# Patient Record
Sex: Female | Born: 1935
Health system: Southern US, Community
[De-identification: ages and names within clinical notes are randomized; demographics above are authoritative.]

## PROBLEM LIST (undated history)

## (undated) DIAGNOSIS — F329 Major depressive disorder, single episode, unspecified: Secondary | ICD-10-CM

## (undated) DIAGNOSIS — Z860100 Personal history of colon polyps, unspecified: Secondary | ICD-10-CM

## (undated) DIAGNOSIS — C439 Malignant melanoma of skin, unspecified: Secondary | ICD-10-CM

## (undated) DIAGNOSIS — M4800 Spinal stenosis, site unspecified: Secondary | ICD-10-CM

## (undated) DIAGNOSIS — Z923 Personal history of irradiation: Secondary | ICD-10-CM

## (undated) DIAGNOSIS — E7800 Pure hypercholesterolemia, unspecified: Secondary | ICD-10-CM

## (undated) DIAGNOSIS — N9489 Other specified conditions associated with female genital organs and menstrual cycle: Secondary | ICD-10-CM

## (undated) DIAGNOSIS — M1990 Unspecified osteoarthritis, unspecified site: Secondary | ICD-10-CM

## (undated) DIAGNOSIS — F32A Depression, unspecified: Secondary | ICD-10-CM

## (undated) DIAGNOSIS — N949 Unspecified condition associated with female genital organs and menstrual cycle: Secondary | ICD-10-CM

## (undated) DIAGNOSIS — I10 Essential (primary) hypertension: Secondary | ICD-10-CM

## (undated) DIAGNOSIS — C50919 Malignant neoplasm of unspecified site of unspecified female breast: Secondary | ICD-10-CM

## (undated) HISTORY — DX: Unspecified condition associated with female genital organs and menstrual cycle: N94.9

## (undated) HISTORY — DX: Essential (primary) hypertension: I10

## (undated) HISTORY — DX: Spinal stenosis, site unspecified: M48.00

## (undated) HISTORY — DX: Other specified conditions associated with female genital organs and menstrual cycle: N94.89

## (undated) HISTORY — DX: Pure hypercholesterolemia, unspecified: E78.00

## (undated) HISTORY — DX: Major depressive disorder, single episode, unspecified: F32.9

## (undated) HISTORY — DX: Unspecified osteoarthritis, unspecified site: M19.90

## (undated) HISTORY — DX: Depression, unspecified: F32.A

## (undated) HISTORY — DX: Personal history of colon polyps, unspecified: Z86.0100

## (undated) HISTORY — DX: Personal history of colonic polyps: Z86.010

## (undated) HISTORY — PX: EYE SURGERY: SHX253

## (undated) HISTORY — DX: Malignant neoplasm of unspecified site of unspecified female breast: C50.919

---

## 1993-03-08 DIAGNOSIS — C50919 Malignant neoplasm of unspecified site of unspecified female breast: Secondary | ICD-10-CM

## 1993-03-08 HISTORY — DX: Malignant neoplasm of unspecified site of unspecified female breast: C50.919

## 1993-03-08 HISTORY — PX: BREAST LUMPECTOMY: SHX2

## 1993-03-08 HISTORY — PX: BREAST BIOPSY: SHX20

## 2003-12-09 ENCOUNTER — Ambulatory Visit: Payer: Self-pay | Admitting: Internal Medicine

## 2004-03-08 HISTORY — PX: BREAST BIOPSY: SHX20

## 2004-06-03 ENCOUNTER — Ambulatory Visit: Payer: Self-pay | Admitting: Internal Medicine

## 2004-06-09 ENCOUNTER — Ambulatory Visit: Payer: Self-pay | Admitting: Surgery

## 2004-06-15 ENCOUNTER — Ambulatory Visit: Payer: Self-pay | Admitting: Surgery

## 2004-11-17 ENCOUNTER — Ambulatory Visit: Payer: Self-pay | Admitting: Surgery

## 2005-06-09 ENCOUNTER — Ambulatory Visit: Payer: Self-pay | Admitting: Surgery

## 2005-06-25 ENCOUNTER — Ambulatory Visit: Payer: Self-pay | Admitting: Unknown Physician Specialty

## 2006-01-05 ENCOUNTER — Ambulatory Visit: Payer: Self-pay | Admitting: Internal Medicine

## 2006-08-01 ENCOUNTER — Ambulatory Visit: Payer: Self-pay | Admitting: Surgery

## 2006-09-12 ENCOUNTER — Ambulatory Visit: Payer: Self-pay | Admitting: Unknown Physician Specialty

## 2007-08-04 ENCOUNTER — Ambulatory Visit: Payer: Self-pay | Admitting: Surgery

## 2007-09-13 ENCOUNTER — Ambulatory Visit: Payer: Self-pay | Admitting: Family Medicine

## 2008-04-05 ENCOUNTER — Inpatient Hospital Stay: Payer: Self-pay | Admitting: Psychiatry

## 2008-08-07 ENCOUNTER — Ambulatory Visit: Payer: Self-pay | Admitting: Surgery

## 2008-08-20 ENCOUNTER — Ambulatory Visit: Payer: Self-pay | Admitting: Surgery

## 2009-02-25 ENCOUNTER — Ambulatory Visit: Payer: Self-pay | Admitting: Surgery

## 2009-08-26 ENCOUNTER — Ambulatory Visit: Payer: Self-pay | Admitting: Surgery

## 2010-09-10 ENCOUNTER — Ambulatory Visit: Payer: Self-pay | Admitting: Surgery

## 2010-09-11 ENCOUNTER — Ambulatory Visit: Payer: Self-pay | Admitting: Surgery

## 2010-12-28 ENCOUNTER — Ambulatory Visit: Payer: Self-pay | Admitting: Surgery

## 2011-10-04 ENCOUNTER — Ambulatory Visit: Payer: Self-pay | Admitting: Family Medicine

## 2011-12-24 ENCOUNTER — Ambulatory Visit: Payer: Self-pay | Admitting: Unknown Physician Specialty

## 2012-10-04 ENCOUNTER — Ambulatory Visit: Payer: Self-pay | Admitting: Family Medicine

## 2012-12-06 ENCOUNTER — Ambulatory Visit (INDEPENDENT_AMBULATORY_CARE_PROVIDER_SITE_OTHER): Payer: Medicare Other | Admitting: Internal Medicine

## 2012-12-06 ENCOUNTER — Encounter: Payer: Self-pay | Admitting: Internal Medicine

## 2012-12-06 VITALS — BP 120/70 | HR 73 | Temp 98.3°F | Ht 62.25 in | Wt 125.5 lb

## 2012-12-06 DIAGNOSIS — Z86010 Personal history of colonic polyps: Secondary | ICD-10-CM

## 2012-12-06 DIAGNOSIS — E7800 Pure hypercholesterolemia, unspecified: Secondary | ICD-10-CM

## 2012-12-06 DIAGNOSIS — I10 Essential (primary) hypertension: Secondary | ICD-10-CM

## 2012-12-06 DIAGNOSIS — F329 Major depressive disorder, single episode, unspecified: Secondary | ICD-10-CM

## 2012-12-06 DIAGNOSIS — C50919 Malignant neoplasm of unspecified site of unspecified female breast: Secondary | ICD-10-CM

## 2012-12-06 DIAGNOSIS — IMO0002 Reserved for concepts with insufficient information to code with codable children: Secondary | ICD-10-CM

## 2012-12-06 DIAGNOSIS — M48061 Spinal stenosis, lumbar region without neurogenic claudication: Secondary | ICD-10-CM

## 2012-12-06 MED ORDER — LOVASTATIN 40 MG PO TABS: 40.0000 mg | ORAL_TABLET | Freq: Every day | ORAL | Status: DC

## 2012-12-09 ENCOUNTER — Encounter: Payer: Self-pay | Admitting: Internal Medicine

## 2012-12-09 DIAGNOSIS — I10 Essential (primary) hypertension: Secondary | ICD-10-CM | POA: Insufficient documentation

## 2012-12-09 DIAGNOSIS — IMO0002 Reserved for concepts with insufficient information to code with codable children: Secondary | ICD-10-CM | POA: Insufficient documentation

## 2012-12-09 DIAGNOSIS — E785 Hyperlipidemia, unspecified: Secondary | ICD-10-CM | POA: Insufficient documentation

## 2012-12-09 DIAGNOSIS — Z853 Personal history of malignant neoplasm of breast: Secondary | ICD-10-CM | POA: Insufficient documentation

## 2012-12-09 DIAGNOSIS — Z86010 Personal history of colonic polyps: Secondary | ICD-10-CM | POA: Insufficient documentation

## 2012-12-09 DIAGNOSIS — F320 Major depressive disorder, single episode, mild: Secondary | ICD-10-CM | POA: Insufficient documentation

## 2012-12-09 DIAGNOSIS — M48061 Spinal stenosis, lumbar region without neurogenic claudication: Secondary | ICD-10-CM | POA: Insufficient documentation

## 2012-12-09 NOTE — Assessment & Plan Note (Signed)
Colonoscopy 2013 - one polyp.  Followed by Dr [name].

## 2012-12-09 NOTE — Assessment & Plan Note (Signed)
S/p lumpectomy and XRT.  Has been released.  Last mammogram 10/04/12 - ok.

## 2012-12-09 NOTE — Assessment & Plan Note (Signed)
Some back pain and stiffness.  Sees Dr Sharlet Salina as needed.  On lyrica and has tramadol if needed.  Follow.

## 2012-12-09 NOTE — Assessment & Plan Note (Signed)
Controlled now on clonazepam and remeron.  Follow.

## 2012-12-09 NOTE — Assessment & Plan Note (Signed)
On lovastatin.  Low cholesterol diet.  Follow lipid panel and liver function.

## 2012-12-09 NOTE — Progress Notes (Signed)
Subjective:    [name], female    DOB: [birth year], 79 y.o.   MRN: [id number]  HPI 79 year old female with past history of hypertension, hypercholesterolemia, breast cancer, depression and lumbar spinal stenosis.  She comes in today to follow up on these issues as well as to establish care.  She was diagnosed with breast cancer 06/18/93.  S/p lumpectomy and XRT.  She also suffered from depression after her husband passed away in 06/18/05.  Previously saw Dr Marjory Lies and Dr Bridgett Larsson.  Was admitted to [facility] for four days.  Takes clonazepam and remeron and this controls things well.  She also has a pinched nerve and has been diagnosed with lumbar spinal stenosis and a bulging disc.  Has had 6 epidurals with no significant relief.  Sees Dr Sharlet Salina as needed.  Takes Lyrica and has tramadol if needed.  She has some back stiffness and this can affect her balance.  She is able to do the things she needs to do at home.  Overall she feels she is doing relatively well.     Past Medical History  Diagnosis Date  . Arthritis   . Breast cancer     1995, s/p XRT  . Depression   . Hypercholesterolemia   . Hypertension   . Hx of colonic polyp   . Spinal stenosis     lumbar    Outpatient Encounter Prescriptions as of 12/06/2012  Medication Sig Dispense Refill  . aspirin 81 MG tablet Take 81 mg by mouth daily.      . calcium-vitamin D (OSCAL WITH D) 500-200 MG-UNIT per tablet Take 2 tablets by mouth daily.      . clonazePAM (KLONOPIN) 0.5 MG tablet Take 0.5 mg by mouth daily as needed for anxiety.      Marland Kitchen lisinopril-hydrochlorothiazide (PRINZIDE,ZESTORETIC) 20-25 MG per tablet Take 1 tablet by mouth daily.      Marland Kitchen lovastatin (MEVACOR) 40 MG tablet Take 1-2 tablets (40-80 mg total) by mouth daily.  180 tablet  3  . mirtazapine (REMERON) 15 MG tablet Take 15 mg by mouth at bedtime.      . Multiple Vitamin (MULTIVITAMIN) tablet Take 1 tablet by mouth daily.      . Omega-3 Fatty Acids (FISH OIL)  1000 MG CAPS Take by mouth daily.      . pregabalin (LYRICA) 50 MG capsule Take 50 mg by mouth daily.      . traMADol (ULTRAM) 50 MG tablet Take 25-50 mg by mouth every 8 (eight) hours as needed for pain.      . [DISCONTINUED] lovastatin (MEVACOR) 40 MG tablet Take 40-80 mg by mouth daily.       No facility-administered encounter medications on file as of 12/06/2012.    Review of Systems Patient denies any headache, lightheadedness or dizziness.  Some minimal nasal stuffiness.  No significant sinus pressure.  No sore throat.  No chest pain, tightness or palpitations.  No increased shortness of breath, cough or congestion.  No nausea or vomiting.  No abdominal pain or cramping.  No bowel change, such as diarrhea, constipation, BRBPR or melana.  No urine change.   Back pain and stiffness as outlined.  Left hip is better.  Takes clonazepam and remeron.   Feels she is doing well on these medications.       Objective:   Physical Exam Filed Vitals:   12/06/12 0857  BP: 120/70  Pulse: 73  Temp: 98.3 F (36.8 C)  Pulse 44  79 year old female in no acute distress.   HEENT:  Nares- clear.  Oropharynx - without lesions. NECK:  Supple.  Nontender.  No audible bruit.  HEART:  Appears to be regular. LUNGS:  No crackles or wheezing audible.  Respirations even and unlabored.  RADIAL PULSE:  Equal bilaterally.  ABDOMEN:  Soft, nontender.  Bowel sounds present and normal.  No audible abdominal bruit.    EXTREMITIES:  No increased edema present.  DP pulses palpable and equal bilaterally.          Assessment & Plan:  HEALTH MAINTENANCE.  Obtain records for review.  Schedule a physical when due.  Has had Tdap and shingles vaccine.  Colonoscopy 2013 [name]).  Mammogram 10/04/12 - ok.    I spent 45 minutes with the patient and more than 50% of the time was spent in consultation regarding the above.

## 2012-12-09 NOTE — Assessment & Plan Note (Signed)
Persistent back pain and stiffness.  Has seen Dr Sharlet Salina.  S/p epidural injections.  No relief.  Sees Dr Sharlet Salina prn.  Has tramadol if needed.  On lyrica.

## 2012-12-09 NOTE — Assessment & Plan Note (Signed)
Blood pressure as outlined.  On lisinopril/hctz.  Follow metabolic panel.

## 2012-12-20 ENCOUNTER — Other Ambulatory Visit (INDEPENDENT_AMBULATORY_CARE_PROVIDER_SITE_OTHER): Payer: Medicare Other

## 2012-12-20 DIAGNOSIS — I10 Essential (primary) hypertension: Secondary | ICD-10-CM

## 2012-12-20 DIAGNOSIS — E7800 Pure hypercholesterolemia, unspecified: Secondary | ICD-10-CM

## 2012-12-20 DIAGNOSIS — C50919 Malignant neoplasm of unspecified site of unspecified female breast: Secondary | ICD-10-CM

## 2012-12-20 DIAGNOSIS — F329 Major depressive disorder, single episode, unspecified: Secondary | ICD-10-CM

## 2012-12-20 LAB — CBC WITH DIFFERENTIAL/PLATELET
Eosinophils Relative: 4.2 % (ref 0.0–5.0)
HCT: 41.5 % (ref 36.0–46.0)
Lymphocytes Relative: 28.4 % (ref 12.0–46.0)
Lymphs Abs: 1.7 10*3/uL (ref 0.7–4.0)
MCHC: 33.6 g/dL (ref 30.0–36.0)
Monocytes Relative: 8.2 % (ref 3.0–12.0)
Neutrophils Relative %: 58.8 % (ref 43.0–77.0)
Platelets: 227 10*3/uL (ref 150.0–400.0)
RDW: 13.1 % (ref 11.5–14.6)
WBC: 5.9 10*3/uL (ref 4.5–10.5)

## 2012-12-20 LAB — BASIC METABOLIC PANEL
BUN: 27 mg/dL — ABNORMAL HIGH (ref 6–23)
GFR: 34.46 mL/min — ABNORMAL LOW (ref >60.00)
Potassium: 4.4 mEq/L (ref 3.5–5.1)

## 2012-12-20 LAB — HEPATIC FUNCTION PANEL
ALT: 17 U/L (ref 0–35)
Alkaline Phosphatase: 55 U/L (ref 39–117)
Bilirubin, Direct: 0.1 mg/dL (ref 0.0–0.3)
Total Protein: 6 g/dL (ref 6.0–8.3)

## 2012-12-20 LAB — LIPID PANEL
Cholesterol: 188 mg/dL (ref 0–200)
HDL: 61 mg/dL (ref >39.00)
Triglycerides: 100 mg/dL (ref 0.0–149.0)
VLDL: 20 mg/dL (ref 0.0–40.0)

## 2012-12-20 LAB — TSH: TSH: 4.4 u[IU]/mL (ref 0.35–5.50)

## 2012-12-21 ENCOUNTER — Other Ambulatory Visit: Payer: Self-pay | Admitting: Internal Medicine

## 2012-12-21 DIAGNOSIS — N289 Disorder of kidney and ureter, unspecified: Secondary | ICD-10-CM

## 2012-12-21 NOTE — Progress Notes (Signed)
Order placed for f/u lab.   

## 2012-12-25 ENCOUNTER — Other Ambulatory Visit (INDEPENDENT_AMBULATORY_CARE_PROVIDER_SITE_OTHER): Payer: Medicare Other

## 2012-12-25 DIAGNOSIS — N289 Disorder of kidney and ureter, unspecified: Secondary | ICD-10-CM

## 2012-12-25 LAB — URINALYSIS, ROUTINE W REFLEX MICROSCOPIC
Hgb urine dipstick: NEGATIVE
Ketones, ur: NEGATIVE
Urine Glucose: NEGATIVE
Urobilinogen, UA: 0.2 (ref 0.0–1.0)

## 2012-12-25 LAB — BASIC METABOLIC PANEL
BUN: 18 mg/dL (ref 6–23)
Chloride: 101 mEq/L (ref 96–112)
GFR: 43.04 mL/min — ABNORMAL LOW (ref >60.00)
Glucose, Bld: 106 mg/dL — ABNORMAL HIGH (ref 70–99)
Potassium: 4.1 mEq/L (ref 3.5–5.1)

## 2012-12-26 ENCOUNTER — Other Ambulatory Visit: Payer: Self-pay | Admitting: Internal Medicine

## 2012-12-26 DIAGNOSIS — N289 Disorder of kidney and ureter, unspecified: Secondary | ICD-10-CM

## 2012-12-26 NOTE — Progress Notes (Signed)
Order placed for f/u met b.  

## 2012-12-31 ENCOUNTER — Encounter: Payer: Self-pay | Admitting: Internal Medicine

## 2012-12-31 DIAGNOSIS — N281 Cyst of kidney, acquired: Secondary | ICD-10-CM

## 2013-01-17 ENCOUNTER — Other Ambulatory Visit (INDEPENDENT_AMBULATORY_CARE_PROVIDER_SITE_OTHER): Payer: Medicare Other

## 2013-01-17 DIAGNOSIS — N289 Disorder of kidney and ureter, unspecified: Secondary | ICD-10-CM

## 2013-01-17 LAB — BASIC METABOLIC PANEL
BUN: 22 mg/dL (ref 6–23)
CO2: 31 mEq/L (ref 19–32)
Calcium: 9.6 mg/dL (ref 8.4–10.5)
Chloride: 98 mEq/L (ref 96–112)
Creatinine, Ser: 1.3 mg/dL — ABNORMAL HIGH (ref 0.4–1.2)

## 2013-01-18 ENCOUNTER — Encounter: Payer: Self-pay | Admitting: *Deleted

## 2013-03-12 ENCOUNTER — Ambulatory Visit (INDEPENDENT_AMBULATORY_CARE_PROVIDER_SITE_OTHER): Payer: Medicare Other | Admitting: Internal Medicine

## 2013-03-12 ENCOUNTER — Encounter: Payer: Self-pay | Admitting: Internal Medicine

## 2013-03-12 VITALS — BP 130/80 | HR 85 | Temp 98.5°F | Ht 62.25 in | Wt 128.8 lb

## 2013-03-12 DIAGNOSIS — R0989 Other specified symptoms and signs involving the circulatory and respiratory systems: Secondary | ICD-10-CM

## 2013-03-12 DIAGNOSIS — F329 Major depressive disorder, single episode, unspecified: Secondary | ICD-10-CM

## 2013-03-12 DIAGNOSIS — F32A Depression, unspecified: Secondary | ICD-10-CM

## 2013-03-12 DIAGNOSIS — Z860100 Personal history of colon polyps, unspecified: Secondary | ICD-10-CM

## 2013-03-12 DIAGNOSIS — E7800 Pure hypercholesterolemia, unspecified: Secondary | ICD-10-CM

## 2013-03-12 DIAGNOSIS — F3289 Other specified depressive episodes: Secondary | ICD-10-CM

## 2013-03-12 DIAGNOSIS — M48061 Spinal stenosis, lumbar region without neurogenic claudication: Secondary | ICD-10-CM

## 2013-03-12 DIAGNOSIS — I10 Essential (primary) hypertension: Secondary | ICD-10-CM

## 2013-03-12 DIAGNOSIS — N289 Disorder of kidney and ureter, unspecified: Secondary | ICD-10-CM

## 2013-03-12 DIAGNOSIS — C50919 Malignant neoplasm of unspecified site of unspecified female breast: Secondary | ICD-10-CM

## 2013-03-12 MED ORDER — MIRTAZAPINE 15 MG PO TABS: 15.0000 mg | ORAL_TABLET | Freq: Every day | ORAL | Status: DC

## 2013-03-12 MED ORDER — CLONAZEPAM 0.5 MG PO TABS: ORAL_TABLET | ORAL | Status: DC

## 2013-03-12 MED ORDER — LISINOPRIL-HYDROCHLOROTHIAZIDE 20-25 MG PO TABS: 1.0000 | ORAL_TABLET | Freq: Every day | ORAL | Status: DC

## 2013-03-12 NOTE — Progress Notes (Signed)
Pre-visit discussion using our clinic review tool. No additional management support is needed unless otherwise documented below in the visit note.  

## 2013-03-12 NOTE — Progress Notes (Signed)
Subjective:    [name], female    DOB: March 11, 1930, 80 y.o.   MRN: [id number]  HPI 81 year old female with past history of hypertension, hypercholesterolemia, breast cancer, depression and lumbar spinal stenosis.  She comes in today to follow up on these issues as well as for a complete physical exam.   She was diagnosed with breast cancer 1995.  S/p lumpectomy and XRT.  She also suffered from depression after her husband passed away in 04-Jun-2005.  Previously saw Dr Marjory Lies and Dr Bridgett Larsson.  Was admitted to [facility] for four days.  Takes clonazepam and remeron and this controls things well.  Has been on this regimen for years with no change.  She also has a pinched nerve and has been diagnosed with lumbar spinal stenosis and a bulging disc.  Has had 6 epidurals with no significant relief.  Sees Dr Sharlet Salina as needed.  Takes Lyrica and has tramadol if needed.  Does not take the tramadol often. She has some back stiffness.   She is able to do the things she needs to do at home.  Overall she feels she is doing relatively well.     Past Medical History  Diagnosis Date  . Arthritis   . Breast cancer     1995, s/p XRT  . Depression   . Hypercholesterolemia   . Hypertension   . Hx of colonic polyp   . Spinal stenosis     lumbar    Outpatient Encounter Prescriptions as of 03/12/2013  Medication Sig  . aspirin 81 MG tablet Take 81 mg by mouth daily.  . calcium-vitamin D (OSCAL WITH D) 500-200 MG-UNIT per tablet Take 2 tablets by mouth daily.  . clonazePAM (KLONOPIN) 0.5 MG tablet Take 0.5 mg by mouth daily as needed for anxiety.  Marland Kitchen lisinopril-hydrochlorothiazide (PRINZIDE,ZESTORETIC) 20-25 MG per tablet Take 1 tablet by mouth daily.  Marland Kitchen lovastatin (MEVACOR) 40 MG tablet Take 1-2 tablets (40-80 mg total) by mouth daily.  . mirtazapine (REMERON) 15 MG tablet Take 15 mg by mouth at bedtime.  . Multiple Vitamin (MULTIVITAMIN) tablet Take 1 tablet by mouth daily.  . Omega-3 Fatty Acids  (FISH OIL) 1000 MG CAPS Take by mouth daily.  . pregabalin (LYRICA) 50 MG capsule Take 50 mg by mouth daily.  . traMADol (ULTRAM) 50 MG tablet Take 25-50 mg by mouth every 8 (eight) hours as needed for pain.  . [DISCONTINUED] lisinopril-hydrochlorothiazide (PRINZIDE,ZESTORETIC) 20-25 MG per tablet Take 1 tablet by mouth daily.    Review of Systems Patient denies any headache, lightheadedness or dizziness.  No significant sinus or allergy symptoms now.   No sore throat.  No chest pain, tightness or palpitations.  No increased shortness of breath, cough or congestion.  No nausea or vomiting.  No acid reflux.  No abdominal pain or cramping.  No bowel change, such as diarrhea, constipation, BRBPR or melana.  No urine change.   Back pain and stiffness as outlined.  Left hip is better.  Takes clonazepam and remeron.   Feels she is doing well on these medications.       Objective:   Physical Exam  Filed Vitals:   03/12/13 1530  BP: 130/80  Pulse: 85  Temp: 98.5 F (6.63 C)   80 year old female in no acute distress.   HEENT:  Nares- clear.  Oropharynx - without lesions. NECK:  Supple.  Nontender.  No audible bruit.  HEART:  Appears to be regular.  Question  of I/VI systolic murmur.  LUNGS:  No crackles or wheezing audible.  Respirations even and unlabored.  RADIAL PULSE:  Equal bilaterally.    BREASTS:  No nipple discharge or nipple retraction present.  Could not appreciate any distinct nodules or axillary adenopathy.  ABDOMEN:  Soft, nontender.  Bowel sounds present and normal.  Audible abdominal bruit and question of femoral bruit. GU:  Not performed.    EXTREMITIES:  No increased edema present.  DP pulses palpable and equal bilaterally.      Assessment & Plan:  HEALTH MAINTENANCE.  Physical today.  Has had Tdap and shingles vaccine.  Colonoscopy 2013 [name]).  Mammogram 10/04/12 - ok.

## 2013-03-14 ENCOUNTER — Encounter: Payer: Self-pay | Admitting: Internal Medicine

## 2013-03-14 DIAGNOSIS — R0989 Other specified symptoms and signs involving the circulatory and respiratory systems: Secondary | ICD-10-CM | POA: Insufficient documentation

## 2013-03-14 DIAGNOSIS — N183 Chronic kidney disease, stage 3 unspecified: Secondary | ICD-10-CM | POA: Insufficient documentation

## 2013-03-14 NOTE — Assessment & Plan Note (Signed)
On lovastatin.  Low cholesterol diet.  Follow lipid panel and liver function.   Last FLP revealed total cholesterol 188, triglycerides 100, HDL 61 and LDl 107.

## 2013-03-14 NOTE — Assessment & Plan Note (Signed)
S/p lumpectomy and XRT.  Has been released.  Last mammogram 10/04/12 - ok.

## 2013-03-14 NOTE — Assessment & Plan Note (Signed)
Cr checked 01/17/13 - 1.3.  In reviewing records - stable.  Urinalysis negative for protein.  Given the renal insufficiency and the bruit, will check renal ultrasound to evaluated kidney size ,etc.

## 2013-03-14 NOTE — Assessment & Plan Note (Signed)
Controlled now on clonazepam and remeron.  Follow.

## 2013-03-14 NOTE — Assessment & Plan Note (Signed)
Persistent back pain and stiffness.  Has seen Dr Sharlet Salina.  S/p epidural injections.  No relief.  Sees Dr Sharlet Salina prn.  Has tramadol if needed.  On lyrica.

## 2013-03-14 NOTE — Assessment & Plan Note (Signed)
Colonoscopy 2013 - one polyp.  Followed by Dr [name].

## 2013-03-14 NOTE — Assessment & Plan Note (Signed)
Need to check aortic ultrasound and renal ultrasound.  See above.

## 2013-03-14 NOTE — Assessment & Plan Note (Signed)
Blood pressure as outlined.  On lisinopril/hctz.  Follow metabolic panel.

## 2013-03-16 ENCOUNTER — Ambulatory Visit: Payer: Self-pay | Admitting: Internal Medicine

## 2013-03-21 ENCOUNTER — Telehealth: Payer: Self-pay | Admitting: Internal Medicine

## 2013-03-21 DIAGNOSIS — N281 Cyst of kidney, acquired: Secondary | ICD-10-CM

## 2013-03-21 NOTE — Telephone Encounter (Signed)
Calling for Ultra sound results.

## 2013-03-21 NOTE — Telephone Encounter (Signed)
Notify pt that there is no aneurysm.  No acute abnormality.  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  I do recommend a f/u ultrasound in 6 months - to confirm stable.

## 2013-03-22 ENCOUNTER — Encounter: Payer: Self-pay | Admitting: Emergency Medicine

## 2013-03-22 DIAGNOSIS — N281 Cyst of kidney, acquired: Secondary | ICD-10-CM | POA: Insufficient documentation

## 2013-03-22 NOTE — Telephone Encounter (Signed)
Pt notified & okay to proceed with repeat U/S in 6 months. Pt also requested that a copy of her reports be mailed to her for her records.

## 2013-03-22 NOTE — Telephone Encounter (Signed)
Ultrasound ordered.  Dr cope reviewed.  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  Recommend 6 month f/u.

## 2013-04-16 ENCOUNTER — Telehealth: Payer: Self-pay | Admitting: *Deleted

## 2013-04-16 DIAGNOSIS — I10 Essential (primary) hypertension: Secondary | ICD-10-CM

## 2013-04-16 DIAGNOSIS — E7800 Pure hypercholesterolemia, unspecified: Secondary | ICD-10-CM

## 2013-04-16 DIAGNOSIS — N289 Disorder of kidney and ureter, unspecified: Secondary | ICD-10-CM

## 2013-04-16 NOTE — Telephone Encounter (Signed)
Pt is coming in for labs tomorrow what labs and dx?  

## 2013-04-17 ENCOUNTER — Other Ambulatory Visit (INDEPENDENT_AMBULATORY_CARE_PROVIDER_SITE_OTHER): Payer: Medicare Other

## 2013-04-17 ENCOUNTER — Encounter (INDEPENDENT_AMBULATORY_CARE_PROVIDER_SITE_OTHER): Payer: Self-pay

## 2013-04-17 DIAGNOSIS — E7800 Pure hypercholesterolemia, unspecified: Secondary | ICD-10-CM

## 2013-04-17 DIAGNOSIS — N289 Disorder of kidney and ureter, unspecified: Secondary | ICD-10-CM

## 2013-04-17 LAB — LIPID PANEL
Cholesterol: 215 mg/dL — ABNORMAL HIGH (ref 0–200)
HDL: 66.6 mg/dL (ref >39.00)
Total CHOL/HDL Ratio: 3
Triglycerides: 196 mg/dL — ABNORMAL HIGH (ref 0.0–149.0)
VLDL: 39.2 mg/dL (ref 0.0–40.0)

## 2013-04-17 LAB — HEPATIC FUNCTION PANEL
ALT: 21 U/L (ref 0–35)
AST: 24 U/L (ref 0–37)
Albumin: 3.8 g/dL (ref 3.5–5.2)
Alkaline Phosphatase: 56 U/L (ref 39–117)
BILIRUBIN DIRECT: 0 mg/dL (ref 0.0–0.3)
TOTAL PROTEIN: 6.5 g/dL (ref 6.0–8.3)
Total Bilirubin: 0.5 mg/dL (ref 0.3–1.2)

## 2013-04-17 LAB — BASIC METABOLIC PANEL
BUN: 27 mg/dL — ABNORMAL HIGH (ref 6–23)
CALCIUM: 9.5 mg/dL (ref 8.4–10.5)
CO2: 30 mEq/L (ref 19–32)
Chloride: 105 mEq/L (ref 96–112)
Creatinine, Ser: 1.4 mg/dL — ABNORMAL HIGH (ref 0.4–1.2)
GFR: 39.41 mL/min — AB (ref >60.00)
Glucose, Bld: 102 mg/dL — ABNORMAL HIGH (ref 70–99)
Potassium: 4.3 mEq/L (ref 3.5–5.1)
SODIUM: 141 meq/L (ref 135–145)

## 2013-04-17 LAB — LDL CHOLESTEROL, DIRECT: Direct LDL: 126.7 mg/dL

## 2013-04-17 NOTE — Telephone Encounter (Signed)
Orders placed for labs

## 2013-05-04 ENCOUNTER — Ambulatory Visit: Payer: Medicare Other | Admitting: Internal Medicine

## 2013-05-07 ENCOUNTER — Encounter: Payer: Self-pay | Admitting: Internal Medicine

## 2013-05-21 ENCOUNTER — Encounter: Payer: Self-pay | Admitting: Internal Medicine

## 2013-05-21 ENCOUNTER — Ambulatory Visit (INDEPENDENT_AMBULATORY_CARE_PROVIDER_SITE_OTHER): Payer: Medicare Other | Admitting: Internal Medicine

## 2013-05-21 VITALS — BP 128/70 | HR 73 | Temp 98.2°F | Ht 62.25 in | Wt 133.0 lb

## 2013-05-21 DIAGNOSIS — Q619 Cystic kidney disease, unspecified: Secondary | ICD-10-CM

## 2013-05-21 DIAGNOSIS — Z860100 Personal history of colon polyps, unspecified: Secondary | ICD-10-CM

## 2013-05-21 DIAGNOSIS — I10 Essential (primary) hypertension: Secondary | ICD-10-CM

## 2013-05-21 DIAGNOSIS — F32A Depression, unspecified: Secondary | ICD-10-CM

## 2013-05-21 DIAGNOSIS — N281 Cyst of kidney, acquired: Secondary | ICD-10-CM

## 2013-05-21 DIAGNOSIS — F3289 Other specified depressive episodes: Secondary | ICD-10-CM

## 2013-05-21 DIAGNOSIS — C50919 Malignant neoplasm of unspecified site of unspecified female breast: Secondary | ICD-10-CM

## 2013-05-21 DIAGNOSIS — M48061 Spinal stenosis, lumbar region without neurogenic claudication: Secondary | ICD-10-CM

## 2013-05-21 DIAGNOSIS — F329 Major depressive disorder, single episode, unspecified: Secondary | ICD-10-CM

## 2013-05-21 DIAGNOSIS — N289 Disorder of kidney and ureter, unspecified: Secondary | ICD-10-CM

## 2013-05-21 DIAGNOSIS — E7800 Pure hypercholesterolemia, unspecified: Secondary | ICD-10-CM

## 2013-05-21 MED ORDER — LISINOPRIL-HYDROCHLOROTHIAZIDE 20-12.5 MG PO TABS: 1.0000 | ORAL_TABLET | Freq: Every day | ORAL | Status: DC

## 2013-05-21 NOTE — Progress Notes (Signed)
Pre-visit discussion using our clinic review tool. No additional management support is needed unless otherwise documented below in the visit note.  

## 2013-05-21 NOTE — Progress Notes (Signed)
Subjective:    [name], female    DOB: 1930-10-17, 80 y.o.   MRN: [id number]  HPI 80 year old female with past history of hypertension, hypercholesterolemia, breast cancer, depression and lumbar spinal stenosis.  She comes in today for a scheduled follow up.   She was diagnosed with breast cancer 06/01/1993.  S/p lumpectomy and XRT.  She also suffered from depression after her husband passed away in 01-Jun-2005.  Previously saw Dr Marjory Lies and Dr Bridgett Larsson.  Was admitted to [facility] for four days.  Takes clonazepam and remeron and this controls things well.  Has been on this regimen for years with no change.  She also has a pinched nerve and has been diagnosed with lumbar spinal stenosis and a bulging disc.  Has had 6 epidurals with no significant relief.  Sees Dr Sharlet Salina as needed.  Takes Lyrica and has tramadol if needed.  Does not take the tramadol often. She has some back stiffness.   She is able to do the things she needs to do at home.  Overall she feels she is doing relatively well.  Cr has been stable but slightly increased.     Past Medical History  Diagnosis Date  . Arthritis   . Breast cancer     1995, s/p XRT  . Depression   . Hypercholesterolemia   . Hypertension   . Hx of colonic polyp   . Spinal stenosis     lumbar    Outpatient Encounter Prescriptions as of 05/21/2013  Medication Sig  . aspirin 81 MG tablet Take 81 mg by mouth daily.  . calcium-vitamin D (OSCAL WITH D) 500-200 MG-UNIT per tablet Take 2 tablets by mouth daily.  . clonazePAM (KLONOPIN) 0.5 MG tablet Take one tablet q day to bid prn  . lisinopril-hydrochlorothiazide (PRINZIDE,ZESTORETIC) 20-25 MG per tablet Take 1 tablet by mouth daily.  Marland Kitchen lovastatin (MEVACOR) 40 MG tablet Take 1-2 tablets (40-80 mg total) by mouth daily.  . mirtazapine (REMERON) 15 MG tablet Take 1 tablet (15 mg total) by mouth at bedtime.  . Multiple Vitamin (MULTIVITAMIN) tablet Take 1 tablet by mouth daily.  . Omega-3 Fatty Acids  (FISH OIL) 1000 MG CAPS Take by mouth daily.  . pregabalin (LYRICA) 50 MG capsule Take 50 mg by mouth daily.  . traMADol (ULTRAM) 50 MG tablet Take 25-50 mg by mouth every 8 (eight) hours as needed for pain.    Review of Systems Patient denies any headache, lightheadedness or dizziness.  No significant sinus or allergy symptoms.  No sore throat.  No chest pain, tightness or palpitations.  No increased shortness of breath, cough or congestion.  No nausea or vomiting.  No acid reflux.  No abdominal pain or cramping.  No bowel change, such as diarrhea, constipation, BRBPR or melana.  No urine change.   Takes clonazepam and remeron.   Feels she is doing well on these medications.  Back stable.       Objective:   Physical Exam  Filed Vitals:   05/21/13 1020  BP: 128/70  Pulse: 73  Temp: 98.2 F (53.27 C)   80 year old female in no acute distress.   HEENT:  Nares- clear.  Oropharynx - without lesions. NECK:  Supple.  Nontender.  No audible bruit.  HEART:  Appears to be regular.  Question of I/VI systolic murmur.  LUNGS:  No crackles or wheezing audible.  Respirations even and unlabored.  RADIAL PULSE:  Equal bilaterally.  ABDOMEN:  Soft, nontender.  Bowel sounds present and normal.  Audible abdominal bruit and question of femoral bruit.   EXTREMITIES:  No increased edema present.  DP pulses palpable and equal bilaterally.      Assessment & Plan:  HEALTH MAINTENANCE.  Physical 03/12/13.   Has had Tdap and shingles vaccine.  Colonoscopy 2013 [name]).  Mammogram 10/04/12 - ok.

## 2013-05-21 NOTE — Assessment & Plan Note (Addendum)
Cr checked -1.3 - 1.4.  In reviewing records - stable.  Urinalysis negative for protein.  Given the renal insufficiency and the bruit, renal ultrasound performed.  Results as outlined.  Dr Jacqlyn Larsen reviewed.  Recommended f/u renal ultrasound in 6 months from last.  Decrease lisinopril/hctz to 20/12.5.  Follow Cr.  Stay hydrated.

## 2013-05-26 ENCOUNTER — Encounter: Payer: Self-pay | Admitting: Internal Medicine

## 2013-05-26 NOTE — Assessment & Plan Note (Signed)
Colonoscopy 2013 - one polyp.  Followed by Dr [name].

## 2013-05-26 NOTE — Assessment & Plan Note (Addendum)
Blood pressure as outlined.  On lisinopril/hctz. Given slight increase in creatinine, will change lisinoptil/hctz to 20/12.5.   Follow metabolic panel - cr.

## 2013-05-26 NOTE — Assessment & Plan Note (Signed)
Renal ultrasound (03/16/13) as outlined.  Cr Cope reviewed.  Felt to be a probable benign fatty tumor.  Recommended f/u renal ultrasound in 6 months.

## 2013-05-26 NOTE — Assessment & Plan Note (Signed)
S/p lumpectomy and XRT.  Has been released.  Last mammogram 10/04/12 - ok.

## 2013-05-26 NOTE — Assessment & Plan Note (Signed)
On lovastatin.  Low cholesterol diet.  Follow lipid panel and liver function.

## 2013-05-26 NOTE — Assessment & Plan Note (Signed)
Controlled now on clonazepam and remeron.  Follow.

## 2013-05-26 NOTE — Assessment & Plan Note (Signed)
Persistent back pain and stiffness.  Has seen Dr Sharlet Salina.  S/p epidural injections.  No relief.  Sees Dr Sharlet Salina prn.  Has tramadol if needed.  On lyrica.

## 2013-07-12 ENCOUNTER — Encounter: Payer: Self-pay | Admitting: *Deleted

## 2013-07-12 ENCOUNTER — Other Ambulatory Visit (INDEPENDENT_AMBULATORY_CARE_PROVIDER_SITE_OTHER): Payer: Medicare Other

## 2013-07-12 DIAGNOSIS — N289 Disorder of kidney and ureter, unspecified: Secondary | ICD-10-CM

## 2013-07-12 DIAGNOSIS — E7800 Pure hypercholesterolemia, unspecified: Secondary | ICD-10-CM

## 2013-07-12 LAB — HEPATIC FUNCTION PANEL
ALT: 16 U/L (ref 0–35)
AST: 21 U/L (ref 0–37)
Albumin: 4 g/dL (ref 3.5–5.2)
Alkaline Phosphatase: 59 U/L (ref 39–117)
BILIRUBIN DIRECT: 0.1 mg/dL (ref 0.0–0.3)
BILIRUBIN TOTAL: 1 mg/dL (ref 0.2–1.2)
Total Protein: 6.5 g/dL (ref 6.0–8.3)

## 2013-07-12 LAB — LIPID PANEL
CHOL/HDL RATIO: 3
CHOLESTEROL: 189 mg/dL (ref 0–200)
HDL: 62.4 mg/dL (ref >39.00)
LDL Cholesterol: 106 mg/dL — ABNORMAL HIGH (ref 0–99)
Triglycerides: 102 mg/dL (ref 0.0–149.0)
VLDL: 20.4 mg/dL (ref 0.0–40.0)

## 2013-07-12 LAB — BASIC METABOLIC PANEL
BUN: 20 mg/dL (ref 6–23)
CALCIUM: 9.4 mg/dL (ref 8.4–10.5)
CO2: 28 mEq/L (ref 19–32)
CREATININE: 1.5 mg/dL — AB (ref 0.4–1.2)
Chloride: 101 mEq/L (ref 96–112)
GFR: 34.67 mL/min — ABNORMAL LOW (ref >60.00)
Glucose, Bld: 97 mg/dL (ref 70–99)
Potassium: 4 mEq/L (ref 3.5–5.1)
Sodium: 137 mEq/L (ref 135–145)

## 2013-07-16 ENCOUNTER — Ambulatory Visit (INDEPENDENT_AMBULATORY_CARE_PROVIDER_SITE_OTHER): Payer: Medicare Other | Admitting: Internal Medicine

## 2013-07-16 ENCOUNTER — Encounter: Payer: Self-pay | Admitting: Internal Medicine

## 2013-07-16 VITALS — BP 122/70 | HR 94 | Temp 98.7°F | Ht 62.25 in | Wt 132.0 lb

## 2013-07-16 DIAGNOSIS — Q619 Cystic kidney disease, unspecified: Secondary | ICD-10-CM

## 2013-07-16 DIAGNOSIS — F3289 Other specified depressive episodes: Secondary | ICD-10-CM

## 2013-07-16 DIAGNOSIS — N289 Disorder of kidney and ureter, unspecified: Secondary | ICD-10-CM

## 2013-07-16 DIAGNOSIS — I10 Essential (primary) hypertension: Secondary | ICD-10-CM

## 2013-07-16 DIAGNOSIS — M48061 Spinal stenosis, lumbar region without neurogenic claudication: Secondary | ICD-10-CM

## 2013-07-16 DIAGNOSIS — E7800 Pure hypercholesterolemia, unspecified: Secondary | ICD-10-CM

## 2013-07-16 DIAGNOSIS — F329 Major depressive disorder, single episode, unspecified: Secondary | ICD-10-CM

## 2013-07-16 DIAGNOSIS — N281 Cyst of kidney, acquired: Secondary | ICD-10-CM

## 2013-07-16 DIAGNOSIS — C50919 Malignant neoplasm of unspecified site of unspecified female breast: Secondary | ICD-10-CM

## 2013-07-16 DIAGNOSIS — Z86010 Personal history of colonic polyps: Secondary | ICD-10-CM

## 2013-07-16 DIAGNOSIS — F32A Depression, unspecified: Secondary | ICD-10-CM

## 2013-07-16 NOTE — Progress Notes (Signed)
Pre visit review using our clinic review tool, if applicable. No additional management support is needed unless otherwise documented below in the visit note. 

## 2013-07-16 NOTE — Assessment & Plan Note (Addendum)
Blood pressure as outlined.  Given increase in creatinine,  lisinoptil/hctz was changed to 20/12.5.   Blood pressure has been averaging 128-142/70-80s.  Follow metabolic panel - cr.  Follow pressures.  Continue current medication regimen.

## 2013-07-16 NOTE — Progress Notes (Signed)
Subjective:    [name], female    DOB: Feb 23, 1931, 80 y.o.   MRN: [id number]  HPI 80 year old female with past history of hypertension, hypercholesterolemia, breast cancer, depression and lumbar spinal stenosis.  She comes in today for a scheduled follow up.   She was diagnosed with breast cancer 17-Jun-1993.  S/p lumpectomy and XRT.  She also suffered from depression after her husband passed away in 06-17-2005.  Previously saw Dr Marjory Lies and Dr Bridgett Larsson.  Was admitted to [facility] for four days.  Takes clonazepam and remeron and this controls things well.  Has been on this regimen for years with no change.  She also has a pinched nerve and has been diagnosed with lumbar spinal stenosis and a bulging disc.  Has had 6 epidurals with no significant relief.  Sees Dr Sharlet Salina as needed.  Takes Lyrica and has tramadol if needed.  Does not take the tramadol often. She has some back stiffness.   She is able to do the things she needs to do at home.  Overall she feels things are stable with her back.  Cr has been increased.  Last visit, I adjusted her blood pressure medication.  Decreased the HCTZ portion.     Past Medical History  Diagnosis Date  . Arthritis   . Breast cancer     1995, s/p XRT  . Depression   . Hypercholesterolemia   . Hypertension   . Hx of colonic polyp   . Spinal stenosis     lumbar    Outpatient Encounter Prescriptions as of 07/16/2013  Medication Sig  . aspirin 81 MG tablet Take 81 mg by mouth daily.  . calcium-vitamin D (OSCAL WITH D) 500-200 MG-UNIT per tablet Take 2 tablets by mouth daily.  . clonazePAM (KLONOPIN) 0.5 MG tablet Take one tablet q day to bid prn  . lisinopril-hydrochlorothiazide (ZESTORETIC) 20-12.5 MG per tablet Take 1 tablet by mouth daily.  Marland Kitchen lovastatin (MEVACOR) 40 MG tablet Take 1-2 tablets (40-80 mg total) by mouth daily.  . mirtazapine (REMERON) 15 MG tablet Take 1 tablet (15 mg total) by mouth at bedtime.  . Multiple Vitamin (MULTIVITAMIN)  tablet Take 1 tablet by mouth daily.  . pregabalin (LYRICA) 50 MG capsule Take 50 mg by mouth daily.  . Red Yeast Rice Extract (RED YEAST RICE PO) Take by mouth.  . traMADol (ULTRAM) 50 MG tablet Take 25-50 mg by mouth every 8 (eight) hours as needed for pain.  . [DISCONTINUED] Omega-3 Fatty Acids (FISH OIL) 1000 MG CAPS Take by mouth daily.    Review of Systems Patient denies any headache, lightheadedness or dizziness.  No significant sinus or allergy symptoms.  No sore throat.  No chest pain, tightness or palpitations.  No increased shortness of breath, cough or congestion.  No nausea or vomiting.  No acid reflux.  No abdominal pain or cramping.  No bowel change, such as diarrhea, constipation, BRBPR or melana.  No urine change.   Takes clonazepam and remeron.   Feels she is doing well on these medications.  Back stable.       Objective:   Physical Exam  Filed Vitals:   07/16/13 1436  BP: 122/70  Pulse: 94  Temp: 98.7 F (37.1 C)   Blood pressure recheck by me:  58/78  80 year old female in no acute distress.   HEENT:  Nares- clear.  Oropharynx - without lesions. NECK:  Supple.  Nontender.  No audible bruit.  HEART:  Appears to be regular.  Question of I/VI systolic murmur.  LUNGS:  No crackles or wheezing audible.  Respirations even and unlabored.  RADIAL PULSE:  Equal bilaterally.  ABDOMEN:  Soft, nontender.  Bowel sounds present and normal.  Audible abdominal bruit and question of femoral bruit.   EXTREMITIES:  No increased edema present.  DP pulses palpable and equal bilaterally.      Assessment & Plan:  HEALTH MAINTENANCE.  Physical 03/12/13.   Has had Tdap and shingles vaccine.  Colonoscopy 2013 [name]).  Mammogram 10/04/12 - ok.    I spent 25 minutes with the patient and more than 50% of the time was spent in consultation regarding the above.

## 2013-07-23 ENCOUNTER — Encounter: Payer: Self-pay | Admitting: Internal Medicine

## 2013-07-23 NOTE — Assessment & Plan Note (Signed)
Renal ultrasound (03/16/13) as outlined.  Cr Cope reviewed.  Felt to be a probable benign fatty tumor.  Recommended f/u renal ultrasound in 6 months from last.  Is scheduled.  Refer to nephrology as outlined.

## 2013-07-23 NOTE — Assessment & Plan Note (Signed)
Controlled now on clonazepam and remeron.  Follow.

## 2013-07-23 NOTE — Assessment & Plan Note (Signed)
Persistent back pain and stiffness.  Has seen Dr Sharlet Salina.  S/p epidural injections.  No relief.  Sees Dr Sharlet Salina prn.  Has tramadol if needed.  On lyrica.

## 2013-07-23 NOTE — Assessment & Plan Note (Signed)
S/p lumpectomy and XRT.  Has been released.  Last mammogram 10/04/12 - ok.

## 2013-07-23 NOTE — Assessment & Plan Note (Signed)
Colonoscopy 2013 - one polyp.  Followed by Dr [name].

## 2013-07-23 NOTE — Assessment & Plan Note (Signed)
Cr checked -1.3 - 1.4.  In reviewing records - stable.  Urinalysis negative for protein.  Given the renal insufficiency and the bruit, renal ultrasound performed.  Results reviewed.  Dr Jacqlyn Larsen reviewed.  Recommended f/u renal ultrasound in 6 months from last.  We decreased lisinopril/hctz to 20/12.5.  Follow Cr.  Stay hydrated.  Given persistent elevation, will have nephrology review.  Refer to nephrology for further evaluation.

## 2013-07-23 NOTE — Assessment & Plan Note (Signed)
On lovastatin.  Low cholesterol diet.  Follow lipid panel and liver function.

## 2013-08-06 ENCOUNTER — Other Ambulatory Visit: Payer: Self-pay | Admitting: *Deleted

## 2013-08-06 MED ORDER — CLONAZEPAM 0.5 MG PO TABS: ORAL_TABLET | ORAL | Status: DC

## 2013-08-06 NOTE — Telephone Encounter (Signed)
Ok refill? 

## 2013-08-06 NOTE — Telephone Encounter (Signed)
Rx faxed to pharmacy  

## 2013-08-06 NOTE — Telephone Encounter (Signed)
Refilled clonazepam #135 with no refills.

## 2013-08-22 ENCOUNTER — Encounter: Payer: Self-pay | Admitting: Internal Medicine

## 2013-09-19 ENCOUNTER — Ambulatory Visit: Payer: Self-pay | Admitting: Internal Medicine

## 2013-09-21 ENCOUNTER — Encounter: Payer: Self-pay | Admitting: Internal Medicine

## 2013-10-08 ENCOUNTER — Ambulatory Visit: Payer: Self-pay | Admitting: Internal Medicine

## 2013-10-08 ENCOUNTER — Encounter: Payer: Self-pay | Admitting: *Deleted

## 2013-10-08 LAB — HM MAMMOGRAPHY: HM MAMMO: NEGATIVE

## 2013-10-19 ENCOUNTER — Ambulatory Visit (INDEPENDENT_AMBULATORY_CARE_PROVIDER_SITE_OTHER): Payer: Medicare Other | Admitting: Internal Medicine

## 2013-10-19 ENCOUNTER — Encounter: Payer: Self-pay | Admitting: Internal Medicine

## 2013-10-19 VITALS — BP 120/70 | HR 76 | Temp 98.4°F | Ht 62.25 in | Wt 127.2 lb

## 2013-10-19 DIAGNOSIS — R5381 Other malaise: Secondary | ICD-10-CM

## 2013-10-19 DIAGNOSIS — F329 Major depressive disorder, single episode, unspecified: Secondary | ICD-10-CM

## 2013-10-19 DIAGNOSIS — N183 Chronic kidney disease, stage 3 unspecified: Secondary | ICD-10-CM

## 2013-10-19 DIAGNOSIS — F3289 Other specified depressive episodes: Secondary | ICD-10-CM

## 2013-10-19 DIAGNOSIS — F32A Depression, unspecified: Secondary | ICD-10-CM

## 2013-10-19 DIAGNOSIS — M48061 Spinal stenosis, lumbar region without neurogenic claudication: Secondary | ICD-10-CM

## 2013-10-19 DIAGNOSIS — Z86010 Personal history of colonic polyps: Secondary | ICD-10-CM

## 2013-10-19 DIAGNOSIS — Q619 Cystic kidney disease, unspecified: Secondary | ICD-10-CM

## 2013-10-19 DIAGNOSIS — E7800 Pure hypercholesterolemia, unspecified: Secondary | ICD-10-CM

## 2013-10-19 DIAGNOSIS — R5383 Other fatigue: Principal | ICD-10-CM

## 2013-10-19 DIAGNOSIS — I10 Essential (primary) hypertension: Secondary | ICD-10-CM

## 2013-10-19 DIAGNOSIS — N281 Cyst of kidney, acquired: Secondary | ICD-10-CM

## 2013-10-19 DIAGNOSIS — C50919 Malignant neoplasm of unspecified site of unspecified female breast: Secondary | ICD-10-CM

## 2013-10-19 DIAGNOSIS — L989 Disorder of the skin and subcutaneous tissue, unspecified: Secondary | ICD-10-CM

## 2013-10-19 MED ORDER — MIRTAZAPINE 15 MG PO TABS: 15.0000 mg | ORAL_TABLET | Freq: Every day | ORAL | Status: DC

## 2013-10-21 ENCOUNTER — Encounter: Payer: Self-pay | Admitting: Internal Medicine

## 2013-10-21 DIAGNOSIS — R5381 Other malaise: Secondary | ICD-10-CM | POA: Insufficient documentation

## 2013-10-21 DIAGNOSIS — R5383 Other fatigue: Principal | ICD-10-CM

## 2013-10-21 DIAGNOSIS — L989 Disorder of the skin and subcutaneous tissue, unspecified: Secondary | ICD-10-CM | POA: Insufficient documentation

## 2013-10-21 NOTE — Assessment & Plan Note (Signed)
Has been controlled now on clonazepam and remeron.  Unclear if this is contributing to the fatigue.  She denies.  Follow.  Hold on changing medication.

## 2013-10-21 NOTE — Assessment & Plan Note (Signed)
Blood pressure as outlined.  Given increase in creatinine,  lisinoptil/hctz was changed to 20/12.5.   Blood pressure has been doing well.  Continue to follow metabolic panel - cr.  Follow pressures.  Continue current medication regimen.

## 2013-10-21 NOTE — Assessment & Plan Note (Signed)
Planning to see Dr Phillip Heal.

## 2013-10-21 NOTE — Assessment & Plan Note (Signed)
S/p lumpectomy and XRT.  Has been released.  Last mammogram 10/04/12 - ok.  Schedule f/u mammogram.

## 2013-10-21 NOTE — Progress Notes (Signed)
Subjective:    [name], female    DOB: 04-Dec-1930, 80 y.o.   MRN: [id number]  HPI 80 year old female with past history of hypertension, hypercholesterolemia, breast cancer, depression and lumbar spinal stenosis.  She comes in today for a scheduled follow up.   She was diagnosed with breast cancer 06/19/93.  S/p lumpectomy and XRT.  She also suffered from depression after her husband passed away in 06/19/05.  Previously saw Dr Marjory Lies and Dr Bridgett Larsson.  Was admitted to [facility] for four days.  Takes clonazepam and remeron and this controls things well.  Has been on this regimen for years with no change.  She also has a pinched nerve and has been diagnosed with lumbar spinal stenosis and a bulging disc.  Has had 6 epidurals with no significant relief.  Sees Dr Sharlet Salina as needed.  Takes Lyrica and has tramadol if needed.  Does not take the tramadol often. She has some back stiffness.   She is able to do the things she needs to do at home.  Overall she feels things are stable with her back.  Cr has been increased.  Previous visit, I adjusted her blood pressure medication.  Decreased the HCTZ portion.  She comes in today stating that she just has no energy.  Some decreased appetite.  Some increased anxiety.  Loose stool.  Sleeps well.  Taking her medication regularly.  No increased depression reported.  No chest pain or tightness.  No sob.  No nausea or vomiting.      Past Medical History  Diagnosis Date  . Arthritis   . Breast cancer     1995, s/p XRT  . Depression   . Hypercholesterolemia   . Hypertension   . Hx of colonic polyp   . Spinal stenosis     lumbar    Outpatient Encounter Prescriptions as of 10/19/2013  Medication Sig  . aspirin 81 MG tablet Take 81 mg by mouth daily.  . calcium-vitamin D (OSCAL WITH D) 500-200 MG-UNIT per tablet Take 2 tablets by mouth daily.  . clonazePAM (KLONOPIN) 0.5 MG tablet Take one tablet q day to bid prn  . lisinopril-hydrochlorothiazide  (ZESTORETIC) 20-12.5 MG per tablet Take 1 tablet by mouth daily.  Marland Kitchen lovastatin (MEVACOR) 40 MG tablet Take 1-2 tablets (40-80 mg total) by mouth daily.  . mirtazapine (REMERON) 15 MG tablet Take 1 tablet (15 mg total) by mouth at bedtime.  . Multiple Vitamin (MULTIVITAMIN) tablet Take 1 tablet by mouth daily.  . pregabalin (LYRICA) 50 MG capsule Take 50 mg by mouth daily.  . Red Yeast Rice Extract (RED YEAST RICE PO) Take by mouth.  . traMADol (ULTRAM) 50 MG tablet Take 25-50 mg by mouth every 8 (eight) hours as needed for pain.  . [DISCONTINUED] mirtazapine (REMERON) 15 MG tablet Take 1 tablet (15 mg total) by mouth at bedtime.    Review of Systems Patient denies any headache, lightheadedness or dizziness.  No significant sinus or allergy symptoms.  No sore throat.  No chest pain, tightness or palpitations.  No increased shortness of breath, cough or congestion.  No nausea or vomiting.  No acid reflux. Some decreased appetite.  No abdominal pain or cramping.  No  BRBPR or melana.  Some loose stool.  No urine change.   Takes clonazepam and remeron.   Feels she has been doing well on these medications.  Back stable.  Decreased energy.  Some increased anxiety.  Objective:   Physical Exam  Filed Vitals:   10/19/13 1407  BP: 120/70  Pulse: 76  Temp: 98.4 F (36.9 C)   Blood pressure recheck by me:  129/70 lying (not orthostatic on exam).  Pulse 85  80 year old female in no acute distress.   HEENT:  Nares- clear.  Oropharynx - without lesions. NECK:  Supple.  Nontender.  No audible bruit.  HEART:  Appears to be regular.  Question of I/VI systolic murmur.  LUNGS:  No crackles or wheezing audible.  Respirations even and unlabored.  RADIAL PULSE:  Equal bilaterally.  ABDOMEN:  Soft, nontender.  Bowel sounds present and normal.  Audible abdominal bruit and question of femoral bruit.   EXTREMITIES:  No increased edema present.  DP pulses palpable and equal bilaterally.      Assessment  & Plan:  HEALTH MAINTENANCE.  Physical 03/12/13.   Has had Tdap and shingles vaccine.  Colonoscopy 2013 [name]).  Mammogram 10/04/12 - ok.  Schedule f/u mammogram.    I spent 25 minutes with the patient and more than 50% of the time was spent in consultation regarding the above.

## 2013-10-21 NOTE — Assessment & Plan Note (Signed)
On lovastatin.  Low cholesterol diet.  Follow lipid panel and liver function.

## 2013-10-21 NOTE — Assessment & Plan Note (Signed)
Renal ultrasound (03/16/13) as outlined.  Cr Cope reviewed.  Felt to be a probable benign fatty tumor.  Recommended f/u renal ultrasound in 6 months from last.  Nephrology reviewed and favored yearly imaging.

## 2013-10-21 NOTE — Assessment & Plan Note (Signed)
Cr checked -1.3 - 1.4.  In reviewing records - stable.  Urinalysis negative for protein.  Given the renal insufficiency and the bruit, renal ultrasound performed.  Results reviewed.  Dr Jacqlyn Larsen reviewed.  Recommended f/u renal ultrasound in 6 months from last.  We decreased lisinopril/hctz to 20/12.5.  Follow Cr.  Stay hydrated.  Given persistent elevation, was referred to nephrology.  Evaluated by nephrology.  See Dr Caprice Red note for details.

## 2013-10-21 NOTE — Assessment & Plan Note (Signed)
Increased fatigue.  Decreased energy.  No pain.  Sleeping well.  Some increased anxiety.  Denies increased depression.  Discussed at length with her today.  Unclear etiology.  She is taking her remeron and clonazepam.  Will check cbc, met c and tsh.  Have her monitor for triggers regarding her bowel.  Further w/up pending results.

## 2013-10-21 NOTE — Assessment & Plan Note (Signed)
Colonoscopy 2013 - one polyp.  Followed by Dr [name].

## 2013-10-21 NOTE — Assessment & Plan Note (Signed)
Persistent back pain and stiffness.  Has seen Dr Sharlet Salina.  S/p epidural injections.  No relief.  Sees Dr Sharlet Salina prn.  Has tramadol if needed.  On lyrica.

## 2013-10-23 ENCOUNTER — Other Ambulatory Visit (INDEPENDENT_AMBULATORY_CARE_PROVIDER_SITE_OTHER): Payer: Medicare Other

## 2013-10-23 DIAGNOSIS — E7800 Pure hypercholesterolemia, unspecified: Secondary | ICD-10-CM

## 2013-10-23 DIAGNOSIS — R5381 Other malaise: Secondary | ICD-10-CM

## 2013-10-23 DIAGNOSIS — R5383 Other fatigue: Principal | ICD-10-CM

## 2013-10-23 DIAGNOSIS — I10 Essential (primary) hypertension: Secondary | ICD-10-CM

## 2013-10-23 LAB — BASIC METABOLIC PANEL
BUN: 20 mg/dL (ref 6–23)
CALCIUM: 9.4 mg/dL (ref 8.4–10.5)
CO2: 30 mEq/L (ref 19–32)
CREATININE: 1.4 mg/dL — AB (ref 0.4–1.2)
Chloride: 101 mEq/L (ref 96–112)
GFR: 40.03 mL/min — AB (ref >60.00)
Glucose, Bld: 95 mg/dL (ref 70–99)
Potassium: 4.1 mEq/L (ref 3.5–5.1)
SODIUM: 138 meq/L (ref 135–145)

## 2013-10-23 LAB — CBC WITH DIFFERENTIAL/PLATELET
Basophils Absolute: 0 10*3/uL (ref 0.0–0.1)
Basophils Relative: 0.3 % (ref 0.0–3.0)
EOS PCT: 3.6 % (ref 0.0–5.0)
Eosinophils Absolute: 0.2 10*3/uL (ref 0.0–0.7)
HEMATOCRIT: 42.2 % (ref 36.0–46.0)
Hemoglobin: 14.3 g/dL (ref 12.0–15.0)
Lymphocytes Relative: 21.4 % (ref 12.0–46.0)
Lymphs Abs: 1.2 10*3/uL (ref 0.7–4.0)
MCHC: 33.9 g/dL (ref 30.0–36.0)
MCV: 89.5 fl (ref 78.0–100.0)
MONOS PCT: 8.7 % (ref 3.0–12.0)
Monocytes Absolute: 0.5 10*3/uL (ref 0.1–1.0)
NEUTROS ABS: 3.8 10*3/uL (ref 1.4–7.7)
Neutrophils Relative %: 66 % (ref 43.0–77.0)
Platelets: 218 10*3/uL (ref 150.0–400.0)
RBC: 4.71 Mil/uL (ref 3.87–5.11)
RDW: 13.3 % (ref 11.5–15.5)
WBC: 5.8 10*3/uL (ref 4.0–10.5)

## 2013-10-23 LAB — HEPATIC FUNCTION PANEL
ALK PHOS: 53 U/L (ref 39–117)
ALT: 14 U/L (ref 0–35)
AST: 25 U/L (ref 0–37)
Albumin: 3.8 g/dL (ref 3.5–5.2)
Bilirubin, Direct: 0.1 mg/dL (ref 0.0–0.3)
TOTAL PROTEIN: 6.3 g/dL (ref 6.0–8.3)
Total Bilirubin: 0.8 mg/dL (ref 0.2–1.2)

## 2013-10-23 LAB — LIPID PANEL
CHOL/HDL RATIO: 4
Cholesterol: 183 mg/dL (ref 0–200)
HDL: 51.7 mg/dL (ref >39.00)
LDL CALC: 105 mg/dL — AB (ref 0–99)
NONHDL: 131.3
Triglycerides: 130 mg/dL (ref 0.0–149.0)
VLDL: 26 mg/dL (ref 0.0–40.0)

## 2013-10-23 LAB — TSH: TSH: 3.2 u[IU]/mL (ref 0.35–4.50)

## 2013-10-24 ENCOUNTER — Encounter: Payer: Self-pay | Admitting: *Deleted

## 2013-11-20 ENCOUNTER — Ambulatory Visit: Payer: Medicare Other | Admitting: Internal Medicine

## 2013-12-03 ENCOUNTER — Ambulatory Visit: Payer: Medicare Other | Admitting: Internal Medicine

## 2014-01-14 ENCOUNTER — Other Ambulatory Visit: Payer: Self-pay | Admitting: *Deleted

## 2014-01-14 ENCOUNTER — Telehealth: Payer: Self-pay

## 2014-01-14 MED ORDER — PREGABALIN 50 MG PO CAPS: 50.0000 mg | ORAL_CAPSULE | Freq: Every day | ORAL | Status: DC

## 2014-01-14 NOTE — Telephone Encounter (Signed)
Pt states that Dr. Sharlet Salina told her that he was okay with her PCP taking over the refills on this medication. Please advise. Pt wanted to know if you are going to refill this before she would schedule a f/u appt.

## 2014-01-14 NOTE — Telephone Encounter (Signed)
I am not sure that we have filled this previously.  Per my note she sees Dr Sharlet Salina.  Has he been refilling the medication?  She needs a f/u appt with me.  (69min appt).  Let me know if any problems.

## 2014-01-14 NOTE — Telephone Encounter (Signed)
Okay to fill? Pt last seen in August & cancelled last 2 appts in September.

## 2014-01-14 NOTE — Telephone Encounter (Signed)
The patient called and is hoping to get a refill of her lyrica 50mg  medication.  Pharmacy - [facility]  Pt callback - [phone]

## 2014-01-14 NOTE — Telephone Encounter (Signed)
Ok to refill x 1.  Needs f/u appt.

## 2014-01-14 NOTE — Telephone Encounter (Signed)
Rx refilled for #30 & pt notified

## 2014-01-14 NOTE — Telephone Encounter (Signed)
Patient scheduled, thanks!

## 2014-01-21 ENCOUNTER — Other Ambulatory Visit: Payer: Self-pay | Admitting: *Deleted

## 2014-01-21 MED ORDER — LOVASTATIN 40 MG PO TABS: 40.0000 mg | ORAL_TABLET | Freq: Every day | ORAL | Status: DC

## 2014-01-24 ENCOUNTER — Other Ambulatory Visit: Payer: Self-pay | Admitting: *Deleted

## 2014-01-24 MED ORDER — MIRTAZAPINE 15 MG PO TABS: 15.0000 mg | ORAL_TABLET | Freq: Every day | ORAL | Status: DC

## 2014-01-24 MED ORDER — LOVASTATIN 40 MG PO TABS: 40.0000 mg | ORAL_TABLET | Freq: Every day | ORAL | Status: DC

## 2014-01-24 NOTE — Addendum Note (Signed)
Addended by: Wynonia Lawman E on: 01/24/2014 03:18 PM   Modules accepted: Orders

## 2014-01-25 ENCOUNTER — Other Ambulatory Visit: Payer: Self-pay

## 2014-01-25 MED ORDER — CLONAZEPAM 0.5 MG PO TABS: ORAL_TABLET | ORAL | Status: DC

## 2014-01-25 NOTE — Telephone Encounter (Signed)
Paper Rx request for Clonazepam received. Medication last filled 08/06/13 and patient last office visit was 10/19/13. Please advise.

## 2014-01-25 NOTE — Telephone Encounter (Signed)
Rx faxed to patient's preferred pharmacy.

## 2014-01-25 NOTE — Telephone Encounter (Signed)
Refilled clonazepam #135 with no refills.

## 2014-01-29 ENCOUNTER — Ambulatory Visit (INDEPENDENT_AMBULATORY_CARE_PROVIDER_SITE_OTHER): Payer: Medicare Other | Admitting: Internal Medicine

## 2014-01-29 ENCOUNTER — Encounter: Payer: Self-pay | Admitting: *Deleted

## 2014-01-29 ENCOUNTER — Encounter: Payer: Self-pay | Admitting: Internal Medicine

## 2014-01-29 VITALS — BP 130/80 | HR 81 | Temp 97.8°F | Ht 62.25 in | Wt 124.5 lb

## 2014-01-29 DIAGNOSIS — M48061 Spinal stenosis, lumbar region without neurogenic claudication: Secondary | ICD-10-CM

## 2014-01-29 DIAGNOSIS — N183 Chronic kidney disease, stage 3 unspecified: Secondary | ICD-10-CM

## 2014-01-29 DIAGNOSIS — F329 Major depressive disorder, single episode, unspecified: Secondary | ICD-10-CM

## 2014-01-29 DIAGNOSIS — C4490 Unspecified malignant neoplasm of skin, unspecified: Secondary | ICD-10-CM

## 2014-01-29 DIAGNOSIS — C50919 Malignant neoplasm of unspecified site of unspecified female breast: Secondary | ICD-10-CM

## 2014-01-29 DIAGNOSIS — Q6100 Congenital renal cyst, unspecified: Secondary | ICD-10-CM

## 2014-01-29 DIAGNOSIS — F32A Depression, unspecified: Secondary | ICD-10-CM

## 2014-01-29 DIAGNOSIS — E7800 Pure hypercholesterolemia, unspecified: Secondary | ICD-10-CM

## 2014-01-29 DIAGNOSIS — Z86010 Personal history of colonic polyps: Secondary | ICD-10-CM

## 2014-01-29 DIAGNOSIS — N281 Cyst of kidney, acquired: Secondary | ICD-10-CM

## 2014-01-29 DIAGNOSIS — I10 Essential (primary) hypertension: Secondary | ICD-10-CM

## 2014-01-29 DIAGNOSIS — M4806 Spinal stenosis, lumbar region: Secondary | ICD-10-CM

## 2014-01-29 LAB — HEPATIC FUNCTION PANEL
ALBUMIN: 4.1 g/dL (ref 3.5–5.2)
ALK PHOS: 57 U/L (ref 39–117)
ALT: 12 U/L (ref 0–35)
AST: 21 U/L (ref 0–37)
BILIRUBIN TOTAL: 0.4 mg/dL (ref 0.2–1.2)
Bilirubin, Direct: 0.1 mg/dL (ref 0.0–0.3)
Total Protein: 6.5 g/dL (ref 6.0–8.3)

## 2014-01-29 LAB — BASIC METABOLIC PANEL
BUN: 20 mg/dL (ref 6–23)
CALCIUM: 9.4 mg/dL (ref 8.4–10.5)
CO2: 29 meq/L (ref 19–32)
CREATININE: 1.4 mg/dL — AB (ref 0.4–1.2)
Chloride: 102 mEq/L (ref 96–112)
GFR: 39.33 mL/min — ABNORMAL LOW (ref >60.00)
GLUCOSE: 94 mg/dL (ref 70–99)
Potassium: 4.1 mEq/L (ref 3.5–5.1)
Sodium: 140 mEq/L (ref 135–145)

## 2014-01-29 LAB — LIPID PANEL
Cholesterol: 183 mg/dL (ref 0–200)
HDL: 59.7 mg/dL (ref >39.00)
LDL Cholesterol: 92 mg/dL (ref 0–99)
NONHDL: 123.3
Total CHOL/HDL Ratio: 3
Triglycerides: 156 mg/dL — ABNORMAL HIGH (ref 0.0–149.0)
VLDL: 31.2 mg/dL (ref 0.0–40.0)

## 2014-01-29 NOTE — Progress Notes (Signed)
Pre visit review using our clinic review tool, if applicable. No additional management support is needed unless otherwise documented below in the visit note. 

## 2014-01-29 NOTE — Addendum Note (Signed)
Addended by: Leeanne Rio on: 01/29/2014 11:39 AM   Modules accepted: Medications

## 2014-01-29 NOTE — Progress Notes (Signed)
Subjective:    [name], female    DOB: 08/12/1930, 80 y.o.   MRN: [id number]  HPI 80 year old female with past history of hypertension, hypercholesterolemia, breast cancer, depression and lumbar spinal stenosis.  She comes in today for a scheduled follow up.   She was diagnosed with breast cancer 20-May-1993.  S/p lumpectomy and XRT.  She also suffered from depression after her husband passed away in 05-20-2005.  Previously saw Dr Marjory Lies and Dr Bridgett Larsson.  Was admitted to [facility] for four days.  Takes clonazepam and remeron and this controls things well.  Has been on this regimen for years with no change.  She states occasionally will feel stressed, but overall feels things are stable.  Does not feel she needs a change or any further intervention.  She also has a pinched nerve and has been diagnosed with lumbar spinal stenosis and a bulging disc.  Has had 6 epidurals with no significant relief.  Sees Dr Sharlet Salina as needed.  Takes Lyrica and has tramadol if needed.  Does not take the tramadol often.  She has some back stiffness.   She is able to do the things she needs to do at home.  Overall she feels things are stable with her back.  Cr has been increased.  Previous visit, I adjusted her blood pressure medication.  Decreased the HCTZ portion.   Blood pressure has been doing well.  Energy better.   No chest pain or tightness.  No sob.  No nausea or vomiting.  Overall she feels she is doing well.  Seeing Dr Phillip Heal for skin cancers (basal cell and squamous cell).      Past Medical History  Diagnosis Date  . Arthritis   . Breast cancer     1995, s/p XRT  . Depression   . Hypercholesterolemia   . Hypertension   . Hx of colonic polyp   . Spinal stenosis     lumbar    Outpatient Encounter Prescriptions as of 01/29/2014  Medication Sig  . aspirin 81 MG tablet Take 81 mg by mouth daily.  . calcium-vitamin D (OSCAL WITH D) 500-200 MG-UNIT per tablet Take 2 tablets by mouth daily.  .  clonazePAM (KLONOPIN) 0.5 MG tablet Take one tablet q day to bid prn  . lisinopril-hydrochlorothiazide (ZESTORETIC) 20-12.5 MG per tablet Take 1 tablet by mouth daily.  Marland Kitchen lovastatin (MEVACOR) 40 MG tablet Take 1-2 tablets (40-80 mg total) by mouth daily.  . mirtazapine (REMERON) 15 MG tablet Take 1 tablet (15 mg total) by mouth at bedtime.  . Multiple Vitamin (MULTIVITAMIN) tablet Take 1 tablet by mouth daily.  . pregabalin (LYRICA) 50 MG capsule Take 1 capsule (50 mg total) by mouth daily.  . Red Yeast Rice Extract (RED YEAST RICE PO) Take by mouth.  . traMADol (ULTRAM) 50 MG tablet Take 25-50 mg by mouth every 8 (eight) hours as needed for pain.    Review of Systems Patient denies any headache, lightheadedness or dizziness.  No sinus or allergy symptoms.  No sore throat.  No chest pain, tightness or palpitations.  No increased shortness of breath, cough or congestion.  No nausea or vomiting.  No acid reflux. States she is eating regular meals.  No abdominal pain or cramping.  No  BRBPR or melana.  No bowel change reported.  No urine change.   Takes clonazepam and remeron.   Feels she has been doing well on these medications.  Back stable.  Objective:   Physical Exam  Filed Vitals:   01/29/14 0800  BP: 130/80  Pulse: 81  Temp: 97.8 F (36.6 C)   Blood pressure recheck by me: 25/52   80 year old female in no acute distress.   HEENT:  Nares- clear.  Oropharynx - without lesions. NECK:  Supple.  Nontender.  No audible bruit.  HEART:  Appears to be regular.  Question of I/VI systolic murmur.  LUNGS:  No crackles or wheezing audible.  Respirations even and unlabored.  RADIAL PULSE:  Equal bilaterally.  ABDOMEN:  Soft, nontender.  Bowel sounds present and normal.  Audible abdominal bruit and question of femoral bruit.   EXTREMITIES:  No increased edema present.  DP pulses palpable and equal bilaterally.      Assessment & Plan:  1. Essential hypertension, benign Blood pressure  doing well.   - Basic metabolic panel  2. Chronic kidney disease (CKD), stage III (moderate) Cr last check stable 1.4.  Saw nephrology.  Recheck met b today.    3. Spinal stenosis of lumbar region Back pain stable.  Has seen Dr Sharlet Salina.  On lyrica.   4. History of colonic polyps Colonoscopy 2013.    5. Depression Stable.  Same medications.    6. Hypercholesterolemia On lovastatin.  Low cholesterol diet and exercise.   - Lipid panel - Hepatic function panel  7. Breast cancer, unspecified laterality Mammogram 10/08/13 - Birads I.   8. Renal cyst See nephrology note.  Recommended f/u ultrasound.  (possible urology eval).  Follow.  Recheck renal function today.    9. Skin cancer Seeing Dr Phillip Heal.   HEALTH MAINTENANCE.  Physical 03/12/13.   Has had Tdap and shingles vaccine.  Colonoscopy 2013 [name]).  Mammogram 10/08/13 - Birads I.     I spent 25 minutes with the patient and more than 50% of the time was spent in consultation regarding the above.

## 2014-02-07 ENCOUNTER — Other Ambulatory Visit: Payer: Self-pay | Admitting: *Deleted

## 2014-02-07 MED ORDER — CLONAZEPAM 0.5 MG PO TABS: ORAL_TABLET | ORAL | Status: DC

## 2014-02-07 NOTE — Telephone Encounter (Signed)
Rx faxed & pt notified

## 2014-02-14 ENCOUNTER — Other Ambulatory Visit: Payer: Self-pay | Admitting: Internal Medicine

## 2014-05-03 ENCOUNTER — Encounter (INDEPENDENT_AMBULATORY_CARE_PROVIDER_SITE_OTHER): Payer: Self-pay

## 2014-05-03 ENCOUNTER — Encounter: Payer: Self-pay | Admitting: Internal Medicine

## 2014-05-03 ENCOUNTER — Ambulatory Visit (INDEPENDENT_AMBULATORY_CARE_PROVIDER_SITE_OTHER): Payer: PPO | Admitting: Internal Medicine

## 2014-05-03 VITALS — BP 110/70 | HR 84 | Temp 98.2°F | Ht 62.25 in | Wt 126.5 lb

## 2014-05-03 DIAGNOSIS — I10 Essential (primary) hypertension: Secondary | ICD-10-CM

## 2014-05-03 DIAGNOSIS — C50919 Malignant neoplasm of unspecified site of unspecified female breast: Secondary | ICD-10-CM

## 2014-05-03 DIAGNOSIS — N281 Cyst of kidney, acquired: Secondary | ICD-10-CM

## 2014-05-03 DIAGNOSIS — Q6100 Congenital renal cyst, unspecified: Secondary | ICD-10-CM

## 2014-05-03 DIAGNOSIS — F329 Major depressive disorder, single episode, unspecified: Secondary | ICD-10-CM

## 2014-05-03 DIAGNOSIS — E7800 Pure hypercholesterolemia, unspecified: Secondary | ICD-10-CM

## 2014-05-03 DIAGNOSIS — N183 Chronic kidney disease, stage 3 unspecified: Secondary | ICD-10-CM

## 2014-05-03 DIAGNOSIS — Z0000 Encounter for general adult medical examination without abnormal findings: Secondary | ICD-10-CM

## 2014-05-03 DIAGNOSIS — F32A Depression, unspecified: Secondary | ICD-10-CM

## 2014-05-03 DIAGNOSIS — Z86010 Personal history of colonic polyps: Secondary | ICD-10-CM

## 2014-05-03 DIAGNOSIS — C4490 Unspecified malignant neoplasm of skin, unspecified: Secondary | ICD-10-CM

## 2014-05-03 NOTE — Progress Notes (Signed)
Pre visit review using our clinic review tool, if applicable. No additional management support is needed unless otherwise documented below in the visit note. 

## 2014-05-03 NOTE — Progress Notes (Signed)
[name], female   DOB: 03/11/1930, 81 y.o.   MRN: [id number]    Subjective:    [name], female    DOB: 16-Jun-1930, 81 y.o.   MRN: [id number]  HPI  Patient here for a scheduled follow up.  She states she has been under some increased stress recently.  Planning for cataract surgery next week.  Seeing dermatology.  Recommended f/u in one year.  No cardiac symptoms with increased activity or exertion.  Breathing stable.  Eating and drinking well.  Bowels stable.  Tries to stay active.     Past Medical History  Diagnosis Date  . Arthritis   . Breast cancer     1995, s/p XRT  . Depression   . Hypercholesterolemia   . Hypertension   . Hx of colonic polyp   . Spinal stenosis     lumbar    Current Outpatient Prescriptions on File Prior to Visit  Medication Sig Dispense Refill  . aspirin 81 MG tablet Take 81 mg by mouth daily.    . calcium-vitamin D (OSCAL WITH D) 500-200 MG-UNIT per tablet Take 2 tablets by mouth daily.    . clonazePAM (KLONOPIN) 0.5 MG tablet Take one tablet q day to bid prn 135 tablet 0  . lisinopril-hydrochlorothiazide (PRINZIDE,ZESTORETIC) 20-12.5 MG per tablet TAKE 1 BY MOUTH DAILY 90 tablet 0  . lovastatin (MEVACOR) 40 MG tablet Take 1-2 tablets (40-80 mg total) by mouth daily. 180 tablet 1  . mirtazapine (REMERON) 15 MG tablet Take 1 tablet (15 mg total) by mouth at bedtime. 90 tablet 0  . Multiple Vitamin (MULTIVITAMIN) tablet Take 1 tablet by mouth daily.    . pregabalin (LYRICA) 50 MG capsule Take 50 mg by mouth 2 (two) times daily.     . traMADol (ULTRAM) 50 MG tablet Take 25-50 mg by mouth every 8 (eight) hours as needed for pain.     No current facility-administered medications on file prior to visit.    Review of Systems  Constitutional: Negative for activity change and unexpected weight change.  HENT: Negative for congestion and sinus pressure.   Respiratory: Negative for cough, chest tightness and shortness of  breath.   Cardiovascular: Negative for chest pain, palpitations and leg swelling.  Gastrointestinal: Negative for nausea, vomiting, abdominal pain and diarrhea.  Neurological: Negative for dizziness, light-headedness and headaches.  Psychiatric/Behavioral:       Feels she is handling stress relatively well.  Does not feel she needs any further intervention at this time.         Objective:     Blood pressure recheck:  132/72  Physical Exam  Constitutional: She appears well-developed and well-nourished. No distress.  HENT:  Nose: Nose normal.  Mouth/Throat: Oropharynx is clear and moist.  Neck: Neck supple. No thyromegaly present.  Cardiovascular: Normal rate and regular rhythm.   Pulmonary/Chest: Breath sounds normal. No respiratory distress. She has no wheezes.  Abdominal: Soft. Bowel sounds are normal. There is no tenderness.  Musculoskeletal: She exhibits no edema or tenderness.  Lymphadenopathy:    She has no cervical adenopathy.  Skin: No rash noted. No erythema.    BP 110/70 mmHg  Pulse 84  Temp(Src) 98.2 F (36.8 C) (Oral)  Ht 5' 2.25" (1.581 m)  Wt 126 lb 8 oz (57.38 kg)  BMI 22.96 kg/m2  SpO2 99% Wt Readings from Last 3 Encounters:  05/03/14 126 lb 8 oz (57.38 kg)  01/29/14 124 lb 8 oz (56.473 kg)  10/19/13 127 lb 4 oz (57.72 kg)     Lab Results  Component Value Date   WBC 5.8 10/23/2013   HGB 14.3 10/23/2013   HCT 42.2 10/23/2013   PLT 218.0 10/23/2013   GLUCOSE 94 01/29/2014   CHOL 183 01/29/2014   TRIG 156.0* 01/29/2014   HDL 59.70 01/29/2014   LDLDIRECT 126.7 04/17/2013   LDLCALC 92 01/29/2014   ALT 12 01/29/2014   AST 21 01/29/2014   NA 140 01/29/2014   K 4.1 01/29/2014   CL 102 01/29/2014   CREATININE 1.4* 01/29/2014   BUN 20 01/29/2014   CO2 29 01/29/2014   TSH 3.20 10/23/2013       Assessment & Plan:   Problem List Items Addressed This Visit    Breast cancer    S/p lumpectomy and XRT.  Has been released.  Last mammogram 10/08/13 -  Birads I.        Chronic kidney disease (CKD), stage III (moderate)    Evaluated by [facility] nephrology.  Cr last checked 1.4.  Follow.        Depression    Controlled on her current regimen.  Follow.        Essential hypertension, benign - Primary    Blood pressure has been under good control.  Follow pressures.  Same medication regimen.  Check metabolic panel with next labs.        Relevant Orders   Basic metabolic panel   Health care maintenance    Schedule her for a physical.  Mammogram 10/08/13 - Birads I.  Colonoscopy 2013 - Dr [name].        History of colonic polyps    Colonoscopy 2013 - one polyp.  Followed by Dr [name].        Hypercholesterolemia    On lovastatin.  Follow lipid panel and liver function tests.        Relevant Orders   Lipid panel   Hepatic function panel   Renal cyst    Renal ultrasound 03/16/13 revealed a probable benign fatty tumor on her kidney (per Dr Jacqlyn Larsen).  Recommended a 6 month f/u renal ultrasound.  Nephrology reviewed (Dr Jannifer Hick).  Felt to be c/w angiomyolipoma.  Stable since 03/2013.  Favored yearly imaging.  Will follow.  See her note for details.        Skin cancer    Followed by dermatology.  Recommended f/u in on year after last check.  States due 04/2015.          I spent 25 minutes with the patient and more than 50% of the time was spent in consultation regarding the above.     Einar Pheasant, MD

## 2014-05-05 ENCOUNTER — Encounter: Payer: Self-pay | Admitting: Internal Medicine

## 2014-05-05 DIAGNOSIS — Z0000 Encounter for general adult medical examination without abnormal findings: Secondary | ICD-10-CM | POA: Insufficient documentation

## 2014-05-05 NOTE — Assessment & Plan Note (Signed)
Followed by dermatology.  Recommended f/u in on year after last check.  States due 04/2015.

## 2014-05-05 NOTE — Assessment & Plan Note (Signed)
Schedule her for a physical.  Mammogram 10/08/13 - Birads I.  Colonoscopy 2013 - Dr [name].

## 2014-05-05 NOTE — Assessment & Plan Note (Signed)
On lovastatin.  Follow lipid panel and liver function tests.   

## 2014-05-05 NOTE — Assessment & Plan Note (Signed)
Blood pressure has been under good control.  Follow pressures.  Same medication regimen.  Check metabolic panel with next labs.

## 2014-05-05 NOTE — Assessment & Plan Note (Signed)
S/p lumpectomy and XRT.  Has been released.  Last mammogram 10/08/13 - Birads I.

## 2014-05-05 NOTE — Assessment & Plan Note (Signed)
Renal ultrasound 03/16/13 revealed a probable benign fatty tumor on her kidney (per Dr Jacqlyn Larsen).  Recommended a 6 month f/u renal ultrasound.  Nephrology reviewed (Dr Jannifer Hick).  Felt to be c/w angiomyolipoma.  Stable since 03/2013.  Favored yearly imaging.  Will follow.  See her note for details.

## 2014-05-05 NOTE — Assessment & Plan Note (Signed)
Evaluated by [facility] nephrology.  Cr last checked 1.4.  Follow.

## 2014-05-05 NOTE — Assessment & Plan Note (Signed)
Controlled on her current regimen.  Follow.

## 2014-05-05 NOTE — Assessment & Plan Note (Signed)
Colonoscopy 2013 - one polyp.  Followed by Dr [name].

## 2014-05-08 ENCOUNTER — Ambulatory Visit: Payer: Self-pay | Admitting: Ophthalmology

## 2014-08-02 ENCOUNTER — Other Ambulatory Visit (INDEPENDENT_AMBULATORY_CARE_PROVIDER_SITE_OTHER): Payer: PPO

## 2014-08-02 DIAGNOSIS — I10 Essential (primary) hypertension: Secondary | ICD-10-CM | POA: Diagnosis not present

## 2014-08-02 DIAGNOSIS — E7800 Pure hypercholesterolemia, unspecified: Secondary | ICD-10-CM

## 2014-08-02 LAB — HEPATIC FUNCTION PANEL
ALT: 12 U/L (ref 0–35)
AST: 22 U/L (ref 0–37)
Albumin: 4.2 g/dL (ref 3.5–5.2)
Alkaline Phosphatase: 64 U/L (ref 39–117)
Bilirubin, Direct: 0.2 mg/dL (ref 0.0–0.3)
TOTAL PROTEIN: 6.1 g/dL (ref 6.0–8.3)
Total Bilirubin: 0.9 mg/dL (ref 0.2–1.2)

## 2014-08-02 LAB — BASIC METABOLIC PANEL
BUN: 21 mg/dL (ref 6–23)
CALCIUM: 9.3 mg/dL (ref 8.4–10.5)
CO2: 29 meq/L (ref 19–32)
Chloride: 91 mEq/L — ABNORMAL LOW (ref 96–112)
Creatinine, Ser: 1.38 mg/dL — ABNORMAL HIGH (ref 0.40–1.20)
GFR: 38.95 mL/min — ABNORMAL LOW (ref >60.00)
Glucose, Bld: 119 mg/dL — ABNORMAL HIGH (ref 70–99)
Potassium: 4 mEq/L (ref 3.5–5.1)
SODIUM: 127 meq/L — AB (ref 135–145)

## 2014-08-02 LAB — LIPID PANEL
CHOL/HDL RATIO: 3
Cholesterol: 220 mg/dL — ABNORMAL HIGH (ref 0–200)
HDL: 68.5 mg/dL (ref >39.00)
LDL CALC: 129 mg/dL — AB (ref 0–99)
NonHDL: 151.5
TRIGLYCERIDES: 113 mg/dL (ref 0.0–149.0)
VLDL: 22.6 mg/dL (ref 0.0–40.0)

## 2014-08-03 ENCOUNTER — Ambulatory Visit
Admission: EM | Admit: 2014-08-03 | Discharge: 2014-08-03 | Disposition: A | Discharge status: Home / Self Care | Payer: PPO | Attending: Family Medicine | Admitting: Family Medicine

## 2014-08-03 DIAGNOSIS — R350 Frequency of micturition: Secondary | ICD-10-CM | POA: Diagnosis not present

## 2014-08-03 DIAGNOSIS — Z87891 Personal history of nicotine dependence: Secondary | ICD-10-CM | POA: Insufficient documentation

## 2014-08-03 DIAGNOSIS — F329 Major depressive disorder, single episode, unspecified: Secondary | ICD-10-CM | POA: Diagnosis not present

## 2014-08-03 DIAGNOSIS — I10 Essential (primary) hypertension: Secondary | ICD-10-CM | POA: Diagnosis not present

## 2014-08-03 DIAGNOSIS — Z8639 Personal history of other endocrine, nutritional and metabolic disease: Secondary | ICD-10-CM

## 2014-08-03 DIAGNOSIS — Z853 Personal history of malignant neoplasm of breast: Secondary | ICD-10-CM | POA: Diagnosis not present

## 2014-08-03 DIAGNOSIS — E780 Pure hypercholesterolemia: Secondary | ICD-10-CM | POA: Diagnosis not present

## 2014-08-03 DIAGNOSIS — E871 Hypo-osmolality and hyponatremia: Secondary | ICD-10-CM | POA: Diagnosis present

## 2014-08-03 LAB — URINALYSIS COMPLETE WITH MICROSCOPIC (ARMC ONLY)
Bacteria, UA: NONE SEEN — AB
Bilirubin Urine: NEGATIVE
Glucose, UA: NEGATIVE mg/dL
Hgb urine dipstick: NEGATIVE
KETONES UR: NEGATIVE mg/dL
Leukocytes, UA: NEGATIVE
Nitrite: NEGATIVE
Protein, ur: NEGATIVE mg/dL
SPECIFIC GRAVITY, URINE: 1.005 (ref 1.005–1.030)
pH: 5.5 (ref 5.0–8.0)

## 2014-08-03 LAB — BASIC METABOLIC PANEL
ANION GAP: 12 (ref 5–15)
BUN: 17 mg/dL (ref 6–20)
CHLORIDE: 94 mmol/L — AB (ref 101–111)
CO2: 30 mmol/L (ref 22–32)
Calcium: 9.9 mg/dL (ref 8.9–10.3)
Creatinine, Ser: 1.3 mg/dL — ABNORMAL HIGH (ref 0.44–1.00)
GFR calc Af Amer: 43 mL/min — ABNORMAL LOW (ref >60)
GFR, EST NON AFRICAN AMERICAN: 37 mL/min — AB (ref >60)
Glucose, Bld: 108 mg/dL — ABNORMAL HIGH (ref 65–99)
Potassium: 4.2 mmol/L (ref 3.5–5.1)
Sodium: 136 mmol/L (ref 135–145)

## 2014-08-03 NOTE — ED Notes (Signed)
Pt was seen at [facility] (Dr. Nicki Reaper) yesterday, had labs labs checked (in Epic under Chart Review), and labs showed low sodium of 127. Pt is needing to recheck sodium level rechecked today. Pt is C/O polyuria, fatigue, and decreased energy.

## 2014-08-03 NOTE — ED Provider Notes (Signed)
CSN: [id number]     Arrival date & time 08/03/14  0848 History   First MD Initiated Contact with Patient 08/03/14 1024     Chief Complaint  Patient presents with  . Abnormal Lab    Hyponaturemia   (Consider location/radiation/quality/duration/timing/severity/associated sxs/prior Treatment) HPI Comments: 81 yo female here to recheck her sodium; states had blood work done earlier this week and found to have low sodium. Patient complains of decreased energy and frequent urination. Denies any chest pains, shortness of breath, dysuria, fevers, chills, cough.   The history is provided by the patient.    Past Medical History  Diagnosis Date  . Arthritis   . Breast cancer     1995, s/p XRT  . Depression   . Hypercholesterolemia   . Hypertension   . Hx of colonic polyp   . Spinal stenosis     lumbar   Past Surgical History  Procedure Laterality Date  . Breast lumpectomy Left 1995  . Breast biopsy Left 2006, 1995  . Eye surgery Bilateral     cataract excision   Family History  Problem Relation Age of Onset  . Arthritis Mother   . Hyperlipidemia Mother   . Hypertension Mother   . Hyperlipidemia Sister   . Kidney cancer Sister    History  Substance Use Topics  . Smoking status: Former Smoker -- 0.50 packs/day for 4 years    Types: Cigarettes    Quit date: 12/09/1984  . Smokeless tobacco: Never Used     Comment: quit 12/09/84  . Alcohol Use: 0.0 oz/week    0 Standard drinks or equivalent per week     Comment: sparingly   OB History    No data available     Review of Systems  Allergies  Contrast media  Home Medications   Prior to Admission medications   Medication Sig Start Date End Date Taking? Authorizing Provider  aspirin 81 MG tablet Take 81 mg by mouth daily.   Yes Historical Provider, MD  calcium-vitamin D (OSCAL WITH D) 500-200 MG-UNIT per tablet Take 2 tablets by mouth daily.   Yes Historical Provider, MD  clonazePAM (KLONOPIN) 0.5 MG tablet Take one tablet  q day to bid prn 02/07/14  Yes Einar Pheasant, MD  lisinopril-hydrochlorothiazide (PRINZIDE,ZESTORETIC) 20-12.5 MG per tablet TAKE 1 BY MOUTH DAILY 02/14/14  Yes Jackolyn Confer, MD  lovastatin (MEVACOR) 40 MG tablet Take 1-2 tablets (40-80 mg total) by mouth daily. 01/24/14  Yes Einar Pheasant, MD  mirtazapine (REMERON) 15 MG tablet Take 1 tablet (15 mg total) by mouth at bedtime. 01/24/14  Yes Einar Pheasant, MD  Multiple Vitamin (MULTIVITAMIN) tablet Take 1 tablet by mouth daily.   Yes Historical Provider, MD  pregabalin (LYRICA) 50 MG capsule Take 50 mg by mouth 2 (two) times daily.    Yes Historical Provider, MD  traMADol (ULTRAM) 50 MG tablet Take 25-50 mg by mouth every 8 (eight) hours as needed for pain.   Yes Historical Provider, MD  TURMERIC PO Take 1,000 mg by mouth 2 (two) times daily.   Yes Historical Provider, MD   BP 128/67 mmHg  Pulse 79  Temp(Src) 97.9 F (36.6 C) (Oral)  Resp 16  Ht 5\' 2"  (1.575 m)  Wt 125 lb (56.7 kg)  BMI 22.86 kg/m2  SpO2 96% Physical Exam  Constitutional: She appears well-developed and well-nourished. No distress.  Cardiovascular: Normal rate and regular rhythm.   Pulmonary/Chest: Effort normal and breath sounds normal. No respiratory distress. She  has no wheezes. She has no rales.  Abdominal: Soft. Bowel sounds are normal. She exhibits no distension and no mass. There is no tenderness. There is no rebound and no guarding.  Skin: Skin is warm and dry. No rash noted. She is not diaphoretic.  Nursing note and vitals reviewed.   ED Course  Procedures (including critical care time) Labs Review Labs Reviewed  URINALYSIS COMPLETEWITH MICROSCOPIC ([facility] ONLY) - Abnormal; Notable for the following:    Bacteria, UA NONE SEEN (*)    Squamous Epithelial / LPF 0-5 (*)    All other components within normal limits  BASIC METABOLIC PANEL - Abnormal; Notable for the following:    Chloride 94 (*)    Glucose, Bld 108 (*)    Creatinine, Ser 1.30 (*)    GFR  calc non Af Amer 37 (*)    GFR calc Af Amer 43 (*)    All other components within normal limits    Imaging Review No results found.   MDM   1. Urinary frequency   2. H/O electrolyte imbalance    Plan: 1. Test results (sodium normal, glucose normal;  ua negative/normal) and diagnosis reviewed with patient 2. Recommend f/u with PCP to discuss medications and possibility of medication changes/adjustments   Norval Gable, MD 08/03/14 [phone]

## 2014-08-06 ENCOUNTER — Ambulatory Visit (INDEPENDENT_AMBULATORY_CARE_PROVIDER_SITE_OTHER): Payer: PPO | Admitting: Internal Medicine

## 2014-08-06 ENCOUNTER — Encounter: Payer: Self-pay | Admitting: Internal Medicine

## 2014-08-06 VITALS — BP 110/70 | HR 76 | Temp 98.5°F | Ht 62.25 in | Wt 127.2 lb

## 2014-08-06 DIAGNOSIS — N281 Cyst of kidney, acquired: Secondary | ICD-10-CM

## 2014-08-06 DIAGNOSIS — N183 Chronic kidney disease, stage 3 unspecified: Secondary | ICD-10-CM

## 2014-08-06 DIAGNOSIS — E871 Hypo-osmolality and hyponatremia: Secondary | ICD-10-CM | POA: Diagnosis not present

## 2014-08-06 DIAGNOSIS — E7800 Pure hypercholesterolemia, unspecified: Secondary | ICD-10-CM

## 2014-08-06 DIAGNOSIS — Q6100 Congenital renal cyst, unspecified: Secondary | ICD-10-CM | POA: Diagnosis not present

## 2014-08-06 DIAGNOSIS — C50919 Malignant neoplasm of unspecified site of unspecified female breast: Secondary | ICD-10-CM

## 2014-08-06 DIAGNOSIS — I10 Essential (primary) hypertension: Secondary | ICD-10-CM | POA: Diagnosis not present

## 2014-08-06 DIAGNOSIS — F329 Major depressive disorder, single episode, unspecified: Secondary | ICD-10-CM

## 2014-08-06 DIAGNOSIS — Z0000 Encounter for general adult medical examination without abnormal findings: Secondary | ICD-10-CM

## 2014-08-06 DIAGNOSIS — Z86010 Personal history of colonic polyps: Secondary | ICD-10-CM

## 2014-08-06 DIAGNOSIS — F32A Depression, unspecified: Secondary | ICD-10-CM

## 2014-08-06 MED ORDER — MIRTAZAPINE 15 MG PO TABS: 15.0000 mg | ORAL_TABLET | Freq: Every day | ORAL | Status: DC

## 2014-08-06 MED ORDER — LISINOPRIL-HYDROCHLOROTHIAZIDE 20-12.5 MG PO TABS: ORAL_TABLET | ORAL | Status: DC

## 2014-08-06 MED ORDER — LOVASTATIN 40 MG PO TABS: 40.0000 mg | ORAL_TABLET | Freq: Every day | ORAL | Status: DC

## 2014-08-06 MED ORDER — CLONAZEPAM 0.5 MG PO TABS: ORAL_TABLET | ORAL | Status: DC

## 2014-08-06 NOTE — Progress Notes (Signed)
Pre visit review using our clinic review tool, if applicable. No additional management support is needed unless otherwise documented below in the visit note. 

## 2014-08-06 NOTE — Progress Notes (Signed)
[name], female   DOB: 1931-02-02, 81 y.o.   MRN: [id number]   Subjective:    [name], female    DOB: 11-20-1930, 81 y.o.   MRN: [id number]  HPI  Patient here for a scheduled follow up.  Has noticed increased urination recently.  Went to Urgent Care.  States urine ok.  Sodium on our recent lab- 127.  Recheck at Urgent Care - sodium 136.  States has noticed increased urination for approximately two weeks.  Is better now.  No dysuria.  She desires not to have checked today.  Is better.  Feels basically back to normal.  Had diarrhea for one day last week.  Bowels normal now.  Had some increased anxiety last week.  Better now.  Tries to stay active.  No cardiac symptoms with increased activity or exertion.  Feels things currently are stable.     Past Medical History  Diagnosis Date  . Arthritis   . Breast cancer     1995, s/p XRT  . Depression   . Hypercholesterolemia   . Hypertension   . Hx of colonic polyp   . Spinal stenosis     lumbar    Outpatient Encounter Prescriptions as of 08/06/2014  Medication Sig  . aspirin 81 MG tablet Take 81 mg by mouth daily.  . calcium-vitamin D (OSCAL WITH D) 500-200 MG-UNIT per tablet Take 2 tablets by mouth daily.  . clonazePAM (KLONOPIN) 0.5 MG tablet Take one tablet q day to bid prn  . lisinopril-hydrochlorothiazide (PRINZIDE,ZESTORETIC) 20-12.5 MG per tablet TAKE 1 BY MOUTH DAILY  . lovastatin (MEVACOR) 40 MG tablet Take 1-2 tablets (40-80 mg total) by mouth daily.  . mirtazapine (REMERON) 15 MG tablet Take 1 tablet (15 mg total) by mouth at bedtime.  . Multiple Vitamin (MULTIVITAMIN) tablet Take 1 tablet by mouth daily.  . pregabalin (LYRICA) 50 MG capsule Take 50 mg by mouth 2 (two) times daily.   . traMADol (ULTRAM) 50 MG tablet Take 25-50 mg by mouth every 8 (eight) hours as needed for pain.  . [DISCONTINUED] clonazePAM (KLONOPIN) 0.5 MG tablet Take one tablet q day to bid prn  . [DISCONTINUED]  lisinopril-hydrochlorothiazide (PRINZIDE,ZESTORETIC) 20-12.5 MG per tablet TAKE 1 BY MOUTH DAILY  . [DISCONTINUED] lovastatin (MEVACOR) 40 MG tablet Take 1-2 tablets (40-80 mg total) by mouth daily.  . [DISCONTINUED] mirtazapine (REMERON) 15 MG tablet Take 1 tablet (15 mg total) by mouth at bedtime.  . [DISCONTINUED] TURMERIC PO Take 1,000 mg by mouth 2 (two) times daily.   No facility-administered encounter medications on file as of 08/06/2014.    Review of Systems  Constitutional: Negative for appetite change and unexpected weight change.  HENT: Negative for congestion and sinus pressure.   Respiratory: Negative for cough, chest tightness and shortness of breath.   Cardiovascular: Negative for chest pain, palpitations and leg swelling.  Gastrointestinal: Negative for nausea, vomiting, abdominal pain and diarrhea (has resolved.  had for one day last week. ).  Genitourinary: Positive for frequency (better now. ). Negative for dysuria and difficulty urinating.  Musculoskeletal: Negative for back pain and joint swelling.  Skin: Negative for color change and rash.  Neurological: Negative for dizziness, light-headedness and headaches.  Psychiatric/Behavioral:       Some previous increased anxiety.  Feels better now.         Objective:    Physical Exam  Constitutional: She appears well-developed and well-nourished. No distress.  HENT:  Nose: Nose  normal.  Mouth/Throat: Oropharynx is clear and moist.  Neck: Neck supple. No thyromegaly present.  Cardiovascular: Normal rate and regular rhythm.   Pulmonary/Chest: Breath sounds normal. No respiratory distress. She has no wheezes.  Abdominal: Soft. Bowel sounds are normal. There is no tenderness.  Musculoskeletal: She exhibits no edema or tenderness.  Lymphadenopathy:    She has no cervical adenopathy.  Skin: No rash noted. No erythema.    BP 110/70 mmHg  Pulse 76  Temp(Src) 98.5 F (36.9 C) (Oral)  Ht 5' 2.25" (1.581 m)  Wt 127 lb 4  oz (57.72 kg)  BMI 23.09 kg/m2  SpO2 95% Wt Readings from Last 3 Encounters:  08/06/14 127 lb 4 oz (57.72 kg)  08/03/14 125 lb (56.7 kg)  05/03/14 126 lb 8 oz (57.38 kg)     Lab Results  Component Value Date   WBC 5.8 10/23/2013   HGB 14.3 10/23/2013   HCT 42.2 10/23/2013   PLT 218.0 10/23/2013   GLUCOSE 108* 08/03/2014   CHOL 220* 08/02/2014   TRIG 113.0 08/02/2014   HDL 68.50 08/02/2014   LDLDIRECT 126.7 04/17/2013   LDLCALC 129* 08/02/2014   ALT 12 08/02/2014   AST 22 08/02/2014   NA 136 08/03/2014   K 4.2 08/03/2014   CL 94* 08/03/2014   CREATININE 1.30* 08/03/2014   BUN 17 08/03/2014   CO2 30 08/03/2014   TSH 3.20 10/23/2013       Assessment & Plan:   Problem List Items Addressed This Visit    Breast cancer    S/p lumpectomy and XRT.  Has been released.  Last mammogram 10/08/13 - Birads I.       Relevant Medications   clonazePAM (KLONOPIN) 0.5 MG tablet   Chronic kidney disease (CKD), stage III (moderate)    Followed by nephrology.  Cr 1.3-1.4.        Depression    Continue current regimen.  Does not feel she needs any further intervention at this time.  Follow.       Relevant Medications   mirtazapine (REMERON) 15 MG tablet   Essential hypertension, benign    Blood pressure doing well.  Same medication regimen.  Follow pressures.  Follow metabolic panel.       Relevant Medications   lisinopril-hydrochlorothiazide (PRINZIDE,ZESTORETIC) 20-12.5 MG per tablet   lovastatin (MEVACOR) 40 MG tablet   Health care maintenance    Schedule her for a physical.  Mammogram 10/08/13 - Birads I.  Colonoscopy 2013 - Dr [name].       History of colonic polyps    Colonoscopy 2013 - one polyp.  Followed by Dr [name].       Hypercholesterolemia    Low cholesterol diet and exercise.  Follow lipid panel and liver function tests.  On mevacor.   Lab Results  Component Value Date   CHOL 220* 08/02/2014   HDL 68.50 08/02/2014   LDLCALC 129* 08/02/2014   LDLDIRECT  126.7 04/17/2013   TRIG 113.0 08/02/2014   CHOLHDL 3 08/02/2014        Relevant Medications   lisinopril-hydrochlorothiazide (PRINZIDE,ZESTORETIC) 20-12.5 MG per tablet   lovastatin (MEVACOR) 40 MG tablet   Hyponatremia - Primary    Sodium 127 on check here last week.  Recheck at urgent care - 136. Recheck met b in the next several weeks.  Hold on changing medication at this time.  Has been stable.        Relevant Orders   Basic metabolic panel   Renal  cyst    Renal ultrasound 03/16/13 as outlined.  Saw nephrology.  Felt to be c/w angiomyolipoma.  Stable.  Recommend following yearly.  States has f/u scheduled with nephrology.         I spent 25 minutes with the patient and more than 50% of the time was spent in consultation regarding the above.     Einar Pheasant, MD

## 2014-08-07 ENCOUNTER — Encounter: Payer: Self-pay | Admitting: Internal Medicine

## 2014-08-07 DIAGNOSIS — E871 Hypo-osmolality and hyponatremia: Secondary | ICD-10-CM | POA: Insufficient documentation

## 2014-08-07 NOTE — Assessment & Plan Note (Signed)
Low cholesterol diet and exercise.  Follow lipid panel and liver function tests.  On mevacor.   Lab Results  Component Value Date   CHOL 220* 08/02/2014   HDL 68.50 08/02/2014   LDLCALC 129* 08/02/2014   LDLDIRECT 126.7 04/17/2013   TRIG 113.0 08/02/2014   CHOLHDL 3 08/02/2014

## 2014-08-07 NOTE — Assessment & Plan Note (Signed)
Schedule her for a physical.  Mammogram 10/08/13 - Birads I.  Colonoscopy 2013 - Dr [name].

## 2014-08-07 NOTE — Assessment & Plan Note (Signed)
S/p lumpectomy and XRT.  Has been released.  Last mammogram 10/08/13 - Birads I.

## 2014-08-07 NOTE — Assessment & Plan Note (Signed)
Continue current regimen.  Does not feel she needs any further intervention at this time.  Follow.

## 2014-08-07 NOTE — Assessment & Plan Note (Signed)
Renal ultrasound 03/16/13 as outlined.  Saw nephrology.  Felt to be c/w angiomyolipoma.  Stable.  Recommend following yearly.  States has f/u scheduled with nephrology.

## 2014-08-07 NOTE — Assessment & Plan Note (Signed)
Sodium 127 on check here last week.  Recheck at urgent care - 136. Recheck met b in the next several weeks.  Hold on changing medication at this time.  Has been stable.   

## 2014-08-07 NOTE — Assessment & Plan Note (Signed)
Colonoscopy 2013 - one polyp.  Followed by Dr [name].

## 2014-08-07 NOTE — Assessment & Plan Note (Signed)
Blood pressure doing well.  Same medication regimen.  Follow pressures.  Follow metabolic panel.   

## 2014-08-07 NOTE — Assessment & Plan Note (Signed)
Followed by nephrology.  Cr 1.3-1.4.

## 2014-09-17 ENCOUNTER — Other Ambulatory Visit (INDEPENDENT_AMBULATORY_CARE_PROVIDER_SITE_OTHER): Payer: PPO

## 2014-09-17 ENCOUNTER — Telehealth: Payer: Self-pay | Admitting: *Deleted

## 2014-09-17 DIAGNOSIS — E871 Hypo-osmolality and hyponatremia: Secondary | ICD-10-CM

## 2014-09-17 DIAGNOSIS — N289 Disorder of kidney and ureter, unspecified: Secondary | ICD-10-CM

## 2014-09-17 LAB — BASIC METABOLIC PANEL
BUN: 22 mg/dL (ref 6–23)
CALCIUM: 10 mg/dL (ref 8.4–10.5)
CO2: 30 meq/L (ref 19–32)
Chloride: 101 mEq/L (ref 96–112)
Creatinine, Ser: 1.49 mg/dL — ABNORMAL HIGH (ref 0.40–1.20)
GFR: 35.64 mL/min — AB (ref >60.00)
Glucose, Bld: 127 mg/dL — ABNORMAL HIGH (ref 70–99)
Potassium: 4.4 mEq/L (ref 3.5–5.1)
Sodium: 140 mEq/L (ref 135–145)

## 2014-09-17 NOTE — Telephone Encounter (Signed)
i did collect a urine, can you be able to put the order in so i dont order the wrong one

## 2014-09-17 NOTE — Telephone Encounter (Signed)
I think this is a urine protein/cr ratio.  If she did not leave urine will need to have her come in for urine test

## 2014-09-17 NOTE — Telephone Encounter (Signed)
Pt said her [facility] told her to get a bmet and UPC?

## 2014-09-17 NOTE — Telephone Encounter (Signed)
i asked her what did UPC mean and she said she didn't know ? No it wasn't on an lab form, she just had bmet and upc write on a note pad

## 2014-09-17 NOTE — Telephone Encounter (Signed)
Order urine protein/cr ratio

## 2014-09-17 NOTE — Telephone Encounter (Signed)
What is a UPC?  I am not sure what to order?  Did she bring a lab request form?

## 2014-09-17 NOTE — Addendum Note (Signed)
Addended by: Karlene Einstein D on: 09/17/2014 04:04 PM   Modules accepted: Orders

## 2014-09-18 LAB — PROTEIN / CREATININE RATIO, URINE
Creatinine, Urine: 107.7 mg/dL
PROTEIN CREATININE RATIO: 0.05 (ref <0.15)
Total Protein, Urine: 5 mg/dL (ref 5–24)

## 2014-11-06 ENCOUNTER — Encounter: Payer: Self-pay | Admitting: Internal Medicine

## 2014-11-06 ENCOUNTER — Ambulatory Visit (INDEPENDENT_AMBULATORY_CARE_PROVIDER_SITE_OTHER): Payer: PPO | Admitting: Internal Medicine

## 2014-11-06 VITALS — BP 120/80 | HR 81 | Temp 98.3°F | Ht 62.0 in | Wt 124.2 lb

## 2014-11-06 DIAGNOSIS — C50919 Malignant neoplasm of unspecified site of unspecified female breast: Secondary | ICD-10-CM | POA: Diagnosis not present

## 2014-11-06 DIAGNOSIS — Z1239 Encounter for other screening for malignant neoplasm of breast: Secondary | ICD-10-CM

## 2014-11-06 DIAGNOSIS — I10 Essential (primary) hypertension: Secondary | ICD-10-CM

## 2014-11-06 DIAGNOSIS — Z86010 Personal history of colonic polyps: Secondary | ICD-10-CM

## 2014-11-06 DIAGNOSIS — E7800 Pure hypercholesterolemia, unspecified: Secondary | ICD-10-CM

## 2014-11-06 DIAGNOSIS — M48061 Spinal stenosis, lumbar region without neurogenic claudication: Secondary | ICD-10-CM

## 2014-11-06 DIAGNOSIS — Z0000 Encounter for general adult medical examination without abnormal findings: Secondary | ICD-10-CM

## 2014-11-06 DIAGNOSIS — Z23 Encounter for immunization: Secondary | ICD-10-CM | POA: Diagnosis not present

## 2014-11-06 DIAGNOSIS — M4806 Spinal stenosis, lumbar region: Secondary | ICD-10-CM

## 2014-11-06 DIAGNOSIS — F32A Depression, unspecified: Secondary | ICD-10-CM

## 2014-11-06 DIAGNOSIS — F329 Major depressive disorder, single episode, unspecified: Secondary | ICD-10-CM

## 2014-11-06 DIAGNOSIS — N183 Chronic kidney disease, stage 3 unspecified: Secondary | ICD-10-CM

## 2014-11-06 NOTE — Progress Notes (Signed)
Pre-visit discussion using our clinic review tool. No additional management support is needed unless otherwise documented below in the visit note.  

## 2014-11-06 NOTE — Progress Notes (Signed)
[name], female   DOB: [birth year], 81 y.o.   MRN: [id number]   Subjective:    [name], female    DOB: Mar 31, 1930, 81 y.o.   MRN: [id number]  HPI  Patient here to follow up on her current medical issues.  Back is stable.  Dr Sharlet Salina released her.  Stable on her current regimen.  Tries to stay active.  No cardiac symptoms with increased activity or exertion.  No sob.  No acid reflux.  No abdominal pain or cramping.  Bowels stable.  Handling stress relatively well.     Past Medical History  Diagnosis Date  . Arthritis   . Breast cancer     1995, s/p XRT  . Depression   . Hypercholesterolemia   . Hypertension   . Hx of colonic polyp   . Spinal stenosis     lumbar   Past Surgical History  Procedure Laterality Date  . Breast lumpectomy Left 1995  . Breast biopsy Left 2006, 1995  . Eye surgery Bilateral     cataract excision   Family History  Problem Relation Age of Onset  . Arthritis Mother   . Hyperlipidemia Mother   . Hypertension Mother   . Hyperlipidemia Sister   . Kidney cancer Sister    Social History   Social History  . Marital Status: Widowed    Spouse Name: N/A  . Number of Children: 2  . Years of Education: N/A   Social History Main Topics  . Smoking status: Former Smoker -- 0.50 packs/day for 4 years    Types: Cigarettes    Quit date: 12/09/1984  . Smokeless tobacco: Never Used     Comment: quit 12/09/84  . Alcohol Use: 0.0 oz/week    0 Standard drinks or equivalent per week     Comment: sparingly  . Drug Use: No  . Sexual Activity: Not Asked   Other Topics Concern  . None   Social History Narrative    Outpatient Encounter Prescriptions as of 11/06/2014  Medication Sig  . aspirin 81 MG tablet Take 81 mg by mouth daily.  . calcium-vitamin D (OSCAL WITH D) 500-200 MG-UNIT per tablet Take 2 tablets by mouth daily.  . clonazePAM (KLONOPIN) 0.5 MG tablet Take one tablet q day to bid prn  .  lisinopril-hydrochlorothiazide (PRINZIDE,ZESTORETIC) 20-12.5 MG per tablet TAKE 1 BY MOUTH DAILY  . lovastatin (MEVACOR) 40 MG tablet Take 1-2 tablets (40-80 mg total) by mouth daily.  . mirtazapine (REMERON) 15 MG tablet Take 1 tablet (15 mg total) by mouth at bedtime.  . Multiple Vitamin (MULTIVITAMIN) tablet Take 1 tablet by mouth daily.  . pregabalin (LYRICA) 50 MG capsule Take 50 mg by mouth 2 (two) times daily.   . traMADol (ULTRAM) 50 MG tablet Take 25-50 mg by mouth every 8 (eight) hours as needed for pain.   No facility-administered encounter medications on file as of 11/06/2014.    Review of Systems  Constitutional: Negative for appetite change and unexpected weight change.  HENT: Negative for congestion and sinus pressure.   Eyes: Negative for pain and visual disturbance.  Respiratory: Negative for cough, chest tightness and shortness of breath.   Cardiovascular: Negative for chest pain, palpitations and leg swelling.  Gastrointestinal: Negative for nausea, vomiting, abdominal pain and diarrhea.  Genitourinary: Negative for dysuria and difficulty urinating.  Musculoskeletal: Positive for back pain (stable.  chronic.  released by Dr Sharlet Salina. ). Negative for joint swelling.  Skin:  Negative for color change and rash.  Neurological: Negative for dizziness, light-headedness and headaches.  Hematological: Negative for adenopathy. Does not bruise/bleed easily.  Psychiatric/Behavioral: Negative for dysphoric mood and agitation.       Objective:     Blood pressure rechecked by me:  130/78  Physical Exam  Constitutional: She is oriented to person, place, and time. She appears well-developed and well-nourished. No distress.  HENT:  Nose: Nose normal.  Mouth/Throat: Oropharynx is clear and moist.  Eyes: Right eye exhibits no discharge. Left eye exhibits no discharge. No scleral icterus.  Neck: Neck supple. No thyromegaly present.  Cardiovascular: Normal rate and regular rhythm.     Pulmonary/Chest: Breath sounds normal. No accessory muscle usage. No tachypnea. No respiratory distress. She has no decreased breath sounds. She has no wheezes. She has no rhonchi. Right breast exhibits no inverted nipple, no mass, no nipple discharge and no tenderness (no axillary adenopathy). Left breast exhibits no inverted nipple, no mass, no nipple discharge and no tenderness (no axilarry adenopathy).  Abdominal: Soft. Bowel sounds are normal. There is no tenderness.  Musculoskeletal: She exhibits no edema or tenderness.  Lymphadenopathy:    She has no cervical adenopathy.  Neurological: She is alert and oriented to person, place, and time.  Skin: Skin is warm. No rash noted. No erythema.  Psychiatric: She has a normal mood and affect. Her behavior is normal.    BP 120/80 mmHg  Pulse 81  Temp(Src) 98.3 F (36.8 C) (Oral)  Ht 5\' 2"  (1.575 m)  Wt 124 lb 4 oz (56.359 kg)  BMI 22.72 kg/m2  SpO2 97% Wt Readings from Last 3 Encounters:  11/06/14 124 lb 4 oz (56.359 kg)  08/06/14 127 lb 4 oz (57.72 kg)  08/03/14 125 lb (56.7 kg)     Lab Results  Component Value Date   WBC 5.8 10/23/2013   HGB 14.3 10/23/2013   HCT 42.2 10/23/2013   PLT 218.0 10/23/2013   GLUCOSE 127* 09/17/2014   CHOL 220* 08/02/2014   TRIG 113.0 08/02/2014   HDL 68.50 08/02/2014   LDLDIRECT 126.7 04/17/2013   LDLCALC 129* 08/02/2014   ALT 12 08/02/2014   AST 22 08/02/2014   NA 140 09/17/2014   K 4.4 09/17/2014   CL 101 09/17/2014   CREATININE 1.49* 09/17/2014   BUN 22 09/17/2014   CO2 30 09/17/2014   TSH 3.20 10/23/2013       Assessment & Plan:   Problem List Items Addressed This Visit    Breast cancer    S/p lumpectomy and XRT.  Has been released.  Schedule mammogram.  Due f/u.       Chronic kidney disease (CKD), stage III (moderate)    Followed by nephrology.  Cr 1.3-1.4.        Depression    Doing well on current regimen.  Follow.        Relevant Orders   TSH   Essential  hypertension, benign    Blood pressure under good control.  Continue same medication regimen.  Follow pressures.  Follow metabolic panel.        Relevant Orders   CBC with Differential/Platelet   Basic metabolic panel   Health care maintenance    Physical today 11/06/14.  Schedule mammogram.  Colonoscopy 2013 - Dr [name].        History of colonic polyps    Colonoscopy 2013 - one polyp.  Followed by Dr [name].       Hypercholesterolemia  On lovastatin.  Low cholesterol diet and exercise.  Follow lipid panel and liver function tests.        Relevant Orders   Lipid panel   Hepatic function panel   Spinal stenosis of lumbar region    Has previously been followed by Dr Sharlet Salina.  Has been released.  Stable on current regimen.  Follow.        Other Visit Diagnoses    Encounter for immunization    -  Primary    Screening breast examination        Relevant Orders    MM DIGITAL SCREENING BILATERAL        Einar Pheasant, MD

## 2014-11-11 ENCOUNTER — Encounter: Payer: Self-pay | Admitting: Internal Medicine

## 2014-11-11 NOTE — Assessment & Plan Note (Signed)
Colonoscopy 2013 - one polyp.  Followed by Dr [name].

## 2014-11-11 NOTE — Assessment & Plan Note (Signed)
Doing well on current regimen.  Follow.   

## 2014-11-11 NOTE — Assessment & Plan Note (Signed)
Followed by nephrology.  Cr 1.3-1.4.

## 2014-11-11 NOTE — Assessment & Plan Note (Signed)
Physical today 11/06/14.  Schedule mammogram.  Colonoscopy 2013 - Dr [name].

## 2014-11-11 NOTE — Assessment & Plan Note (Signed)
S/p lumpectomy and XRT.  Has been released.  Schedule mammogram.  Due f/u.

## 2014-11-11 NOTE — Assessment & Plan Note (Signed)
Blood pressure under good control.  Continue same medication regimen.  Follow pressures.  Follow metabolic panel.   

## 2014-11-11 NOTE — Assessment & Plan Note (Signed)
On lovastatin.  Low cholesterol diet and exercise.  Follow lipid panel and liver function tests.   

## 2014-11-11 NOTE — Assessment & Plan Note (Signed)
Has previously been followed by Dr Sharlet Salina.  Has been released.  Stable on current regimen.  Follow.

## 2014-11-12 ENCOUNTER — Ambulatory Visit
Admission: RE | Admit: 2014-11-12 | Discharge: 2014-11-12 | Disposition: A | Discharge status: Home / Self Care | Payer: PPO | Source: Ambulatory Visit | Attending: Internal Medicine | Admitting: Internal Medicine

## 2014-11-12 DIAGNOSIS — Z1239 Encounter for other screening for malignant neoplasm of breast: Secondary | ICD-10-CM

## 2014-11-12 DIAGNOSIS — Z1231 Encounter for screening mammogram for malignant neoplasm of breast: Secondary | ICD-10-CM | POA: Insufficient documentation

## 2014-11-12 HISTORY — DX: Malignant melanoma of skin, unspecified: C43.9

## 2014-11-21 ENCOUNTER — Other Ambulatory Visit (INDEPENDENT_AMBULATORY_CARE_PROVIDER_SITE_OTHER): Payer: PPO

## 2014-11-21 DIAGNOSIS — F329 Major depressive disorder, single episode, unspecified: Secondary | ICD-10-CM | POA: Diagnosis not present

## 2014-11-21 DIAGNOSIS — F32A Depression, unspecified: Secondary | ICD-10-CM

## 2014-11-21 DIAGNOSIS — I10 Essential (primary) hypertension: Secondary | ICD-10-CM | POA: Diagnosis not present

## 2014-11-21 DIAGNOSIS — E7800 Pure hypercholesterolemia, unspecified: Secondary | ICD-10-CM

## 2014-11-21 LAB — HEPATIC FUNCTION PANEL
ALBUMIN: 4.1 g/dL (ref 3.5–5.2)
ALK PHOS: 67 U/L (ref 39–117)
ALT: 11 U/L (ref 0–35)
AST: 18 U/L (ref 0–37)
Bilirubin, Direct: 0.1 mg/dL (ref 0.0–0.3)
Total Bilirubin: 0.6 mg/dL (ref 0.2–1.2)
Total Protein: 6.3 g/dL (ref 6.0–8.3)

## 2014-11-21 LAB — BASIC METABOLIC PANEL
BUN: 29 mg/dL — ABNORMAL HIGH (ref 6–23)
CHLORIDE: 103 meq/L (ref 96–112)
CO2: 30 meq/L (ref 19–32)
CREATININE: 1.42 mg/dL — AB (ref 0.40–1.20)
Calcium: 9.7 mg/dL (ref 8.4–10.5)
GFR: 37.66 mL/min — ABNORMAL LOW (ref >60.00)
Glucose, Bld: 101 mg/dL — ABNORMAL HIGH (ref 70–99)
Potassium: 4.3 mEq/L (ref 3.5–5.1)
Sodium: 142 mEq/L (ref 135–145)

## 2014-11-21 LAB — CBC WITH DIFFERENTIAL/PLATELET
Basophils Absolute: 0 10*3/uL (ref 0.0–0.1)
Basophils Relative: 0.5 % (ref 0.0–3.0)
EOS PCT: 3.5 % (ref 0.0–5.0)
Eosinophils Absolute: 0.3 10*3/uL (ref 0.0–0.7)
HCT: 45.5 % (ref 36.0–46.0)
Hemoglobin: 15.1 g/dL — ABNORMAL HIGH (ref 12.0–15.0)
LYMPHS ABS: 1.6 10*3/uL (ref 0.7–4.0)
Lymphocytes Relative: 22 % (ref 12.0–46.0)
MCHC: 33.2 g/dL (ref 30.0–36.0)
MCV: 88.8 fl (ref 78.0–100.0)
Monocytes Absolute: 0.6 10*3/uL (ref 0.1–1.0)
Monocytes Relative: 7.8 % (ref 3.0–12.0)
NEUTROS ABS: 4.9 10*3/uL (ref 1.4–7.7)
NEUTROS PCT: 66.2 % (ref 43.0–77.0)
PLATELETS: 229 10*3/uL (ref 150.0–400.0)
RBC: 5.12 Mil/uL — ABNORMAL HIGH (ref 3.87–5.11)
RDW: 13.9 % (ref 11.5–15.5)
WBC: 7.4 10*3/uL (ref 4.0–10.5)

## 2014-11-21 LAB — TSH: TSH: 3.78 u[IU]/mL (ref 0.35–4.50)

## 2014-11-21 LAB — LIPID PANEL
CHOL/HDL RATIO: 3
Cholesterol: 216 mg/dL — ABNORMAL HIGH (ref 0–200)
HDL: 67 mg/dL (ref >39.00)
LDL Cholesterol: 119 mg/dL — ABNORMAL HIGH (ref 0–99)
NonHDL: 148.74
Triglycerides: 150 mg/dL — ABNORMAL HIGH (ref 0.0–149.0)
VLDL: 30 mg/dL (ref 0.0–40.0)

## 2014-11-22 ENCOUNTER — Encounter: Payer: Self-pay | Admitting: *Deleted

## 2014-12-26 ENCOUNTER — Telehealth: Payer: Self-pay | Admitting: Internal Medicine

## 2014-12-26 ENCOUNTER — Encounter: Payer: Self-pay | Admitting: Registered Nurse

## 2014-12-26 ENCOUNTER — Ambulatory Visit
Admission: EM | Admit: 2014-12-26 | Discharge: 2014-12-26 | Disposition: A | Discharge status: Home / Self Care | Payer: PPO | Attending: Family Medicine | Admitting: Family Medicine

## 2014-12-26 DIAGNOSIS — H6593 Unspecified nonsuppurative otitis media, bilateral: Secondary | ICD-10-CM

## 2014-12-26 DIAGNOSIS — H6123 Impacted cerumen, bilateral: Secondary | ICD-10-CM

## 2014-12-26 NOTE — ED Provider Notes (Signed)
CSN: [id number]     Arrival date & time 12/26/14  1103 History   First MD Initiated Contact with Patient 12/26/14 1229     Chief Complaint  Patient presents with  . Cerumen Impaction   (Consider location/radiation/quality/duration/timing/severity/associated sxs/prior Treatment) HPI Comments: Retired caucasian female with history of cerumen impaction unable to get in with [facility] or Dr Kathyrn Sheriff ENT x 3 weeks.  Hard to hear wants wax cleaned out today.  Typically once a year required removal.  Doesn't use qtips in ears.  The history is provided by the patient.    Past Medical History  Diagnosis Date  . Arthritis   . Depression   . Hypercholesterolemia   . Hypertension   . Hx of colonic polyp   . Spinal stenosis     lumbar  . Breast cancer ([place]) 1995    LT LUMPECTOMY & s/p XRT  . Melanoma [facility])    Past Surgical History  Procedure Laterality Date  . Breast lumpectomy Left 1995  . Eye surgery Bilateral     cataract excision  . Breast biopsy Left 2006    EXCISIONAL - NEG  . Breast biopsy Left 1995    LT LUMPECTOMY FOR CA   Family History  Problem Relation Age of Onset  . Arthritis Mother   . Hyperlipidemia Mother   . Hypertension Mother   . Hyperlipidemia Sister   . Kidney cancer Sister   . Breast cancer Neg Hx    Social History  Substance Use Topics  . Smoking status: Former Smoker -- 0.50 packs/day for 4 years    Types: Cigarettes    Quit date: 12/09/1984  . Smokeless tobacco: Never Used     Comment: quit 12/09/84  . Alcohol Use: 0.0 oz/week    0 Standard drinks or equivalent per week     Comment: sparingly   OB History    No data available     Review of Systems  Constitutional: Negative for fever, chills, diaphoresis, activity change, appetite change, fatigue and unexpected weight change.  HENT: Positive for ear pain and hearing loss. Negative for ear discharge, facial swelling, postnasal drip, rhinorrhea, sinus pressure, sneezing, sore throat, tinnitus, trouble  swallowing and voice change.   Eyes: Negative for photophobia, pain, discharge, redness, itching and visual disturbance.  Respiratory: Negative for cough, shortness of breath, wheezing and stridor.   Cardiovascular: Negative for palpitations and leg swelling.  Gastrointestinal: Negative for nausea and vomiting.  Endocrine: Negative for cold intolerance and heat intolerance.  Genitourinary: Negative for dysuria.  Musculoskeletal: Negative for neck pain and neck stiffness.  Skin: Negative for color change, pallor, rash and wound.  Allergic/Immunologic: Negative for food allergies.  Neurological: Negative for dizziness, facial asymmetry and headaches.  Hematological: Negative for adenopathy. Does not bruise/bleed easily.  Psychiatric/Behavioral: Negative for behavioral problems, confusion, sleep disturbance and agitation.    Allergies  Contrast media  Home Medications   Prior to Admission medications   Medication Sig Start Date End Date Taking? Authorizing Provider  aspirin 81 MG tablet Take 81 mg by mouth daily.   Yes Historical Provider, MD  calcium-vitamin D (OSCAL WITH D) 500-200 MG-UNIT per tablet Take 2 tablets by mouth daily.   Yes Historical Provider, MD  clonazePAM (KLONOPIN) 0.5 MG tablet Take one tablet q day to bid prn 08/06/14  Yes Einar Pheasant, MD  lovastatin (MEVACOR) 40 MG tablet Take 1-2 tablets (40-80 mg total) by mouth daily. 08/06/14  Yes Einar Pheasant, MD  Multiple Vitamin (MULTIVITAMIN) tablet  Take 1 tablet by mouth daily.   Yes Historical Provider, MD  pregabalin (LYRICA) 50 MG capsule Take 50 mg by mouth 2 (two) times daily.    Yes Historical Provider, MD  traMADol (ULTRAM) 50 MG tablet Take 25-50 mg by mouth every 8 (eight) hours as needed for pain.   Yes Historical Provider, MD  lisinopril-hydrochlorothiazide (PRINZIDE,ZESTORETIC) 20-12.5 MG per tablet TAKE 1 BY MOUTH DAILY 08/06/14   Einar Pheasant, MD  mirtazapine (REMERON) 15 MG tablet Take 1 tablet (15 mg  total) by mouth at bedtime. 08/06/14   Einar Pheasant, MD   Meds Ordered and Administered this Visit  Medications - No data to display  BP 126/52 mmHg  Pulse 72  Temp(Src) 96.4 F (35.8 C) (Tympanic)  Resp 16  Ht 5\' 3"  (1.6 m)  Wt 134 lb (60.782 kg)  BMI 23.74 kg/m2  SpO2 98% No data found.   Physical Exam  Constitutional: She is oriented to person, place, and time. Vital signs are normal. She appears well-developed and well-nourished. No distress.  HENT:  Head: Normocephalic and atraumatic.  Right Ear: External ear normal.  Left Ear: External ear and ear canal normal. A middle ear effusion is present.  Nose: Nose normal.  Mouth/Throat: Oropharynx is clear and moist. No oropharyngeal exudate.  External auditory canal right 100% occluded with cerumen brittle and yellow soft; left 75% occlusion soft yellow wax; currettage left external  Canal completed cleared effusion noted clear left TM no erythema patient denied pain; attempted right curettage without much success wax hard brittle obtained minimal patient felt a bit better after attempt "felt like wax gave way" ordered ear irrigation.  Eyes: Conjunctivae, EOM and lids are normal. Pupils are equal, round, and reactive to light. Right eye exhibits no discharge. Left eye exhibits no discharge. No scleral icterus.  Neck: Trachea normal and normal range of motion. Neck supple. No tracheal deviation present. No thyromegaly present.  Cardiovascular: Normal rate, regular rhythm, normal heart sounds and intact distal pulses.  Exam reveals no gallop and no friction rub.   No murmur heard. Pulmonary/Chest: Effort normal and breath sounds normal. No stridor. No respiratory distress. She has no wheezes. She has no rales.  Abdominal: Soft. She exhibits no distension.  Musculoskeletal: Normal range of motion. She exhibits no edema or tenderness.  Lymphadenopathy:    She has no cervical adenopathy.  Neurological: She is alert and oriented to  person, place, and time. She exhibits normal muscle tone. Coordination normal.  Skin: Skin is warm, dry and intact. No rash noted. She is not diaphoretic. No erythema. No pallor.  Psychiatric: She has a normal mood and affect. Her speech is normal and behavior is normal. Judgment and thought content normal. Cognition and memory are normal.  Nursing note and vitals reviewed.   ED Course  Procedures (including critical care time)  Labs Review Labs Reviewed - No data to display  Imaging Review No results found.   1300 repeat evaluation right external canal showed 100% removal cerumen flaking of skin, slight spotting 6 oclock position and effusion clear TM after nurse water irrigation.  Patient denied pain.  Discussed with patient avoid placing anything in ear canals may notice spot of blood on pillowcase.  Patient verbalized understanding of information/instructions and had no further questions at this time. MDM   1. Cerumen impaction, bilateral   2. Otitis media with effusion, bilateral    Cerumen irrigation performed and complete clearing of earwax obtained by nurse utilizing syringe method right  ear.  Curetage by provider left ear completing clearing of impacted cerumen.  Patient reported slight discomfort external ear canal after procedure, minor redness noted bilaterally and TMs intact without erythema.  Patient reported sounds are louder now.  Try mineral oil on cotton ball (soaked) weekly in ear for 20 minutes and when in clinic have provider (PCM/ENT) check ears to see if buildup with this method again Discussed purpose of earwax with patient.  Avoid cotton applicator (Q-tip) use in ears.  Patient verbalized understanding, agreed with plan of care and had no further questions at this time.    Supportive treatment.   No evidence of invasive bacterial infection, non toxic and well hydrated.  This is most likely self limiting viral infection.  I do not see where any further testing or  imaging is necessary at this time.   I will suggest supportive care, rest, good hygiene and encourage the patient to take adequate fluids.  The patient is to return to clinic or EMERGENCY ROOM if symptoms worsen or change significantly e.g. ear pain, fever, purulent discharge from ears or bleeding.  Exitcare handout on otitis media with effusion given to patient.  Patient verbalized agreement and understanding of treatment plan.     Olen Cordial, NP 12/26/14 1315

## 2014-12-26 NOTE — Discharge Instructions (Signed)
Cerumen Impaction The structures of the external ear canal secrete a waxy substance known as cerumen. Excess cerumen can build up in the ear canal, causing a condition known as cerumen impaction. Cerumen impaction can cause ear pain and disrupt the function of the ear. The rate of cerumen production differs for each individual. In certain individuals, the configuration of the ear canal may decrease his or her ability to naturally remove cerumen. CAUSES Cerumen impaction is caused by excessive cerumen production or buildup. RISK FACTORS  Frequent use of swabs to clean ears.  Having narrow ear canals.  Having eczema.  Being dehydrated. SIGNS AND SYMPTOMS  Diminished hearing.  Ear drainage.  Ear pain.  Ear itch. TREATMENT Treatment may involve:  Over-the-counter or prescription ear drops to soften the cerumen.  Removal of cerumen by a health care provider. This may be done with:  Irrigation with warm water. This is the most common method of removal.  Ear curettes and other instruments.  Surgery. This may be done in severe cases. HOME CARE INSTRUCTIONS  Take medicines only as directed by your health care provider.  Do not insert objects into the ear with the intent of cleaning the ear. PREVENTION  Do not insert objects into the ear, even with the intent of cleaning the ear. Removing cerumen as a part of normal hygiene is not necessary, and the use of swabs in the ear canal is not recommended.  Drink enough water to keep your urine clear or pale yellow.  Control your eczema if you have it. SEEK MEDICAL CARE IF:  You develop ear pain.  You develop bleeding from the ear.  The cerumen does not clear after you use ear drops as directed.   This information is not intended to replace advice given to you by your health care provider. Make sure you discuss any questions you have with your health care provider.   Document Released: 04/01/2004 Document Revised: 03/15/2014  Document Reviewed: 10/09/2014 Elsevier Interactive Patient Education 2016 [employer]. Otitis Media With Effusion Otitis media with effusion is the presence of fluid in the middle ear. This is a common problem in children, which often follows ear infections. It may be present for weeks or longer after the infection. Unlike an acute ear infection, otitis media with effusion refers only to fluid behind the ear drum and not infection. Children with repeated ear and sinus infections and allergy problems are the most likely to get otitis media with effusion. CAUSES  The most frequent cause of the fluid buildup is dysfunction of the eustachian tubes. These are the tubes that drain fluid in the ears to the back of the nose (nasopharynx). SYMPTOMS   The main symptom of this condition is hearing loss. As a result, you or your child may:  Listen to the TV at a loud volume.  Not respond to questions.  Ask "what" often when spoken to.  Mistake or confuse one sound or word for another.  There may be a sensation of fullness or pressure but usually not pain. DIAGNOSIS   Your health care provider will diagnose this condition by examining you or your child's ears.  Your health care provider may test the pressure in you or your child's ear with a tympanometer.  A hearing test may be conducted if the problem persists. TREATMENT   Treatment depends on the duration and the effects of the effusion.  Antibiotics, decongestants, nose drops, and cortisone-type drugs (tablets or nasal spray) may not be helpful.  Children with persistent ear effusions may have delayed language or behavioral problems. Children at risk for developmental delays in hearing, learning, and speech may require referral to a specialist earlier than children not at risk.  You or your child's health care provider may suggest a referral to an ear, nose, and throat surgeon for treatment. The following may help restore normal  hearing:  Drainage of fluid.  Placement of ear tubes (tympanostomy tubes).  Removal of adenoids (adenoidectomy). HOME CARE INSTRUCTIONS   Avoid secondhand smoke.  Infants who are breastfed are less likely to have this condition.  Avoid feeding infants while they are lying flat.  Avoid known environmental allergens.  Avoid people who are sick. SEEK MEDICAL CARE IF:   Hearing is not better in 3 months.  Hearing is worse.  Ear pain.  Drainage from the ear.  Dizziness. MAKE SURE YOU:   Understand these instructions.  Will watch your condition.  Will get help right away if you are not doing well or get worse.   This information is not intended to replace advice given to you by your health care provider. Make sure you discuss any questions you have with your health care provider.   Document Released: 04/01/2004 Document Revised: 03/15/2014 Document Reviewed: 09/19/2012 Elsevier Interactive Patient Education [employer].

## 2014-12-26 NOTE — Telephone Encounter (Signed)
Yes, I would need to see her if I have not seen her for this.  Thanks

## 2014-12-26 NOTE — Telephone Encounter (Signed)
Attempted to call patient back, no answer, busy signal

## 2014-12-26 NOTE — Telephone Encounter (Signed)
Please advise?  I can put on our nurse schedule, but do you want to see first?

## 2014-12-26 NOTE — Telephone Encounter (Signed)
Pt called about wax impacted in her right ear. Pt has tried to see her ENT but cannot get in. Pt is not sure if DR Nicki Reaper can get wax out of ears,but she wanted to ask or what to do? Thank You!

## 2014-12-26 NOTE — ED Notes (Signed)
Hx of cerumen impaction and started started "bothering me"

## 2014-12-30 ENCOUNTER — Other Ambulatory Visit: Payer: Self-pay | Admitting: Internal Medicine

## 2014-12-30 NOTE — Telephone Encounter (Signed)
ok'd refill clonazepam #135 with no refills.

## 2014-12-30 NOTE — Telephone Encounter (Signed)
Please advise 

## 2014-12-30 NOTE — Telephone Encounter (Signed)
Patient went to walk in clinic to get this taken care of.

## 2014-12-31 NOTE — Telephone Encounter (Signed)
Rx faxed to [facility]

## 2015-02-10 ENCOUNTER — Other Ambulatory Visit: Payer: Self-pay | Admitting: Internal Medicine

## 2015-03-11 ENCOUNTER — Encounter: Payer: Self-pay | Admitting: Internal Medicine

## 2015-03-11 ENCOUNTER — Ambulatory Visit (INDEPENDENT_AMBULATORY_CARE_PROVIDER_SITE_OTHER): Payer: PPO | Admitting: Internal Medicine

## 2015-03-11 ENCOUNTER — Ambulatory Visit: Payer: PPO

## 2015-03-11 VITALS — BP 130/80 | HR 88 | Temp 98.2°F | Resp 18 | Ht 62.0 in | Wt 127.5 lb

## 2015-03-11 DIAGNOSIS — M48061 Spinal stenosis, lumbar region without neurogenic claudication: Secondary | ICD-10-CM

## 2015-03-11 DIAGNOSIS — I10 Essential (primary) hypertension: Secondary | ICD-10-CM

## 2015-03-11 DIAGNOSIS — M4806 Spinal stenosis, lumbar region: Secondary | ICD-10-CM

## 2015-03-11 DIAGNOSIS — Z0000 Encounter for general adult medical examination without abnormal findings: Secondary | ICD-10-CM | POA: Diagnosis not present

## 2015-03-11 DIAGNOSIS — Z86010 Personal history of colonic polyps: Secondary | ICD-10-CM

## 2015-03-11 DIAGNOSIS — N183 Chronic kidney disease, stage 3 unspecified: Secondary | ICD-10-CM

## 2015-03-11 DIAGNOSIS — E7800 Pure hypercholesterolemia, unspecified: Secondary | ICD-10-CM | POA: Diagnosis not present

## 2015-03-11 DIAGNOSIS — F32A Depression, unspecified: Secondary | ICD-10-CM

## 2015-03-11 DIAGNOSIS — C50919 Malignant neoplasm of unspecified site of unspecified female breast: Secondary | ICD-10-CM

## 2015-03-11 DIAGNOSIS — F329 Major depressive disorder, single episode, unspecified: Secondary | ICD-10-CM

## 2015-03-11 NOTE — Assessment & Plan Note (Signed)
Followed by nephrology.  Cr 1.3-1.4.  Follow.  Stay hydrated.

## 2015-03-11 NOTE — Assessment & Plan Note (Signed)
With increased anxiety.  Depression stable.  On clonazepam and remeron.  Discussed with her today.  No suicidal ideations.  Discussed counseling.  She does not feel she needs this at this point.  She reports she usually feels better if she goes home and lies down.  This relaxes her.  Comfortable to go home.  Will call with update this pm.

## 2015-03-11 NOTE — Assessment & Plan Note (Signed)
Was followed by Dr Sharlet Salina.  Has been released.  Stable.  Does not feel she needs any further intervention at this time.  Follow.

## 2015-03-11 NOTE — Progress Notes (Addendum)
Subjective:   Diana Lester is a 82 y.o. female who presents for an Initial Medicare Annual Wellness Visit.  Review of Systems    No ROS.  Medicare Wellness Visit.  Cardiac Risk Factors include: hypertension     Objective:    Today's Vitals   03/11/15 0957 03/11/15 1052  BP: 130/80   Pulse: 88   Temp: 98.2 F (36.8 C)   TempSrc: Oral   Resp: 18   Height: 5\' 2"  (1.575 m)   Weight: 127 lb 8 oz (57.834 kg)   SpO2: 95%   PainSc:  2     Current Medications (verified) Outpatient Encounter Prescriptions as of 03/11/2015  Medication Sig  . aspirin 81 MG tablet Take 81 mg by mouth daily.  . calcium-vitamin D (OSCAL WITH D) 500-200 MG-UNIT per tablet Take 2 tablets by mouth daily.  . clonazePAM (KLONOPIN) 0.5 MG tablet TAKE 1 TABLET BY MOUTH ONE TO TWO TIMES DAILY AS NEEDED  . lisinopril-hydrochlorothiazide (PRINZIDE,ZESTORETIC) 20-12.5 MG tablet TAKE 1 TABLET BY MOUTH EVERY DAY  . lovastatin (MEVACOR) 40 MG tablet Take 1-2 tablets (40-80 mg total) by mouth daily.  . mirtazapine (REMERON) 15 MG tablet TAKE 1 TABLET BY MOUTH EVERY NIGHT AT BEDTIME  . Multiple Vitamin (MULTIVITAMIN) tablet Take 1 tablet by mouth daily.  . pregabalin (LYRICA) 50 MG capsule Take 50 mg by mouth 2 (two) times daily.   . traMADol (ULTRAM) 50 MG tablet Take 25-50 mg by mouth every 8 (eight) hours as needed for pain.   No facility-administered encounter medications on file as of 03/11/2015.    Allergies (verified) Contrast media   History: Past Medical History  Diagnosis Date  . Arthritis   . Depression   . Hypercholesterolemia   . Hypertension   . Hx of colonic polyp   . Spinal stenosis     lumbar  . Breast cancer ([place]) 1995    LT LUMPECTOMY & s/p XRT  . Melanoma [facility])    Past Surgical History  Procedure Laterality Date  . Breast lumpectomy Left 1995  . Eye surgery Bilateral     cataract excision  . Breast biopsy Left 2006    EXCISIONAL - NEG  . Breast biopsy Left 1995    LT  LUMPECTOMY FOR CA   Family History  Problem Relation Age of Onset  . Arthritis Mother   . Hyperlipidemia Mother   . Hypertension Mother   . Hyperlipidemia Sister   . Kidney cancer Sister   . Breast cancer Neg Hx    Social History   Occupational History  . Not on file.   Social History Main Topics  . Smoking status: Former Smoker -- 0.50 packs/day for 4 years    Types: Cigarettes    Quit date: 12/09/1984  . Smokeless tobacco: Never Used     Comment: quit 12/09/84  . Alcohol Use: 0.0 oz/week    0 Standard drinks or equivalent per week     Comment: sparingly  . Drug Use: No  . Sexual Activity: No    Tobacco Counseling Counseling given: Not Answered   Activities of Daily Living In your present state of health, do you have any difficulty performing the following activities: 03/11/2015  Hearing? N  Vision? N  Difficulty concentrating or making decisions? N  Walking or climbing stairs? N  Dressing or bathing? N  Doing errands, shopping? N  Preparing Food and eating ? N  Using the Toilet? N  In the past six months,  have you accidently leaked urine? N  Do you have problems with loss of bowel control? N  Managing your Medications? N  Managing your Finances? N  Housekeeping or managing your Housekeeping? N    Immunizations and Health Maintenance Immunization History  Administered Date(s) Administered  . DTaP 08/31/2011  . Influenza Split 12/11/2012, 12/13/2013  . Influenza,inj,Quad PF,36+ Mos 11/06/2014  . Zoster 01/29/2011   Health Maintenance Due  Topic Date Due  . TETANUS/TDAP  02/01/1950  . DEXA SCAN  01/31/1998  . PNA vac Low Risk Adult (1 of 2 - PCV13) 01/31/1998    Patient Care Team: Einar Pheasant, MD as PCP - General (Internal Medicine)  Indicate any recent Medical Services you may have received from other than [facility] providers in the past year (date may be approximate).     Assessment:   This is a routine wellness examination for Diana Lester. The goal of  the wellness visit is to assist the patient how to close the gaps in care and create a preventative care plan for the patient.   VIT D Calcium as appropriate/ Osteoporosis risk reviewed.  Medications reviewed; taking without issues or barriers.  Safety issues reviewed; smoke detectors in the home. Firearms locked in a secured area in the home. Wears seatbelts when driving or riding with others. No violence in the home.  DEXA Scan and Pneumococcal vaccine postponed, per patient request to wait for 4 month follow up with PCP.  No identified risk were noted; The patient was oriented x 3; appropriate in dress and manner and no objective failures at ADL's or IADL's.   Malign neopl breast-stable and followed by PCP Malignant neoplasm-stable and followed by PCP Fx dorsal vertebra close-stable and followed by PCP Malig melanoma arm-stable and followed by PCP  Patient Concerns:  None at this time.  4 month follow up appointment scheduled.  Follow up with PCP sooner if needed.   Hearing/Vision screen Hearing Screening Comments: Passes the whisper test Vision Screening Comments: Followed by [facility] Annual visits Wears glasses   Dietary issues and exercise activities discussed: Current Exercise Habits:: Home exercise routine (Mows the lawn, and walks once a week), Type of exercise: walking, Time (Minutes): 20, Intensity: Mild  Goals    . Increase physical activity     Continue walking regiment and add 1 more day to walking the neighborhood.     . Increase water intake     Drink more water on a daily basis.  Currently drinks 1 bottle of water, increase to 2 bottles daily.        Depression Screen PHQ 2/9 Scores 03/11/2015 11/06/2014 08/06/2014 12/06/2012  PHQ - 2 Score 0 0 1 1    Fall Risk Fall Risk  03/11/2015 11/06/2014 08/06/2014 12/06/2012  Falls in the past year? No No No No    Cognitive Function: MMSE - Mini Mental State Exam 03/11/2015  Orientation to time 5    Orientation to Place 5  Registration 3  Attention/ Calculation 5  Recall 3  Language- name 2 objects 2  Language- repeat 1  Language- follow 3 step command 3  Language- read & follow direction 1  Write a sentence 1  Copy design 1  Total score 30    Screening Tests Health Maintenance  Topic Date Due  . TETANUS/TDAP  02/01/1950  . DEXA SCAN  01/31/1998  . PNA vac Low Risk Adult (1 of 2 - PCV13) 01/31/1998  . INFLUENZA VACCINE  10/07/2015  . MAMMOGRAM  11/12/2015  . ZOSTAVAX  Completed      Plan:   End of life planning; Advance aging; Advanced directives discussed. Copy requested of current HCPOA/Living Will.   During the course of the visit, Diana Lester was educated and counseled about the following appropriate screening and preventive services:   Vaccines to include Pneumoccal, Influenza, Hepatitis B, Td, Zostavax, HCV  Electrocardiogram  Cardiovascular disease screening  Colorectal cancer screening  Bone density screening  Diabetes screening  Glaucoma screening  Mammography/PAP  Nutrition counseling  Smoking cessation counseling  Patient Instructions (the written plan) were given to the patient.    Varney Biles, LPN   [id number]    Reviewed above information.  Agree with plan.    Dr Nicki Reaper

## 2015-03-11 NOTE — Progress Notes (Signed)
Pre-visit discussion using our clinic review tool. No additional management support is needed unless otherwise documented below in the visit note.  

## 2015-03-11 NOTE — Assessment & Plan Note (Signed)
Blood pressure under good control.  Continue same medication regimen.  Follow pressures.  Follow metabolic panel.   

## 2015-03-11 NOTE — Assessment & Plan Note (Signed)
S/p lumpectomy and XRT.  Has been released.  Mammogram 11/12/14 - Birads II.

## 2015-03-11 NOTE — Progress Notes (Signed)
[name], female   DOB: 11/13/1930, 82 y.o.   MRN: [id number]   Subjective:    [name], female    DOB: 1930/06/06, 82 y.o.   MRN: [id number]  HPI  Patient with past history of hypercholesterolemia, depression and hypertension.  She comes in today to follow up on these issues.  She reports she is anxious today.  Has some issues with depression and anxiety.  States some days she will feel more anxious than other days.  She is not sure what triggers the increased anxiety.  We discussed this at length today.  No increased depression.  No suicidal ideations.  Has good support from her family.  States not worried about anything in particular.  Tries to stay active.  No cardiac symptoms with increased activity or exertion.  No sob.  No acid reflux reported.  No abdominal pain or cramping.  Bowels stable.     Past Medical History  Diagnosis Date  . Arthritis   . Depression   . Hypercholesterolemia   . Hypertension   . Hx of colonic polyp   . Spinal stenosis     lumbar  . Breast cancer ([place]) 1995    LT LUMPECTOMY & s/p XRT  . Melanoma [facility])    Past Surgical History  Procedure Laterality Date  . Breast lumpectomy Left 1995  . Eye surgery Bilateral     cataract excision  . Breast biopsy Left 2006    EXCISIONAL - NEG  . Breast biopsy Left 1995    LT LUMPECTOMY FOR CA   Family History  Problem Relation Age of Onset  . Arthritis Mother   . Hyperlipidemia Mother   . Hypertension Mother   . Hyperlipidemia Sister   . Kidney cancer Sister   . Breast cancer Neg Hx    Social History   Social History  . Marital Status: Widowed    Spouse Name: N/A  . Number of Children: 2  . Years of Education: N/A   Social History Main Topics  . Smoking status: Former Smoker -- 0.50 packs/day for 4 years    Types: Cigarettes    Quit date: 12/09/1984  . Smokeless tobacco: Never Used     Comment: quit 12/09/84  . Alcohol Use: 0.0 oz/week    0 Standard drinks or  equivalent per week     Comment: sparingly  . Drug Use: No  . Sexual Activity: No   Other Topics Concern  . None   Social History Narrative    Outpatient Encounter Prescriptions as of 03/11/2015  Medication Sig  . aspirin 81 MG tablet Take 81 mg by mouth daily.  . calcium-vitamin D (OSCAL WITH D) 500-200 MG-UNIT per tablet Take 2 tablets by mouth daily.  . clonazePAM (KLONOPIN) 0.5 MG tablet TAKE 1 TABLET BY MOUTH ONE TO TWO TIMES DAILY AS NEEDED  . lisinopril-hydrochlorothiazide (PRINZIDE,ZESTORETIC) 20-12.5 MG tablet TAKE 1 TABLET BY MOUTH EVERY DAY  . lovastatin (MEVACOR) 40 MG tablet Take 1-2 tablets (40-80 mg total) by mouth daily.  . mirtazapine (REMERON) 15 MG tablet TAKE 1 TABLET BY MOUTH EVERY NIGHT AT BEDTIME  . Multiple Vitamin (MULTIVITAMIN) tablet Take 1 tablet by mouth daily.  . pregabalin (LYRICA) 50 MG capsule Take 50 mg by mouth 2 (two) times daily.   . traMADol (ULTRAM) 50 MG tablet Take 25-50 mg by mouth every 8 (eight) hours as needed for pain.   No facility-administered encounter medications on file as of 03/11/2015.  Review of Systems  Constitutional: Negative for appetite change and unexpected weight change.  HENT: Negative for congestion and sinus pressure.   Eyes: Negative for discharge and redness.  Respiratory: Negative for cough, chest tightness and shortness of breath.   Cardiovascular: Negative for chest pain, palpitations and leg swelling.  Gastrointestinal: Negative for nausea, vomiting, abdominal pain and diarrhea.  Genitourinary: Negative for dysuria and difficulty urinating.  Musculoskeletal: Negative for back pain and joint swelling.  Skin: Negative for color change and rash.  Neurological: Negative for dizziness, light-headedness and headaches.  Psychiatric/Behavioral: Negative for suicidal ideas.       Increased anxiety as outlined.  No increased depression.        Objective:    Physical Exam  Constitutional: She appears well-developed  and well-nourished. No distress.  HENT:  Nose: Nose normal.  Mouth/Throat: Oropharynx is clear and moist.  Eyes: Conjunctivae are normal. Right eye exhibits no discharge. Left eye exhibits no discharge.  Neck: Neck supple. No thyromegaly present.  Cardiovascular: Normal rate and regular rhythm.   Pulmonary/Chest: Breath sounds normal. No respiratory distress. She has no wheezes.  Abdominal: Soft. Bowel sounds are normal. There is no tenderness.  Musculoskeletal: She exhibits no edema or tenderness.  Lymphadenopathy:    She has no cervical adenopathy.  Skin: No rash noted. No erythema.  Psychiatric:  Increased anxiety.      BP 130/80 mmHg  Pulse 88  Temp(Src) 98.2 F (36.8 C) (Oral)  Resp 18  Ht 5\' 2"  (1.575 m)  Wt 127 lb 8 oz (57.834 kg)  BMI 23.31 kg/m2  SpO2 95% Wt Readings from Last 3 Encounters:  03/11/15 127 lb 8 oz (57.834 kg)  12/26/14 134 lb (60.782 kg)  11/06/14 124 lb 4 oz (56.359 kg)     Lab Results  Component Value Date   WBC 7.4 11/21/2014   HGB 15.1* 11/21/2014   HCT 45.5 11/21/2014   PLT 229.0 11/21/2014   GLUCOSE 101* 11/21/2014   CHOL 216* 11/21/2014   TRIG 150.0* 11/21/2014   HDL 67.00 11/21/2014   LDLDIRECT 126.7 04/17/2013   LDLCALC 119* 11/21/2014   ALT 11 11/21/2014   AST 18 11/21/2014   NA 142 11/21/2014   K 4.3 11/21/2014   CL 103 11/21/2014   CREATININE 1.42* 11/21/2014   BUN 29* 11/21/2014   CO2 30 11/21/2014   TSH 3.78 11/21/2014       Assessment & Plan:   Problem List Items Addressed This Visit    Breast cancer ([place])    S/p lumpectomy and XRT.  Has been released.  Mammogram 11/12/14 - [facility] II.        Chronic kidney disease (CKD), stage III (moderate)    Followed by nephrology.  Cr 1.3-1.4.  Follow.  Stay hydrated.        Depression    With increased anxiety.  Depression stable.  On clonazepam and remeron.  Discussed with her today.  No suicidal ideations.  Discussed counseling.  She does not feel she needs this at this  point.  She reports she usually feels better if she goes home and lies down.  This relaxes her.  Comfortable to go home.  Will call with update this pm.        Essential hypertension, benign    Blood pressure under good control.  Continue same medication regimen.  Follow pressures.  Follow metabolic panel.        Relevant Orders   Basic metabolic panel   History of  colonic polyps    Colonoscopy 2013 - one polyp.  Followed by Dr [name].        Hypercholesterolemia    Low cholesterol diet and exercise.  On lovastatin.  Follow lipid panel and liver function tests.        Relevant Orders   Lipid panel   Hepatic function panel   Spinal stenosis of lumbar region    Was followed by Dr Sharlet Salina.  Has been released.  Stable.  Does not feel she needs any further intervention at this time.  Follow.        Other Visit Diagnoses    Medicare Annual Wellness, Initial    -  Primary      I spent 25 minutes with the patient and more than 50% of the time was spent in consultation regarding the above.     Einar Pheasant, MD

## 2015-03-11 NOTE — Assessment & Plan Note (Signed)
Low cholesterol diet and exercise.  On lovastatin.  Follow lipid panel and liver function tests.

## 2015-03-11 NOTE — Patient Instructions (Addendum)
Diana Lester,  Thank you for taking time to come for your Medicare Wellness Visit.  I appreciate your ongoing commitment to your health goals. Please review the following plan we discussed and let me know if I can assist you in the future.   Health Maintenance, Female Adopting a healthy lifestyle and getting preventive care can go a long way to promote health and wellness. Talk with your health care provider about what schedule of regular examinations is right for you. This is a good chance for you to check in with your provider about disease prevention and staying healthy. In between checkups, there are plenty of things you can do on your own. Experts have done a lot of research about which lifestyle changes and preventive measures are most likely to keep you healthy. Ask your health care provider for more information. WEIGHT AND DIET  Eat a healthy diet  Be sure to include plenty of vegetables, fruits, low-fat dairy products, and lean protein.  Do not eat a lot of foods high in solid fats, added sugars, or salt.  Get regular exercise. This is one of the most important things you can do for your health.  Most adults should exercise for at least 150 minutes each week. The exercise should increase your heart rate and make you sweat (moderate-intensity exercise).  Most adults should also do strengthening exercises at least twice a week. This is in addition to the moderate-intensity exercise.  Maintain a healthy weight  Body mass index (BMI) is a measurement that can be used to identify possible weight problems. It estimates body fat based on height and weight. Your health care provider can help determine your BMI and help you achieve or maintain a healthy weight.  For females 41 years of age and older:   A BMI below 18.5 is considered underweight.  A BMI of 18.5 to 24.9 is normal.  A BMI of 25 to 29.9 is considered overweight.  A BMI of 30 and above is considered obese.  Watch levels  of cholesterol and blood lipids  You should start having your blood tested for lipids and cholesterol at 80 years of age, then have this test every 5 years.  You may need to have your cholesterol levels checked more often if:  Your lipid or cholesterol levels are high.  You are older than 80 years of age.  You are at high risk for heart disease.  CANCER SCREENING   Lung Cancer  Lung cancer screening is recommended for adults 15-73 years old who are at high risk for lung cancer because of a history of smoking.  A yearly low-dose CT scan of the lungs is recommended for people who:  Currently smoke.  Have quit within the past 15 years.  Have at least a 30-pack-year history of smoking. A pack year is smoking an average of one pack of cigarettes a day for 1 year.  Yearly screening should continue until it has been 15 years since you quit.  Yearly screening should stop if you develop a health problem that would prevent you from having lung cancer treatment.  Breast Cancer  Practice breast self-awareness. This means understanding how your breasts normally appear and feel.  It also means doing regular breast self-exams. Let your health care provider know about any changes, no matter how small.  If you are in your 20s or 30s, you should have a clinical breast exam (CBE) by a health care provider every 1-3 years as part of  a regular health exam.  If you are 27 or older, have a CBE every year. Also consider having a breast X-ray (mammogram) every year.  If you have a family history of breast cancer, talk to your health care provider about genetic screening.  If you are at high risk for breast cancer, talk to your health care provider about having an MRI and a mammogram every year.  Breast cancer gene (BRCA) assessment is recommended for women who have family members with BRCA-related cancers. BRCA-related cancers include:  Breast.  Ovarian.  Tubal.  Peritoneal  cancers.  Results of the assessment will determine the need for genetic counseling and BRCA1 and BRCA2 testing. Cervical Cancer Your health care provider may recommend that you be screened regularly for cancer of the pelvic organs (ovaries, uterus, and vagina). This screening involves a pelvic examination, including checking for microscopic changes to the surface of your cervix (Pap test). You may be encouraged to have this screening done every 3 years, beginning at age 24.  For women ages 65-65, health care providers may recommend pelvic exams and Pap testing every 3 years, or they may recommend the Pap and pelvic exam, combined with testing for human papilloma virus (HPV), every 5 years. Some types of HPV increase your risk of cervical cancer. Testing for HPV may also be done on women of any age with unclear Pap test results.  Other health care providers may not recommend any screening for nonpregnant women who are considered low risk for pelvic cancer and who do not have symptoms. Ask your health care provider if a screening pelvic exam is right for you.  If you have had past treatment for cervical cancer or a condition that could lead to cancer, you need Pap tests and screening for cancer for at least 20 years after your treatment. If Pap tests have been discontinued, your risk factors (such as having a new sexual partner) need to be reassessed to determine if screening should resume. Some women have medical problems that increase the chance of getting cervical cancer. In these cases, your health care provider may recommend more frequent screening and Pap tests. Colorectal Cancer  This type of cancer can be detected and often prevented.  Routine colorectal cancer screening usually begins at 80 years of age and continues through 80 years of age.  Your health care provider may recommend screening at an earlier age if you have risk factors for colon cancer.  Your health care provider may also  recommend using home test kits to check for hidden blood in the stool.  A small camera at the end of a tube can be used to examine your colon directly (sigmoidoscopy or colonoscopy). This is done to check for the earliest forms of colorectal cancer.  Routine screening usually begins at age 10.  Direct examination of the colon should be repeated every 5-10 years through 80 years of age. However, you may need to be screened more often if early forms of precancerous polyps or small growths are found. Skin Cancer  Check your skin from head to toe regularly.  Tell your health care provider about any new moles or changes in moles, especially if there is a change in a mole's shape or color.  Also tell your health care provider if you have a mole that is larger than the size of a pencil eraser.  Always use sunscreen. Apply sunscreen liberally and repeatedly throughout the day.  Protect yourself by wearing long sleeves, pants, a wide-brimmed  hat, and sunglasses whenever you are outside. HEART DISEASE, DIABETES, AND HIGH BLOOD PRESSURE   High blood pressure causes heart disease and increases the risk of stroke. High blood pressure is more likely to develop in:  People who have blood pressure in the high end of the normal range (130-139/85-89 mm Hg).  People who are overweight or obese.  People who are African American.  If you are 42-48 years of age, have your blood pressure checked every 3-5 years. If you are 103 years of age or older, have your blood pressure checked every year. You should have your blood pressure measured twice--once when you are at a hospital or clinic, and once when you are not at a hospital or clinic. Record the average of the two measurements. To check your blood pressure when you are not at a hospital or clinic, you can use:  An automated blood pressure machine at a pharmacy.  A home blood pressure monitor.  If you are between 39 years and 54 years old, ask your health  care provider if you should take aspirin to prevent strokes.  Have regular diabetes screenings. This involves taking a blood sample to check your fasting blood sugar level.  If you are at a normal weight and have a low risk for diabetes, have this test once every three years after 80 years of age.  If you are overweight and have a high risk for diabetes, consider being tested at a younger age or more often. PREVENTING INFECTION  Hepatitis B  If you have a higher risk for hepatitis B, you should be screened for this virus. You are considered at high risk for hepatitis B if:  You were born in a country where hepatitis B is common. Ask your health care provider which countries are considered high risk.  Your parents were born in a high-risk country, and you have not been immunized against hepatitis B (hepatitis B vaccine).  You have HIV or AIDS.  You use needles to inject street drugs.  You live with someone who has hepatitis B.  You have had sex with someone who has hepatitis B.  You get hemodialysis treatment.  You take certain medicines for conditions, including cancer, organ transplantation, and autoimmune conditions. Hepatitis C  Blood testing is recommended for:  Everyone born from [birth year] through 1963.  Anyone with known risk factors for hepatitis C. Sexually transmitted infections (STIs)  You should be screened for sexually transmitted infections (STIs) including gonorrhea and chlamydia if:  You are sexually active and are younger than 80 years of age.  You are older than 80 years of age and your health care provider tells you that you are at risk for this type of infection.  Your sexual activity has changed since you were last screened and you are at an increased risk for chlamydia or gonorrhea. Ask your health care provider if you are at risk.  If you do not have HIV, but are at risk, it may be recommended that you take a prescription medicine daily to prevent HIV  infection. This is called pre-exposure prophylaxis (PrEP). You are considered at risk if:  You are sexually active and do not regularly use condoms or know the HIV status of your partner(s).  You take drugs by injection.  You are sexually active with a partner who has HIV. Talk with your health care provider about whether you are at high risk of being infected with HIV. If you choose to begin PrEP, you  should first be tested for HIV. You should then be tested every 3 months for as long as you are taking PrEP.  PREGNANCY   If you are premenopausal and you may become pregnant, ask your health care provider about preconception counseling.  If you may become pregnant, take 400 to 800 micrograms (mcg) of folic acid every day.  If you want to prevent pregnancy, talk to your health care provider about birth control (contraception). OSTEOPOROSIS AND MENOPAUSE   Osteoporosis is a disease in which the bones lose minerals and strength with aging. This can result in serious bone fractures. Your risk for osteoporosis can be identified using a bone density scan.  If you are 29 years of age or older, or if you are at risk for osteoporosis and fractures, ask your health care provider if you should be screened.  Ask your health care provider whether you should take a calcium or vitamin D supplement to lower your risk for osteoporosis.  Menopause may have certain physical symptoms and risks.  Hormone replacement therapy may reduce some of these symptoms and risks. Talk to your health care provider about whether hormone replacement therapy is right for you.  HOME CARE INSTRUCTIONS   Schedule regular health, dental, and eye exams.  Stay current with your immunizations.   Do not use any tobacco products including cigarettes, chewing tobacco, or electronic cigarettes.  If you are pregnant, do not drink alcohol.  If you are breastfeeding, limit how much and how often you drink alcohol.  Limit  alcohol intake to no more than 1 drink per day for nonpregnant women. One drink equals 12 ounces of beer, 5 ounces of wine, or 1 ounces of hard liquor.  Do not use street drugs.  Do not share needles.  Ask your health care provider for help if you need support or information about quitting drugs.  Tell your health care provider if you often feel depressed.  Tell your health care provider if you have ever been abused or do not feel safe at home.   This information is not intended to replace advice given to you by your health care provider. Make sure you discuss any questions you have with your health care provider.   Document Released: 09/07/2010 Document Revised: 03/15/2014 Document Reviewed: 01/24/2013 Elsevier Interactive Patient Education [employer].

## 2015-03-11 NOTE — Assessment & Plan Note (Signed)
Colonoscopy 2013 - one polyp.  Followed by Dr [name].

## 2015-03-25 ENCOUNTER — Other Ambulatory Visit (INDEPENDENT_AMBULATORY_CARE_PROVIDER_SITE_OTHER): Payer: PPO

## 2015-03-25 DIAGNOSIS — E7800 Pure hypercholesterolemia, unspecified: Secondary | ICD-10-CM

## 2015-03-25 DIAGNOSIS — I10 Essential (primary) hypertension: Secondary | ICD-10-CM

## 2015-03-25 LAB — LIPID PANEL
Cholesterol: 206 mg/dL — ABNORMAL HIGH (ref 0–200)
HDL: 62.8 mg/dL (ref >39.00)
LDL CALC: 104 mg/dL — AB (ref 0–99)
NONHDL: 143.08
TRIGLYCERIDES: 193 mg/dL — AB (ref 0.0–149.0)
Total CHOL/HDL Ratio: 3
VLDL: 38.6 mg/dL (ref 0.0–40.0)

## 2015-03-25 LAB — HEPATIC FUNCTION PANEL
ALT: 12 U/L (ref 0–35)
AST: 20 U/L (ref 0–37)
Albumin: 4.1 g/dL (ref 3.5–5.2)
Alkaline Phosphatase: 62 U/L (ref 39–117)
BILIRUBIN DIRECT: 0.1 mg/dL (ref 0.0–0.3)
TOTAL PROTEIN: 6.5 g/dL (ref 6.0–8.3)
Total Bilirubin: 0.4 mg/dL (ref 0.2–1.2)

## 2015-03-25 LAB — BASIC METABOLIC PANEL
BUN: 19 mg/dL (ref 6–23)
CO2: 32 meq/L (ref 19–32)
Calcium: 9.8 mg/dL (ref 8.4–10.5)
Chloride: 102 mEq/L (ref 96–112)
Creatinine, Ser: 1.39 mg/dL — ABNORMAL HIGH (ref 0.40–1.20)
GFR: 38.57 mL/min — ABNORMAL LOW (ref >60.00)
Glucose, Bld: 95 mg/dL (ref 70–99)
Potassium: 4.1 mEq/L (ref 3.5–5.1)
Sodium: 141 mEq/L (ref 135–145)

## 2015-03-26 ENCOUNTER — Encounter: Payer: Self-pay | Admitting: *Deleted

## 2015-04-01 ENCOUNTER — Telehealth: Payer: Self-pay | Admitting: *Deleted

## 2015-04-01 NOTE — Telephone Encounter (Signed)
FYI : Patient stated that she received a call, questioning her vaccines,she has not had a pneumonia vaccine. She had no interest

## 2015-04-17 ENCOUNTER — Other Ambulatory Visit: Payer: Self-pay | Admitting: Internal Medicine

## 2015-04-17 NOTE — Telephone Encounter (Signed)
Pt is requesting Klonopin. Pt last OV 03/11/15, last filled 12/30/14 #[id number] with 0refills. Please advise,thanks

## 2015-04-18 ENCOUNTER — Other Ambulatory Visit: Payer: Self-pay | Admitting: Internal Medicine

## 2015-04-18 NOTE — Telephone Encounter (Signed)
Rx faxed

## 2015-04-18 NOTE — Telephone Encounter (Signed)
ok'd refill clonazepam #135 with no refills.

## 2015-04-18 NOTE — Telephone Encounter (Signed)
Last OV: 03/11/15  Next OV:07/09/15 Okay to refill?

## 2015-04-19 NOTE — Telephone Encounter (Signed)
Already refilled and signed rx.

## 2015-05-10 ENCOUNTER — Other Ambulatory Visit: Payer: Self-pay | Admitting: Internal Medicine

## 2015-07-05 ENCOUNTER — Ambulatory Visit
Admission: EM | Admit: 2015-07-05 | Discharge: 2015-07-05 | Disposition: A | Discharge status: Home / Self Care | Payer: PPO | Attending: Family Medicine | Admitting: Family Medicine

## 2015-07-05 DIAGNOSIS — T2124XA Burn of second degree of lower back, initial encounter: Secondary | ICD-10-CM

## 2015-07-05 MED ORDER — SILVER SULFADIAZINE 1 % EX CREA: 1.0000 "application " | TOPICAL_CREAM | Freq: Two times a day (BID) | CUTANEOUS | Status: DC

## 2015-07-05 NOTE — ED Provider Notes (Signed)
CSN: [id number]     Arrival date & time 07/05/15  [id number] History   None    Chief Complaint  Patient presents with  . Skin Problem    Left hip reddened with blistering noted. Started two days ago. Only hurts when something is touching it.    (Consider location/radiation/quality/duration/timing/severity/associated sxs/prior Treatment) HPI Comments: 82 yo female with a c/o a blistering skin area on her left lower back that she noticed yesterday. States area is not painful unless touched. Denies any burning sensation or pain radiating. States that she uses a heating pad daily for her chronic low back pain but only in the morning. States immunizations are up to date.   The history is provided by the patient.    Past Medical History  Diagnosis Date  . Arthritis   . Depression   . Hypercholesterolemia   . Hypertension   . Hx of colonic polyp   . Spinal stenosis     lumbar  . Breast cancer ([place]) 1995    LT LUMPECTOMY & s/p XRT  . Melanoma [facility])    Past Surgical History  Procedure Laterality Date  . Breast lumpectomy Left 1995  . Eye surgery Bilateral     cataract excision  . Breast biopsy Left 2006    EXCISIONAL - NEG  . Breast biopsy Left 1995    LT LUMPECTOMY FOR CA   Family History  Problem Relation Age of Onset  . Arthritis Mother   . Hyperlipidemia Mother   . Hypertension Mother   . Hyperlipidemia Sister   . Kidney cancer Sister   . Breast cancer Neg Hx    Social History  Substance Use Topics  . Smoking status: Former Smoker -- 0.50 packs/day for 4 years    Types: Cigarettes    Quit date: 12/09/1984  . Smokeless tobacco: Never Used     Comment: quit 12/09/84  . Alcohol Use: 0.0 oz/week    0 Standard drinks or equivalent per week     Comment: sparingly   OB History    No data available     Review of Systems  Allergies  Contrast media  Home Medications   Prior to Admission medications   Medication Sig Start Date End Date Taking? Authorizing Provider    aspirin 81 MG tablet Take 81 mg by mouth daily.   Yes Historical Provider, MD  calcium-vitamin D (OSCAL WITH D) 500-200 MG-UNIT per tablet Take 2 tablets by mouth daily.   Yes Historical Provider, MD  clonazePAM (KLONOPIN) 0.5 MG tablet TAKE 1 TABLET BY MOUTH EVERY DAY TO TWICE DAILY AS NEEDED 04/17/15  Yes Einar Pheasant, MD  lisinopril-hydrochlorothiazide (PRINZIDE,ZESTORETIC) 20-12.5 MG tablet TAKE 1 TABLET BY MOUTH EVERY DAY 05/11/15  Yes Einar Pheasant, MD  Multiple Vitamin (MULTIVITAMIN) tablet Take 1 tablet by mouth daily.   Yes Historical Provider, MD  pregabalin (LYRICA) 50 MG capsule Take 50 mg by mouth 2 (two) times daily.    Yes Historical Provider, MD  traMADol (ULTRAM) 50 MG tablet Take 25-50 mg by mouth every 8 (eight) hours as needed for pain.   Yes Historical Provider, MD  lovastatin (MEVACOR) 40 MG tablet TAKE 1-2 TABLETS BY MOUTH DAILY 07/12/15   Einar Pheasant, MD  mirtazapine (REMERON) 30 MG tablet Take 1 tablet (30 mg total) by mouth at bedtime. 07/14/15   Leone Haven, MD  silver sulfADIAZINE (SILVADENE) 1 % cream Apply 1 application topically 2 (two) times daily. 07/05/15   Norval Gable, MD  Meds Ordered and Administered this Visit  Medications - No data to display  BP 114/55 mmHg  Pulse 95  Temp(Src) 98.4 F (36.9 C)  Resp 18  Ht 5\' 2"  (1.575 m)  Wt 131 lb (59.421 kg)  BMI 23.95 kg/m2  SpO2 99% No data found.   Physical Exam  Constitutional: She appears well-developed and well-nourished. No distress.  Skin: Burn noted. She is not diaphoretic.     4.5cmx3cm erythematous oval skin lesion with an intact blister approx 1cm and an open blister with skin pulled back; no purulent drainage  Nursing note and vitals reviewed.   ED Course  Procedures (including critical care time)  Labs Review Labs Reviewed - No data to display  Imaging Review No results found.   Visual Acuity Review  Right Eye Distance:   Left Eye Distance:   Bilateral Distance:     Right Eye Near:   Left Eye Near:    Bilateral Near:          MDM   1. Partial thickness burn of back    Discharge Medication List as of 07/05/2015 10:12 AM    START taking these medications   Details  silver sulfADIAZINE (SILVADENE) 1 % cream Apply 1 application topically 2 (two) times daily., Starting 07/05/2015, Until Discontinued, Normal       1. diagnosis reviewed with patient 2. rx as per orders above; reviewed possible side effects, interactions, risks and benefits  3. Follow-up with PCP next week for recheck or here prn if symptoms worsen or don't improve   Norval Gable, MD 07/14/15 2217

## 2015-07-09 ENCOUNTER — Ambulatory Visit: Payer: PPO | Admitting: Internal Medicine

## 2015-07-09 DIAGNOSIS — Z0289 Encounter for other administrative examinations: Secondary | ICD-10-CM

## 2015-07-10 ENCOUNTER — Telehealth: Payer: Self-pay | Admitting: *Deleted

## 2015-07-10 NOTE — Telephone Encounter (Signed)
Dr. Nicki Reaper, Please advise, I don't see anything except for your 830 tomorrow that is on hold. thanks

## 2015-07-10 NOTE — Telephone Encounter (Signed)
Patient is having anxiety, she would like an appt.on 07/11/15, she only want so to see a female provider. Please advise.

## 2015-07-10 NOTE — Telephone Encounter (Signed)
She had an appt yesterday and no showed.  The am spot tomorrow is taken.  They have already added an 4:30 spot tomorrow as well.  I am unable to work her in tomorrow, but can work her in Wednesday 07/16/15 at 1:00.  If acute symptoms, will need to be seen prior and then I can f/u with her.

## 2015-07-10 NOTE — Telephone Encounter (Signed)
Please advise patient, thanks.

## 2015-07-12 ENCOUNTER — Other Ambulatory Visit: Payer: Self-pay | Admitting: Internal Medicine

## 2015-07-14 ENCOUNTER — Encounter: Payer: Self-pay | Admitting: Family Medicine

## 2015-07-14 ENCOUNTER — Ambulatory Visit (INDEPENDENT_AMBULATORY_CARE_PROVIDER_SITE_OTHER): Payer: PPO | Admitting: Family Medicine

## 2015-07-14 VITALS — BP 124/76 | HR 86 | Temp 98.3°F | Ht 62.0 in | Wt 128.2 lb

## 2015-07-14 DIAGNOSIS — F329 Major depressive disorder, single episode, unspecified: Secondary | ICD-10-CM | POA: Diagnosis not present

## 2015-07-14 DIAGNOSIS — Z5181 Encounter for therapeutic drug level monitoring: Secondary | ICD-10-CM

## 2015-07-14 DIAGNOSIS — F32A Depression, unspecified: Secondary | ICD-10-CM

## 2015-07-14 DIAGNOSIS — T2104XD Burn of unspecified degree of lower back, subsequent encounter: Secondary | ICD-10-CM | POA: Diagnosis not present

## 2015-07-14 DIAGNOSIS — T300 Burn of unspecified body region, unspecified degree: Secondary | ICD-10-CM

## 2015-07-14 LAB — BASIC METABOLIC PANEL
BUN: 17 mg/dL (ref 6–23)
CALCIUM: 10.1 mg/dL (ref 8.4–10.5)
CO2: 29 meq/L (ref 19–32)
Chloride: 103 mEq/L (ref 96–112)
Creatinine, Ser: 1.42 mg/dL — ABNORMAL HIGH (ref 0.40–1.20)
GFR: 37.6 mL/min — AB (ref >60.00)
Glucose, Bld: 132 mg/dL — ABNORMAL HIGH (ref 70–99)
Potassium: 4.4 mEq/L (ref 3.5–5.1)
SODIUM: 141 meq/L (ref 135–145)

## 2015-07-14 MED ORDER — MIRTAZAPINE 30 MG PO TABS: 30.0000 mg | ORAL_TABLET | Freq: Every day | ORAL | Status: DC

## 2015-07-14 NOTE — Assessment & Plan Note (Addendum)
Depression slightly worse. With increased anxiety as well. Taking clonazepam though typically takes only once daily. Also on Remeron. No SI or HI. Discussed going to see therapist versus medication changes. Patient opted for medication changes. We will increase Remeron to 30 mg daily at bedtime. I encouraged the patient is a clonazepam twice daily if she needs it. Advised patient that these medication changes could make her drowsy and that this occurred she needed to let us know. Also advised that she needed to let us know if there is any confusion. We'll check a BMP given the increased dose of Remeron given her age. She is given return precautions.

## 2015-07-14 NOTE — Progress Notes (Signed)
Pre visit review using our clinic review tool, if applicable. No additional management support is needed unless otherwise documented below in the visit note. 

## 2015-07-14 NOTE — Patient Instructions (Signed)
Nice to see you. We are going to increase your mirtazapine to 30 mg daily to treat her anxiety and depression. You can take Klonopin once in the morning and once in the afternoon or evening for your anxiety. If you would like to see a therapist please let us know. Please monitor the spot on her back in if you develop fevers, drainage, or pain please let us know. If you develop thoughts of harming herself or others please seek medical attention immediately.

## 2015-07-14 NOTE — Assessment & Plan Note (Signed)
Area on back consistent with well-healing burn. No signs of infection. 2 areas of scabbing appeared to be well-healing. No tenderness. She'll continue Silvadene cream. She'll continue to monitor the area. She is given return precautions.

## 2015-07-14 NOTE — Progress Notes (Signed)
Patient ID: DONNALYN TIESZEN, female   DOB: August 28, 1930, 82 y.o.   MRN: [id number]  Tommi Rumps, MD Phone: [phone]  Diana Lester is a 82 y.o. female who presents today for same-day visit.  Anxiety: Patient notes intermittent episodes of anxiety for a number of years. Just about anything can make her anxious. She notes it's particularly bad when things don't go as planned. Also notes some depression off and on for years. It is a little worse recently. No SI or HI. She takes Klonopin about once a day. Also taking Remeron at night. She sleeps well.  Patient was seen at urgent care about a week ago for a raw spot on her left low back. It was felt to be a burn from a heating pad. This has been healing well and she's been using Silvadene cream on it. No drainage or fever.  PMH: Former smoker  ROS see history of present illness  Objective  Physical Exam Filed Vitals:   07/14/15 0802  BP: 124/76  Pulse: 86  Temp: 98.3 F (36.8 C)    BP Readings from Last 3 Encounters:  07/14/15 124/76  07/05/15 114/55  03/11/15 130/80   Wt Readings from Last 3 Encounters:  07/14/15 128 lb 3.2 oz (58.151 kg)  07/05/15 131 lb (59.421 kg)  03/11/15 127 lb 8 oz (57.834 kg)    Physical Exam  Constitutional: No distress.  HENT:  Head: Normocephalic and atraumatic.  Right Ear: External ear normal.  Left Ear: External ear normal.  Cardiovascular: Normal rate, regular rhythm and normal heart sounds.   Pulmonary/Chest: Effort normal and breath sounds normal.  Neurological: She is alert. Gait normal.  Skin: She is not diaphoretic.     Psychiatric:  Mood anxious and depressed, affect anxious     Assessment/Plan: Please see individual problem list.  Depression Depression slightly worse. With increased anxiety as well. Taking clonazepam though typically takes only once daily. Also on Remeron. No SI or HI. Discussed going to see therapist versus medication changes. Patient opted for  medication changes. We will increase Remeron to 30 mg daily at bedtime. I encouraged the patient is a clonazepam twice daily if she needs it. Advised patient that these medication changes could make her drowsy and that this occurred she needed to let us know. Also advised that she needed to let us know if there is any confusion. We'll check a BMP given the increased dose of Remeron given her age. She is given return precautions.  Burn Area on back consistent with well-healing burn. No signs of infection. 2 areas of scabbing appeared to be well-healing. No tenderness. She'll continue Silvadene cream. She'll continue to monitor the area. She is given return precautions.    Orders Placed This Encounter  Procedures  . Basic Metabolic Panel (BMET)    Meds ordered this encounter  Medications  . mirtazapine (REMERON) 30 MG tablet    Sig: Take 1 tablet (30 mg total) by mouth at bedtime.    Dispense:  90 tablet    Refill:  0    Tommi Rumps, MD [place]

## 2015-07-16 ENCOUNTER — Ambulatory Visit: Payer: PPO | Admitting: Internal Medicine

## 2015-08-18 ENCOUNTER — Other Ambulatory Visit: Payer: Self-pay | Admitting: Internal Medicine

## 2015-08-18 NOTE — Telephone Encounter (Signed)
Received refill electronically Last refill 04/17/15 #135 Last office visit 07/14/15/acute

## 2015-08-19 NOTE — Telephone Encounter (Signed)
ok'd refill clonazepam #135 with no refills.

## 2015-08-20 NOTE — Telephone Encounter (Signed)
"  Phone in" was selected. Rx not printed

## 2015-08-23 ENCOUNTER — Other Ambulatory Visit: Payer: Self-pay | Admitting: Internal Medicine

## 2015-08-25 ENCOUNTER — Telehealth: Payer: Self-pay

## 2015-08-25 NOTE — Telephone Encounter (Signed)
Spoke to patients sister and sent in the refill for Klonopin. Patient will need to Follow up with PCP for refill on lyrica due to it being an older medication order from [facility] health care. Patients sister understood and had no qustions comments or concerns at this time.

## 2015-10-01 ENCOUNTER — Other Ambulatory Visit: Payer: Self-pay | Admitting: Internal Medicine

## 2015-10-01 DIAGNOSIS — Z1231 Encounter for screening mammogram for malignant neoplasm of breast: Secondary | ICD-10-CM

## 2015-10-20 ENCOUNTER — Encounter: Payer: Self-pay | Admitting: Internal Medicine

## 2015-10-20 ENCOUNTER — Ambulatory Visit (INDEPENDENT_AMBULATORY_CARE_PROVIDER_SITE_OTHER): Payer: PPO | Admitting: Internal Medicine

## 2015-10-20 VITALS — BP 120/80 | HR 88 | Temp 98.5°F | Resp 18 | Ht 62.0 in | Wt 123.5 lb

## 2015-10-20 DIAGNOSIS — N281 Cyst of kidney, acquired: Secondary | ICD-10-CM

## 2015-10-20 DIAGNOSIS — R634 Abnormal weight loss: Secondary | ICD-10-CM

## 2015-10-20 DIAGNOSIS — N183 Chronic kidney disease, stage 3 unspecified: Secondary | ICD-10-CM

## 2015-10-20 DIAGNOSIS — F419 Anxiety disorder, unspecified: Secondary | ICD-10-CM | POA: Diagnosis not present

## 2015-10-20 DIAGNOSIS — Z860100 Personal history of colon polyps, unspecified: Secondary | ICD-10-CM

## 2015-10-20 DIAGNOSIS — Q6100 Congenital renal cyst, unspecified: Secondary | ICD-10-CM

## 2015-10-20 DIAGNOSIS — E7800 Pure hypercholesterolemia, unspecified: Secondary | ICD-10-CM | POA: Diagnosis not present

## 2015-10-20 DIAGNOSIS — F329 Major depressive disorder, single episode, unspecified: Secondary | ICD-10-CM

## 2015-10-20 DIAGNOSIS — I10 Essential (primary) hypertension: Secondary | ICD-10-CM | POA: Diagnosis not present

## 2015-10-20 DIAGNOSIS — Z853 Personal history of malignant neoplasm of breast: Secondary | ICD-10-CM

## 2015-10-20 DIAGNOSIS — F32A Depression, unspecified: Secondary | ICD-10-CM

## 2015-10-20 NOTE — Progress Notes (Signed)
[name], female   DOB: [birth year], 82 y.o.   MRN: [id number]   Subjective:    [name], female    DOB: May 25, 1930, 82 y.o.   MRN: [id number]  HPI  Patient here for a scheduled follow up.  Has a history of increased anxiety.  Was seen recently and remeron was increased to 30mg  qhs.  She has been taking this on a regular basis.  Also has clonazepam to take if needed.  Usually takes one in the am.  Still with increased anxiety.  Worse at times.  Some increase recently with her sister's health issues.  No chest pain.  Tries to stay active.  No sob.  Has lost some weight.  States eats regular meals.  No nausea or vomiting.  Bowels stable.  No abdominal pain or cramping.     Past Medical History:  Diagnosis Date  . Arthritis   . Breast cancer ([place]) 1995   LT LUMPECTOMY & s/p XRT  . Depression   . Hx of colonic polyp   . Hypercholesterolemia   . Hypertension   . Melanoma ([place])   . Spinal stenosis    lumbar   Past Surgical History:  Procedure Laterality Date  . BREAST BIOPSY Left 2006   EXCISIONAL - NEG  . BREAST BIOPSY Left 1995   LT LUMPECTOMY FOR CA  . BREAST LUMPECTOMY Left 1995  . EYE SURGERY Bilateral    cataract excision   Family History  Problem Relation Age of Onset  . Arthritis Mother   . Hyperlipidemia Mother   . Hypertension Mother   . Hyperlipidemia Sister   . Kidney cancer Sister   . Breast cancer Neg Hx    Social History   Social History  . Marital status: Widowed    Spouse name: N/A  . Number of children: 2  . Years of education: N/A   Social History Main Topics  . Smoking status: Former Smoker    Packs/day: 0.50    Years: 4.00    Types: Cigarettes    Quit date: 12/09/1984  . Smokeless tobacco: Never Used     Comment: quit 12/09/84  . Alcohol use 0.0 oz/week     Comment: sparingly  . Drug use: No  . Sexual activity: No   Other Topics Concern  . None   Social History Narrative  . None    Outpatient  Encounter Prescriptions as of 10/20/2015  Medication Sig  . aspirin 81 MG tablet Take 81 mg by mouth daily.  . calcium-vitamin D (OSCAL WITH D) 500-200 MG-UNIT per tablet Take 2 tablets by mouth daily.  . clonazePAM (KLONOPIN) 0.5 MG tablet TAKE 1 TABLET BY MOUTH EVERY DAY TO TWICE DAILY AS NEEDED.  Marland Kitchen lisinopril-hydrochlorothiazide (PRINZIDE,ZESTORETIC) 20-12.5 MG tablet TAKE 1 TABLET BY MOUTH EVERY DAY  . lovastatin (MEVACOR) 40 MG tablet TAKE 1-2 TABLETS BY MOUTH DAILY  . mirtazapine (REMERON) 30 MG tablet Take 1 tablet (30 mg total) by mouth at bedtime.  . Multiple Vitamin (MULTIVITAMIN) tablet Take 1 tablet by mouth daily.  . pregabalin (LYRICA) 50 MG capsule Take 50 mg by mouth 2 (two) times daily.   . traMADol (ULTRAM) 50 MG tablet Take 25-50 mg by mouth every 8 (eight) hours as needed for pain.  . [DISCONTINUED] silver sulfADIAZINE (SILVADENE) 1 % cream Apply 1 application topically 2 (two) times daily.   No facility-administered encounter medications on file as of 10/20/2015.     Review of Systems  Constitutional: Negative for appetite change.       Has lost some weight.  States eating regular meals.   HENT: Negative for congestion and sinus pressure.   Respiratory: Negative for cough, chest tightness and shortness of breath.   Cardiovascular: Negative for chest pain, palpitations and leg swelling.  Gastrointestinal: Negative for abdominal pain, diarrhea, nausea and vomiting.  Genitourinary: Negative for difficulty urinating and dysuria.  Musculoskeletal: Negative for back pain and joint swelling.  Skin: Negative for color change and rash.  Neurological: Negative for dizziness, light-headedness and headaches.  Psychiatric/Behavioral: Negative for dysphoric mood and suicidal ideas.       Increased anxiety as outlined.         Objective:    Physical Exam  Constitutional: She appears well-developed and well-nourished. No distress.  HENT:  Nose: Nose normal.  Mouth/Throat:  Oropharynx is clear and moist.  Neck: Neck supple. No thyromegaly present.  Cardiovascular: Normal rate and regular rhythm.   Pulmonary/Chest: Breath sounds normal. No respiratory distress. She has no wheezes.  Abdominal: Soft. Bowel sounds are normal. There is no tenderness.  Musculoskeletal: She exhibits no edema or tenderness.  Lymphadenopathy:    She has no cervical adenopathy.  Skin: No rash noted. No erythema.  Psychiatric: She has a normal mood and affect. Her behavior is normal.    BP 120/80   Pulse 88   Temp 98.5 F (36.9 C) (Oral)   Resp 18   Ht 5\' 2"  (1.575 m)   Wt 123 lb 8 oz (56 kg)   SpO2 95%   BMI 22.59 kg/m  Wt Readings from Last 3 Encounters:  10/20/15 123 lb 8 oz (56 kg)  07/14/15 128 lb 3.2 oz (58.2 kg)  07/05/15 131 lb (59.4 kg)     Lab Results  Component Value Date   WBC 7.4 11/21/2014   HGB 15.1 (H) 11/21/2014   HCT 45.5 11/21/2014   PLT 229.0 11/21/2014   GLUCOSE 132 (H) 07/14/2015   CHOL 206 (H) 03/25/2015   TRIG 193.0 (H) 03/25/2015   HDL 62.80 03/25/2015   LDLDIRECT 126.7 04/17/2013   LDLCALC 104 (H) 03/25/2015   ALT 12 03/25/2015   AST 20 03/25/2015   NA 141 07/14/2015   K 4.4 07/14/2015   CL 103 07/14/2015   CREATININE 1.42 (H) 07/14/2015   BUN 17 07/14/2015   CO2 29 07/14/2015   TSH 3.78 11/21/2014       Assessment & Plan:   Problem List Items Addressed This Visit    Anxiety    Persistent increased anxiety.  Discussed with her today.  On remeron and clonazepam.  Given persistent despite medication and increased dose recently, will refer to psychiatry for further evaluation and treatment.  Pt agreeable.        Relevant Orders   Ambulatory referral to Psychiatry   Chronic kidney disease (CKD), stage III (moderate)    Creatinine has been stable - 1.3-1.4.  Sees nephrology.  Has f/u soon.        Depression    Increased anxiety.  No significant depression.  Discussed with her today.  Refer to psychiatry as outlined.  No  suicidal ideations.        Essential hypertension, benign    Blood pressure under good control.  Continue same medication regimen.  Follow pressures.  Follow metabolic panel.        Relevant Orders   Basic metabolic panel   History of breast cancer    S/p lumpectomy and  XRT.  Has been released.  Mammogram 9//6/16 - Birads II.  States already scheduled for f/u mammogram.        History of colonic polyps    Colonoscopy 2013.  Followed by Dr [name].       Hypercholesterolemia - Primary    On lovastatin.  Check lipid panel and liver function tests with next labs.       Relevant Orders   Lipid panel   Hepatic function panel   Loss of weight    Discussed with her today.  She is eating.  No GI issues.  Refer to psych.  See if can get the anxiety under better control.  Follow weight.  Get her back in soon to reassess.        Relevant Orders   CBC with Differential/Platelet   TSH   Renal cyst    Continue f/u with nephrology.        Other Visit Diagnoses   None.      Einar Pheasant, MD

## 2015-10-20 NOTE — Progress Notes (Signed)
Pre-visit discussion using our clinic review tool. No additional management support is needed unless otherwise documented below in the visit note.  

## 2015-10-21 ENCOUNTER — Encounter: Payer: Self-pay | Admitting: Internal Medicine

## 2015-10-21 NOTE — Assessment & Plan Note (Signed)
Colonoscopy 2013.  Followed by Dr [name].

## 2015-10-21 NOTE — Assessment & Plan Note (Signed)
Persistent increased anxiety.  Discussed with her today.  On remeron and clonazepam.  Given persistent despite medication and increased dose recently, will refer to psychiatry for further evaluation and treatment.  Pt agreeable.

## 2015-10-21 NOTE — Assessment & Plan Note (Signed)
On lovastatin.  Check lipid panel and liver function tests with next labs.

## 2015-10-21 NOTE — Assessment & Plan Note (Signed)
Discussed with her today.  She is eating.  No GI issues.  Refer to psych.  See if can get the anxiety under better control.  Follow weight.  Get her back in soon to reassess.

## 2015-10-21 NOTE — Assessment & Plan Note (Signed)
Creatinine has been stable - 1.3-1.4.  Sees nephrology.  Has f/u soon.

## 2015-10-21 NOTE — Assessment & Plan Note (Signed)
S/p lumpectomy and XRT.  Has been released.  Mammogram 9//6/16 - Birads II.  States already scheduled for f/u mammogram.

## 2015-10-21 NOTE — Assessment & Plan Note (Signed)
Continue f/u with nephrology

## 2015-10-21 NOTE — Assessment & Plan Note (Signed)
Increased anxiety.  No significant depression.  Discussed with her today.  Refer to psychiatry as outlined.  No suicidal ideations.

## 2015-10-21 NOTE — Assessment & Plan Note (Signed)
Blood pressure under good control.  Continue same medication regimen.  Follow pressures.  Follow metabolic panel.   

## 2015-10-25 ENCOUNTER — Other Ambulatory Visit: Payer: Self-pay | Admitting: Family Medicine

## 2015-11-07 ENCOUNTER — Telehealth: Payer: Self-pay | Admitting: *Deleted

## 2015-11-07 ENCOUNTER — Ambulatory Visit
Admission: EM | Admit: 2015-11-07 | Discharge: 2015-11-07 | Disposition: A | Discharge status: Home / Self Care | Payer: PPO | Attending: Emergency Medicine | Admitting: Emergency Medicine

## 2015-11-07 DIAGNOSIS — R350 Frequency of micturition: Secondary | ICD-10-CM | POA: Diagnosis not present

## 2015-11-07 LAB — URINALYSIS COMPLETE WITH MICROSCOPIC (ARMC ONLY)
BACTERIA UA: NONE SEEN
BILIRUBIN URINE: NEGATIVE
Glucose, UA: NEGATIVE mg/dL
Hgb urine dipstick: NEGATIVE
KETONES UR: NEGATIVE mg/dL
LEUKOCYTES UA: NEGATIVE
NITRITE: NEGATIVE
PROTEIN: NEGATIVE mg/dL
SPECIFIC GRAVITY, URINE: 1.01 (ref 1.005–1.030)
pH: 5.5 (ref 5.0–8.0)

## 2015-11-07 LAB — GLUCOSE, CAPILLARY: Glucose-Capillary: 115 mg/dL — ABNORMAL HIGH (ref 65–99)

## 2015-11-07 NOTE — Telephone Encounter (Signed)
Patient has been informed.

## 2015-11-07 NOTE — Telephone Encounter (Signed)
I can see her at 12:00 on 11/13/15.  Confirm no other acute issues.  Per note, just urinary frequency.

## 2015-11-07 NOTE — ED Provider Notes (Signed)
[facility] ____________________________________________  Time seen: Approximately 9:56 AM  I have reviewed the triage vital signs and the nursing notes.   HISTORY  Chief Complaint Urinary Tract Infection  HPI Diana Lester is a 82 y.o. female presents for the complaint of urinary frequency. Patient reports urinating more frequently yesterday. States normal urination today. Patient denies pain with urination, vaginal complaints, back pain or abdominal pain. Patient reports yesterday she noticed a slight pressure to her very low midline abdomen but denies pain or current complaints. Denies fevers, appetite changes, nausea, vomiting, diarrhea or constipation. Denies any blood in urine or her stool. Reports normal stool color. Patient reports normal activity levels and energy levels.  Patient reports she has had a history of similar in the past without infection, denies known reasons. Patient does reports renal insufficiency. Denies any recent medication changes. Patient reports she was recently seen by her primary care physician in the last few weeks and reports that she was told that her labs were normal. Denies any recent antibiotics. Reports she feels well otherwise.Reports continues to eat and drink well.   Denies chest pain, dizziness, shortness of breath, abdominal pain, back pain, flank pain, lower extremity pain, extremity swelling, rash, headaches. Denies history of diabetes. States last bowel movement yesterday. States occasional coffee, denies drinking much caffeine.   Einar Pheasant, MD PCP   Past Medical History:  Diagnosis Date  . Arthritis   . Breast cancer ([place]) 1995   LT LUMPECTOMY & s/p XRT  . Depression   . Hx of colonic polyp   . Hypercholesterolemia   . Hypertension   . Melanoma ([place])   . Spinal stenosis    lumbar    Patient Active Problem List   Diagnosis Date Noted  . Anxiety 10/20/2015  . Loss of weight 10/20/2015  . Burn 07/14/2015  .  Hyponatremia 08/07/2014  . Health care maintenance 05/05/2014  . Skin cancer 01/29/2014  . Other malaise and fatigue 10/21/2013  . Skin lesion of cheek 10/21/2013  . Renal cyst 03/22/2013  . Chronic kidney disease (CKD), stage III (moderate) 03/14/2013  . Abdominal bruit 03/14/2013  . Spinal stenosis of lumbar region 12/09/2012  . Bulging disc 12/09/2012  . History of colonic polyps 12/09/2012  . History of breast cancer 12/09/2012  . Depression 12/09/2012  . Essential hypertension, benign 12/09/2012  . Hypercholesterolemia 12/09/2012    Past Surgical History:  Procedure Laterality Date  . BREAST BIOPSY Left 2006   EXCISIONAL - NEG  . BREAST BIOPSY Left 1995   LT LUMPECTOMY FOR CA  . BREAST LUMPECTOMY Left 1995  . EYE SURGERY Bilateral    cataract excision   No current facility-administered medications for this encounter.   Current Outpatient Prescriptions:  .  aspirin 81 MG tablet, Take 81 mg by mouth daily., Disp: , Rfl:  .  calcium-vitamin D (OSCAL WITH D) 500-200 MG-UNIT per tablet, Take 2 tablets by mouth daily., Disp: , Rfl:  .  clonazePAM (KLONOPIN) 0.5 MG tablet, TAKE 1 TABLET BY MOUTH EVERY DAY TO TWICE DAILY AS NEEDED., Disp: 135 tablet, Rfl: 0 .  lisinopril-hydrochlorothiazide (PRINZIDE,ZESTORETIC) 20-12.5 MG tablet, TAKE 1 TABLET BY MOUTH EVERY DAY, Disp: 90 tablet, Rfl: 3 .  lovastatin (MEVACOR) 40 MG tablet, TAKE 1-2 TABLETS BY MOUTH DAILY, Disp: 180 tablet, Rfl: 3 .  mirtazapine (REMERON) 30 MG tablet, TAKE 1 TABLET(30 MG) BY MOUTH AT BEDTIME, Disp: 90 tablet, Rfl: 0 .  Multiple Vitamin (MULTIVITAMIN) tablet, Take 1 tablet by mouth  daily., Disp: , Rfl:  .  pregabalin (LYRICA) 50 MG capsule, Take 50 mg by mouth 2 (two) times daily. , Disp: , Rfl:  .  traMADol (ULTRAM) 50 MG tablet, Take 25-50 mg by mouth every 8 (eight) hours as needed for pain., Disp: , Rfl:   Allergies Contrast media [iodinated diagnostic agents]  Family History  Problem Relation Age of  Onset  . Arthritis Mother   . Hyperlipidemia Mother   . Hypertension Mother   . Hyperlipidemia Sister   . Kidney cancer Sister   . Breast cancer Neg Hx     Social History Social History  Substance Use Topics  . Smoking status: Former Smoker    Packs/day: 0.50    Years: 4.00    Types: Cigarettes    Quit date: 12/09/1984  . Smokeless tobacco: Never Used     Comment: quit 12/09/84  . Alcohol use 0.0 oz/week     Comment: sparingly    Review of Systems Constitutional: No fever/chills Eyes: No visual changes. ENT: No sore throat. Cardiovascular: Denies chest pain. Respiratory: Denies shortness of breath. Gastrointestinal: No abdominal pain.  No nausea, no vomiting.  No diarrhea.  No constipation. Genitourinary: Positive for dysuria. Musculoskeletal: Negative for back pain. Skin: Negative for rash. Neurological: Negative for headaches, focal weakness or numbness.  10-point ROS otherwise negative.  ____________________________________________   PHYSICAL EXAM:  VITAL SIGNS: ED Triage Vitals  Enc Vitals Group     BP 11/07/15 0928 (!) 147/63     Pulse Rate 11/07/15 0928 70     Resp 11/07/15 0928 18     Temp 11/07/15 0928 97.9 F (36.6 C)     Temp Source 11/07/15 0928 Oral     SpO2 11/07/15 0928 100 %     Weight 11/07/15 0927 123 lb (55.8 kg)     Height 11/07/15 0927 5\' 2"  (1.575 m)     Head Circumference --      Peak Flow --      Pain Score 11/07/15 0928 0     Pain Loc --      Pain Edu? --      Excl. in [place]? --     Constitutional: Alert and oriented. Well appearing and in no acute distress. Eyes: Conjunctivae are normal. PERRL. EOMI. ENT      Head: Normocephalic and atraumatic.      Mouth/Throat: Mucous membranes are moist. Cardiovascular: Normal rate, regular rhythm. Grossly normal heart sounds.  Good peripheral circulation. Respiratory: Normal respiratory effort without tachypnea nor retractions. Breath sounds are clear and equal bilaterally. No  wheezes/rales/rhonchi. Gastrointestinal: Soft and nontender. No distention. Normal Bowel sounds. No CVA tenderness. Musculoskeletal:  Nontender with normal range of motion in all extremities. No midline cervical, thoracic or lumbar tenderness to palpation. Bilateral pedal pulses equal and easily palpated. No edema noted to bilateral lower extremities. Neurologic:  Normal speech and language. No gross focal neurologic deficits are appreciated. Speech is normal. No gait instability.  Skin:  Skin is warm, dry and intact. No rash noted. Psychiatric: Mood and affect are normal. Speech and behavior are normal. Patient exhibits appropriate insight and judgment   ___________________________________________   LABS (all labs ordered are listed, but only abnormal results are displayed)  Labs Reviewed  URINALYSIS COMPLETEWITH MICROSCOPIC ([place] ONLY) - Abnormal; Notable for the following:       Result Value   Squamous Epithelial / LPF 0-5 (*)    All other components within normal limits  GLUCOSE, CAPILLARY - Abnormal;  Notable for the following:    Glucose-Capillary 115 (*)    All other components within normal limits   ____________________________________________  PROCEDURES Procedures   INITIAL IMPRESSION / ASSESSMENT AND PLAN / ED COURSE  Pertinent labs & imaging results that were available during my care of the patient were reviewed by me and considered in my medical decision making (see chart for details). Discussed patient and plan of care with D  Very well-appearing patient. No acute distress. Presents for the complaints of urinary frequency for one day. Denies pain at this time. Abdomen soft and nontender. Urinalysis reviewed. Clear appearance. No bacteria noted on urinalysis. Patient reports has recently seen by her primary care physician and had blood work evaluated which was normal normal per patient. Denies any medication changes. Denies other complaints at this time. Counsel regarding  close follow-up with primary care physician and monitoring symptoms. Discussed in detail with patient urine is not suggestive of urinary tract infection this time. Blood sugar 115. Encourage close follow-up and monitoring. Counseled regarding strict return and follow-up parameters.  Discussed follow up with Primary care physician this week. Discussed follow up and return parameters including no resolution or any worsening concerns. Patient verbalized understanding and agreed to plan.   ____________________________________________   FINAL CLINICAL IMPRESSION(S) / ED DIAGNOSES  Final diagnoses:  Urinary frequency     Discharge Medication List as of 11/07/2015 10:28 AM      Note: This dictation was prepared with Dragon dictation along with smaller phrase technology. Any transcriptional errors that result from this process are unintentional.    Clinical Course      Marylene Land, NP 11/07/15 [place], NP 11/07/15 1143

## 2015-11-07 NOTE — ED Triage Notes (Signed)
Patient presents with frequent urination.

## 2015-11-07 NOTE — Discharge Instructions (Signed)
Take medication as prescribed.  Follow up with your primary care physician this week for follow up.  Return to Urgent care for new or worsening concerns.

## 2015-11-07 NOTE — Telephone Encounter (Signed)
Please give a time and date to place pt, she was advised by the walkin clinic in [place] to see her PCP in three days following her appt today for urinary issues.  Pt contact [phone]

## 2015-11-07 NOTE — Telephone Encounter (Signed)
Please advise 

## 2015-11-08 NOTE — Telephone Encounter (Signed)
Please add pt to the schedule for 11/13/15 @ noon. Pt is aware of appt. I do not have security to add to schedule for some reason.

## 2015-11-12 DIAGNOSIS — Z872 Personal history of diseases of the skin and subcutaneous tissue: Secondary | ICD-10-CM | POA: Diagnosis not present

## 2015-11-12 DIAGNOSIS — Z1283 Encounter for screening for malignant neoplasm of skin: Secondary | ICD-10-CM | POA: Diagnosis not present

## 2015-11-12 DIAGNOSIS — L570 Actinic keratosis: Secondary | ICD-10-CM | POA: Diagnosis not present

## 2015-11-12 DIAGNOSIS — Z85828 Personal history of other malignant neoplasm of skin: Secondary | ICD-10-CM | POA: Diagnosis not present

## 2015-11-12 DIAGNOSIS — Z08 Encounter for follow-up examination after completed treatment for malignant neoplasm: Secondary | ICD-10-CM | POA: Diagnosis not present

## 2015-11-12 DIAGNOSIS — Z85820 Personal history of malignant melanoma of skin: Secondary | ICD-10-CM | POA: Diagnosis not present

## 2015-11-12 DIAGNOSIS — L821 Other seborrheic keratosis: Secondary | ICD-10-CM | POA: Diagnosis not present

## 2015-11-13 ENCOUNTER — Ambulatory Visit: Payer: PPO | Admitting: Internal Medicine

## 2015-11-18 ENCOUNTER — Encounter: Payer: Self-pay | Admitting: Family

## 2015-11-18 ENCOUNTER — Ambulatory Visit (INDEPENDENT_AMBULATORY_CARE_PROVIDER_SITE_OTHER): Payer: PPO | Admitting: Family

## 2015-11-18 VITALS — BP 150/78 | HR 86 | Temp 97.8°F | Ht 62.0 in | Wt 129.4 lb

## 2015-11-18 DIAGNOSIS — R5383 Other fatigue: Secondary | ICD-10-CM | POA: Diagnosis not present

## 2015-11-18 DIAGNOSIS — R300 Dysuria: Secondary | ICD-10-CM | POA: Diagnosis not present

## 2015-11-18 DIAGNOSIS — Z23 Encounter for immunization: Secondary | ICD-10-CM | POA: Diagnosis not present

## 2015-11-18 LAB — POCT URINALYSIS DIPSTICK
Bilirubin, UA: NEGATIVE
Blood, UA: NEGATIVE
GLUCOSE UA: NEGATIVE
Ketones, UA: NEGATIVE
LEUKOCYTES UA: NEGATIVE
NITRITE UA: NEGATIVE
PROTEIN UA: NEGATIVE
UROBILINOGEN UA: 0.2
pH, UA: 5

## 2015-11-18 NOTE — Addendum Note (Signed)
Addended by: Leeanne Rio on: 11/18/2015 04:18 PM   Modules accepted: Orders

## 2015-11-18 NOTE — Progress Notes (Signed)
Pre visit review using our clinic review tool, if applicable. No additional management support is needed unless otherwise documented below in the visit note. 

## 2015-11-18 NOTE — Progress Notes (Signed)
Subjective:    [name], female    DOB: 08-25-1930, 82 y.o.   MRN: [id number]  CC: Diana Lester is a 82 y.o. female who presents today for an acute visit.    HPI: Patient here for acute visit with complaint of urinary frequency during day for 2 days. No fever, chills, hematuria, back pain. No urgency or incontinence. No DM. Has been ACE I-HCTZ for many years. Not drinking more water than usual. One cup of coffee every morning. Leaving for the beach this weekend and wanted to be seen to be sure no infection.   Seen at Urgent care early September for urinary frequency. Was not diagnosed with UTI. Frequency resolved. Unsure if urinary culture/     HISTORY:  Past Medical History:  Diagnosis Date  . Arthritis   . Breast cancer ([place]) 1995   LT LUMPECTOMY & s/p XRT  . Depression   . Hx of colonic polyp   . Hypercholesterolemia   . Hypertension   . Melanoma ([place])   . Spinal stenosis    lumbar   Past Surgical History:  Procedure Laterality Date  . BREAST BIOPSY Left 2006   EXCISIONAL - NEG  . BREAST BIOPSY Left 1995   LT LUMPECTOMY FOR CA  . BREAST LUMPECTOMY Left 1995  . EYE SURGERY Bilateral    cataract excision   Family History  Problem Relation Age of Onset  . Arthritis Mother   . Hyperlipidemia Mother   . Hypertension Mother   . Hyperlipidemia Sister   . Kidney cancer Sister   . Breast cancer Neg Hx     Allergies: Contrast media [iodinated diagnostic agents] Current Outpatient Prescriptions on File Prior to Visit  Medication Sig Dispense Refill  . aspirin 81 MG tablet Take 81 mg by mouth daily.    . calcium-vitamin D (OSCAL WITH D) 500-200 MG-UNIT per tablet Take 2 tablets by mouth daily.    . clonazePAM (KLONOPIN) 0.5 MG tablet TAKE 1 TABLET BY MOUTH EVERY DAY TO TWICE DAILY AS NEEDED. 135 tablet 0  . lisinopril-hydrochlorothiazide (PRINZIDE,ZESTORETIC) 20-12.5 MG tablet TAKE 1 TABLET BY MOUTH EVERY DAY 90 tablet 3  . lovastatin (MEVACOR) 40  MG tablet TAKE 1-2 TABLETS BY MOUTH DAILY 180 tablet 3  . mirtazapine (REMERON) 30 MG tablet TAKE 1 TABLET(30 MG) BY MOUTH AT BEDTIME 90 tablet 0  . Multiple Vitamin (MULTIVITAMIN) tablet Take 1 tablet by mouth daily.    . pregabalin (LYRICA) 50 MG capsule Take 50 mg by mouth 2 (two) times daily.     . traMADol (ULTRAM) 50 MG tablet Take 25-50 mg by mouth every 8 (eight) hours as needed for pain.     No current facility-administered medications on file prior to visit.     Social History  Substance Use Topics  . Smoking status: Former Smoker    Packs/day: 0.50    Years: 4.00    Types: Cigarettes    Quit date: 12/09/1984  . Smokeless tobacco: Never Used     Comment: quit 12/09/84  . Alcohol use 0.0 oz/week     Comment: sparingly    Review of Systems  Constitutional: Negative for chills and fever.  Respiratory: Negative for cough.   Cardiovascular: Negative for chest pain and palpitations.  Gastrointestinal: Negative for nausea and vomiting.  Genitourinary: Positive for frequency. Negative for decreased urine volume, dysuria, flank pain, hematuria and urgency.      Objective:    BP (!) 150/78  Pulse 86   Temp 97.8 F (36.6 C) (Oral)   Ht 5\' 2"  (1.575 m)   Wt 129 lb 6.4 oz (58.7 kg)   SpO2 97%   BMI 23.67 kg/m    Physical Exam  Constitutional: She appears well-developed and well-nourished.  Cardiovascular: Normal rate, regular rhythm, normal heart sounds and normal pulses.   Pulmonary/Chest: Effort normal and breath sounds normal. She has no wheezes. She has no rhonchi. She has no rales.  Abdominal: There is no CVA tenderness.  Neurological: She is alert.  Skin: Skin is warm and dry.  Psychiatric: She has a normal mood and affect. Her speech is normal and behavior is normal. Thought content normal.  Vitals reviewed.      Assessment & Plan:   1. Dysuria UA is negative for blood, nitrites, leukocytes. Urinary frequency is nonspecific at this time. Patient has no  history of diabetes and has been on diuretic for many years. I'm reassured that when symptoms presented early September it resolved on its own and only over the past couple days that she has noticed urinary frequency again. Patient has no urinary urgency, incontinence, or nocturia. Pending basic laboratory workup and urinary culture. Advised patient that if no obvious etiology of urinary frequency, to come back for another office visit for further evaluation.  - POCT Urinalysis Dipstick - CULTURE, URINE COMPREHENSIVE  2. Encounter for immunization  - Flu vaccine HIGH DOSE PF    I am having Ms. Roseman maintain her aspirin, multivitamin, calcium-vitamin D, traMADol, pregabalin, lisinopril-hydrochlorothiazide, lovastatin, clonazePAM, and mirtazapine.   No orders of the defined types were placed in this encounter.   Return precautions given.   Risks, benefits, and alternatives of the medications and treatment plan prescribed today were discussed, and patient expressed understanding.   Education regarding symptom management and diagnosis given to patient on AVS.  Continue to follow with Einar Pheasant, MD for routine health maintenance.   Youlanda Mighty and I agreed with plan.   Mable Paris, FNP

## 2015-11-18 NOTE — Patient Instructions (Addendum)
As we discussed, lets wait on urine culture prior to starting antibiotics. If unrevealing, I would like for you to see Dr Nicki Reaper for further evaluation.

## 2015-11-20 LAB — CULTURE, URINE COMPREHENSIVE

## 2015-12-01 ENCOUNTER — Ambulatory Visit
Admission: EM | Admit: 2015-12-01 | Discharge: 2015-12-01 | Disposition: A | Discharge status: Home / Self Care | Payer: PPO | Attending: Family Medicine | Admitting: Family Medicine

## 2015-12-01 ENCOUNTER — Encounter: Payer: Self-pay | Admitting: *Deleted

## 2015-12-01 DIAGNOSIS — H6123 Impacted cerumen, bilateral: Secondary | ICD-10-CM | POA: Diagnosis not present

## 2015-12-01 NOTE — ED Triage Notes (Signed)
Patient has arrived with complaint of ear wax impaction bilateral ear canals.

## 2015-12-01 NOTE — ED Provider Notes (Signed)
[facility]    CSN: [id number] Arrival date & time: 12/01/15  1005  First Provider Contact:  First MD Initiated Contact with Patient 12/01/15 1126        History   Chief Complaint Chief Complaint  Patient presents with  . Cerumen Impaction    HPI Diana Lester is a 82 y.o. female.   Patient with excess wax in both ears. She states she gets excess wax in ears about once a year and has abnormal. She denies any other medical problems. His past on his breast cancer lumpectomy depression history colonic polyp hyperlipidemia hypertension melanoma of spinal stenosis she's had a history of skin cancer and other lesions as well   The history is provided by the patient. No language interpreter was used.  Otalgia  Location:  Bilateral Behind ear:  No abnormality Quality:  Pressure Severity:  Moderate Onset quality:  Sudden Progression:  Worsening Chronicity:  Recurrent Context: foreign body   Relieved by:  Nothing Worsened by:  Nothing Ineffective treatments:  OTC medications Associated symptoms: no abdominal pain, no congestion, no cough, no fever, no headaches, no hearing loss, no neck pain, no rhinorrhea and no tinnitus     Past Medical History:  Diagnosis Date  . Arthritis   . Breast cancer ([place]) 1995   LT LUMPECTOMY & s/p XRT  . Depression   . Hx of colonic polyp   . Hypercholesterolemia   . Hypertension   . Melanoma ([place])   . Spinal stenosis    lumbar    Patient Active Problem List   Diagnosis Date Noted  . Anxiety 10/20/2015  . Loss of weight 10/20/2015  . Burn 07/14/2015  . Hyponatremia 08/07/2014  . Health care maintenance 05/05/2014  . Skin cancer 01/29/2014  . Other malaise and fatigue 10/21/2013  . Skin lesion of cheek 10/21/2013  . Renal cyst 03/22/2013  . Chronic kidney disease (CKD), stage III (moderate) 03/14/2013  . Abdominal bruit 03/14/2013  . Spinal stenosis of lumbar region 12/09/2012  . Bulging disc 12/09/2012  . History of  colonic polyps 12/09/2012  . History of breast cancer 12/09/2012  . Depression 12/09/2012  . Essential hypertension, benign 12/09/2012  . Hypercholesterolemia 12/09/2012    Past Surgical History:  Procedure Laterality Date  . BREAST BIOPSY Left 2006   EXCISIONAL - NEG  . BREAST BIOPSY Left 1995   LT LUMPECTOMY FOR CA  . BREAST LUMPECTOMY Left 1995  . EYE SURGERY Bilateral    cataract excision    OB History    No data available       Home Medications    Prior to Admission medications   Medication Sig Start Date End Date Taking? Authorizing Provider  aspirin 81 MG tablet Take 81 mg by mouth daily.   Yes Historical Provider, MD  clonazePAM (KLONOPIN) 0.5 MG tablet TAKE 1 TABLET BY MOUTH EVERY DAY TO TWICE DAILY AS NEEDED. 08/19/15  Yes Einar Pheasant, MD  lisinopril-hydrochlorothiazide (PRINZIDE,ZESTORETIC) 20-12.5 MG tablet TAKE 1 TABLET BY MOUTH EVERY DAY 05/11/15  Yes Einar Pheasant, MD  lovastatin (MEVACOR) 40 MG tablet TAKE 1-2 TABLETS BY MOUTH DAILY 07/12/15  Yes Einar Pheasant, MD  mirtazapine (REMERON) 30 MG tablet TAKE 1 TABLET(30 MG) BY MOUTH AT BEDTIME 10/27/15  Yes Einar Pheasant, MD  Multiple Vitamin (MULTIVITAMIN) tablet Take 1 tablet by mouth daily.   Yes Historical Provider, MD  pregabalin (LYRICA) 50 MG capsule Take 50 mg by mouth 2 (two) times daily.    Yes  Historical Provider, MD  traMADol (ULTRAM) 50 MG tablet Take 25-50 mg by mouth every 8 (eight) hours as needed for pain.   Yes Historical Provider, MD  calcium-vitamin D (OSCAL WITH D) 500-200 MG-UNIT per tablet Take 2 tablets by mouth daily.    Historical Provider, MD    Family History Family History  Problem Relation Age of Onset  . Arthritis Mother   . Hyperlipidemia Mother   . Hypertension Mother   . Hyperlipidemia Sister   . Kidney cancer Sister   . Breast cancer Neg Hx     Social History Social History  Substance Use Topics  . Smoking status: Former Smoker    Packs/day: 0.50    Years: 4.00     Types: Cigarettes    Quit date: 12/09/1984  . Smokeless tobacco: Never Used     Comment: quit 12/09/84  . Alcohol use 0.0 oz/week     Comment: sparingly     Allergies   Contrast media [iodinated diagnostic agents]   Review of Systems Review of Systems  Constitutional: Negative for fever.  HENT: Positive for ear pain. Negative for congestion, hearing loss, rhinorrhea and tinnitus.   Respiratory: Negative for cough.   Gastrointestinal: Negative for abdominal pain.  Musculoskeletal: Negative for neck pain.  Neurological: Negative for headaches.  All other systems reviewed and are negative.    Physical Exam Triage Vital Signs ED Triage Vitals  Enc Vitals Group     BP 12/01/15 1037 (!) 134/59     Pulse Rate 12/01/15 1037 84     Resp 12/01/15 1037 16     Temp 12/01/15 1037 98.4 F (36.9 C)     Temp Source 12/01/15 1037 Oral     SpO2 12/01/15 1037 99 %     Weight 12/01/15 1038 129 lb (58.5 kg)     Height 12/01/15 1038 5\' 2"  (1.575 m)     Head Circumference --      Peak Flow --      Pain Score 12/01/15 1041 0     Pain Loc --      Pain Edu? --      Excl. in [place]? --    No data found.   Updated Vital Signs BP (!) 134/59 (BP Location: Right Arm)   Pulse 84   Temp 98.4 F (36.9 C) (Oral)   Resp 16   Ht 5\' 2"  (1.575 m)   Wt 129 lb (58.5 kg)   SpO2 99%   BMI 23.59 kg/m   Visual Acuity Right Eye Distance:   Left Eye Distance:   Bilateral Distance:    Right Eye Near:   Left Eye Near:    Bilateral Near:     Physical Exam  Constitutional: She is oriented to person, place, and time. She appears well-developed and well-nourished.  HENT:  Head: Normocephalic and atraumatic.  Right Ear: Tympanic membrane normal. A foreign body is present. Decreased hearing is noted.  Left Ear: Tympanic membrane and external ear normal. A foreign body is present. Decreased hearing is noted.  Eyes: Pupils are equal, round, and reactive to light.  Neck: Normal range of motion.    Pulmonary/Chest: Effort normal.  Musculoskeletal: Normal range of motion.  Neurological: She is alert and oriented to person, place, and time.  Skin: Skin is warm.  Psychiatric: She has a normal mood and affect.  Vitals reviewed.    UC Treatments / Results  Labs (all labs ordered are listed, but only abnormal results are displayed) Labs  Reviewed - No data to display  EKG  EKG Interpretation None       Radiology No results found.  Procedures .Ear Cerumen Removal Date/Time: 12/01/2015 11:31 AM Performed by: Frederich Cha Authorized by: Frederich Cha   Consent:    Consent obtained:  Verbal   Consent given by:  Patient Procedure details:    Location: Both ears.   Procedure type comment:  Both ears were irrigated and then followed by Dr. Alveta Heimlich using correct to remove the rest the wax was in present in both ear canals Post-procedure details:    Inspection:  TM intact   Hearing quality:  Improved   Patient tolerance of procedure:  Tolerated well, no immediate complications   (including critical care time)  Medications Ordered in UC Medications - No data to display   Initial Impression / Assessment and Plan / UC Course  I have reviewed the triage vital signs and the nursing notes.  Pertinent labs & imaging results that were available during my care of the patient were reviewed by me and considered in my medical decision making (see chart for details).  Clinical Course   Patient had been both ears irrigated with removal of large amount of wax. Along with large mobile wax patient had improvement of hearing. But Still has some wax present both ears using a dull correct by Dr. Ardith Dark the wax is removed so we decided 95% wax was removed both ears. Patient was much improved.   Final Clinical Impressions(s) / UC Diagnoses   Final diagnoses:  Excessive cerumen in both ear canals    New Prescriptions Discharge Medication List as of 12/01/2015 11:28 AM       Frederich Cha, MD 12/01/15 1137

## 2015-12-11 ENCOUNTER — Ambulatory Visit
Admission: RE | Admit: 2015-12-11 | Discharge: 2015-12-11 | Disposition: A | Discharge status: Home / Self Care | Payer: PPO | Source: Ambulatory Visit | Attending: Internal Medicine | Admitting: Internal Medicine

## 2015-12-11 DIAGNOSIS — Z1231 Encounter for screening mammogram for malignant neoplasm of breast: Secondary | ICD-10-CM | POA: Diagnosis not present

## 2015-12-12 ENCOUNTER — Other Ambulatory Visit: Payer: Self-pay | Admitting: Internal Medicine

## 2015-12-12 DIAGNOSIS — N6489 Other specified disorders of breast: Secondary | ICD-10-CM

## 2015-12-12 DIAGNOSIS — R928 Other abnormal and inconclusive findings on diagnostic imaging of breast: Secondary | ICD-10-CM

## 2015-12-12 NOTE — Progress Notes (Signed)
Order placed for f/u right breast mammogram and ultrasound  

## 2015-12-15 ENCOUNTER — Other Ambulatory Visit (INDEPENDENT_AMBULATORY_CARE_PROVIDER_SITE_OTHER): Payer: PPO

## 2015-12-15 DIAGNOSIS — R300 Dysuria: Secondary | ICD-10-CM

## 2015-12-15 DIAGNOSIS — R5383 Other fatigue: Secondary | ICD-10-CM | POA: Diagnosis not present

## 2015-12-15 DIAGNOSIS — E7800 Pure hypercholesterolemia, unspecified: Secondary | ICD-10-CM | POA: Diagnosis not present

## 2015-12-15 LAB — LIPID PANEL
CHOLESTEROL: 222 mg/dL — AB (ref 0–200)
HDL: 63.2 mg/dL (ref >39.00)
LDL CALC: 120 mg/dL — AB (ref 0–99)
NONHDL: 159.22
Total CHOL/HDL Ratio: 4
Triglycerides: 194 mg/dL — ABNORMAL HIGH (ref 0.0–149.0)
VLDL: 38.8 mg/dL (ref 0.0–40.0)

## 2015-12-15 LAB — CBC WITH DIFFERENTIAL/PLATELET
BASOS PCT: 0.5 % (ref 0.0–3.0)
Basophils Absolute: 0 10*3/uL (ref 0.0–0.1)
EOS ABS: 0.2 10*3/uL (ref 0.0–0.7)
Eosinophils Relative: 2.6 % (ref 0.0–5.0)
HCT: 46.3 % — ABNORMAL HIGH (ref 36.0–46.0)
Hemoglobin: 15.5 g/dL — ABNORMAL HIGH (ref 12.0–15.0)
LYMPHS ABS: 1.3 10*3/uL (ref 0.7–4.0)
Lymphocytes Relative: 15 % (ref 12.0–46.0)
MCHC: 33.5 g/dL (ref 30.0–36.0)
MCV: 87.5 fl (ref 78.0–100.0)
MONO ABS: 0.5 10*3/uL (ref 0.1–1.0)
Monocytes Relative: 5.9 % (ref 3.0–12.0)
NEUTROS ABS: 6.5 10*3/uL (ref 1.4–7.7)
Neutrophils Relative %: 76 % (ref 43.0–77.0)
PLATELETS: 283 10*3/uL (ref 150.0–400.0)
RBC: 5.29 Mil/uL — ABNORMAL HIGH (ref 3.87–5.11)
RDW: 13.8 % (ref 11.5–15.5)
WBC: 8.6 10*3/uL (ref 4.0–10.5)

## 2015-12-15 LAB — COMPREHENSIVE METABOLIC PANEL
ALT: 11 U/L (ref 0–35)
AST: 18 U/L (ref 0–37)
Albumin: 3.9 g/dL (ref 3.5–5.2)
Alkaline Phosphatase: 62 U/L (ref 39–117)
BUN: 23 mg/dL (ref 6–23)
CHLORIDE: 102 meq/L (ref 96–112)
CO2: 29 mEq/L (ref 19–32)
Calcium: 9.8 mg/dL (ref 8.4–10.5)
Creatinine, Ser: 1.43 mg/dL — ABNORMAL HIGH (ref 0.40–1.20)
GFR: 37.26 mL/min — ABNORMAL LOW (ref >60.00)
GLUCOSE: 114 mg/dL — AB (ref 70–99)
POTASSIUM: 3.9 meq/L (ref 3.5–5.1)
SODIUM: 139 meq/L (ref 135–145)
Total Bilirubin: 0.5 mg/dL (ref 0.2–1.2)
Total Protein: 6.5 g/dL (ref 6.0–8.3)

## 2015-12-15 LAB — HEPATIC FUNCTION PANEL
ALT: 11 U/L (ref 0–35)
AST: 18 U/L (ref 0–37)
Albumin: 3.9 g/dL (ref 3.5–5.2)
Alkaline Phosphatase: 62 U/L (ref 39–117)
BILIRUBIN TOTAL: 0.5 mg/dL (ref 0.2–1.2)
Bilirubin, Direct: 0 mg/dL (ref 0.0–0.3)
Total Protein: 6.5 g/dL (ref 6.0–8.3)

## 2015-12-15 LAB — TSH: TSH: 2.85 u[IU]/mL (ref 0.35–4.50)

## 2015-12-18 ENCOUNTER — Ambulatory Visit
Admission: RE | Admit: 2015-12-18 | Discharge: 2015-12-18 | Disposition: A | Discharge status: Home / Self Care | Payer: PPO | Source: Ambulatory Visit | Attending: Internal Medicine | Admitting: Internal Medicine

## 2015-12-18 DIAGNOSIS — R928 Other abnormal and inconclusive findings on diagnostic imaging of breast: Secondary | ICD-10-CM | POA: Insufficient documentation

## 2015-12-22 ENCOUNTER — Encounter (INDEPENDENT_AMBULATORY_CARE_PROVIDER_SITE_OTHER): Payer: Self-pay

## 2015-12-22 ENCOUNTER — Ambulatory Visit (INDEPENDENT_AMBULATORY_CARE_PROVIDER_SITE_OTHER): Payer: PPO | Admitting: Internal Medicine

## 2015-12-22 ENCOUNTER — Encounter: Payer: Self-pay | Admitting: Internal Medicine

## 2015-12-22 VITALS — BP 170/92 | HR 104 | Temp 98.7°F | Ht 62.0 in | Wt 129.0 lb

## 2015-12-22 DIAGNOSIS — I10 Essential (primary) hypertension: Secondary | ICD-10-CM

## 2015-12-22 DIAGNOSIS — E7800 Pure hypercholesterolemia, unspecified: Secondary | ICD-10-CM

## 2015-12-22 DIAGNOSIS — N183 Chronic kidney disease, stage 3 unspecified: Secondary | ICD-10-CM

## 2015-12-22 DIAGNOSIS — Z853 Personal history of malignant neoplasm of breast: Secondary | ICD-10-CM | POA: Diagnosis not present

## 2015-12-22 DIAGNOSIS — Z23 Encounter for immunization: Secondary | ICD-10-CM | POA: Diagnosis not present

## 2015-12-22 DIAGNOSIS — N281 Cyst of kidney, acquired: Secondary | ICD-10-CM | POA: Diagnosis not present

## 2015-12-22 DIAGNOSIS — F419 Anxiety disorder, unspecified: Secondary | ICD-10-CM

## 2015-12-22 MED ORDER — CLONAZEPAM 0.5 MG PO TABS: ORAL_TABLET | ORAL | 0 refills | Status: DC

## 2015-12-22 NOTE — Progress Notes (Signed)
Patient ID: CONSTANTINA LASETER, female   DOB: Jan 11, 1931, 82 y.o.   MRN: [id number]   Subjective:    Patient ID: Youlanda Mighty, female    DOB: 1930-11-29, 82 y.o.   MRN: [id number]  HPI  Patient here for a scheduled follow up.   She has been having some issues with increased anxiety.  Discussed with her today.  Have adjusted her medication.  Discussed her current treatment regimen.  Discussed my concern of the persistent increased anxiety.  Had tried to get her set up with psychiatry previously.  She declined.  Finally agreed today.  Some dry mouth.  Discussed staying hydrated.  No chest pain.  Tries to stay active.  No sob.  No acid reflux.  No abdominal pain or cramping.  Bowels stable.     Past Medical History:  Diagnosis Date  . Arthritis   . Breast cancer ([place]) 1995   LT LUMPECTOMY & s/p XRT  . Depression   . Hx of colonic polyp   . Hypercholesterolemia   . Hypertension   . Melanoma ([place])   . Spinal stenosis    lumbar   Past Surgical History:  Procedure Laterality Date  . BREAST BIOPSY Left 2006   EXCISIONAL - NEG  . BREAST BIOPSY Left 1995   LT LUMPECTOMY FOR CA  . BREAST LUMPECTOMY Left 1995  . EYE SURGERY Bilateral    cataract excision   Family History  Problem Relation Age of Onset  . Arthritis Mother   . Hyperlipidemia Mother   . Hypertension Mother   . Hyperlipidemia Sister   . Kidney cancer Sister   . Breast cancer Neg Hx    Social History   Social History  . Marital status: Widowed    Spouse name: N/A  . Number of children: 2  . Years of education: N/A   Social History Main Topics  . Smoking status: Former Smoker    Packs/day: 0.50    Years: 4.00    Types: Cigarettes    Quit date: 12/09/1984  . Smokeless tobacco: Never Used     Comment: quit 12/09/84  . Alcohol use 0.0 oz/week     Comment: sparingly  . Drug use: No  . Sexual activity: No   Other Topics Concern  . None   Social History Narrative  . None    Outpatient Encounter  Prescriptions as of 12/22/2015  Medication Sig  . aspirin 81 MG tablet Take 81 mg by mouth daily.  . calcium-vitamin D (OSCAL WITH D) 500-200 MG-UNIT per tablet Take 2 tablets by mouth daily.  . clonazePAM (KLONOPIN) 0.5 MG tablet TAKE 1 TABLET BY MOUTH EVERY DAY TO TWICE DAILY AS NEEDED.  Marland Kitchen lisinopril-hydrochlorothiazide (PRINZIDE,ZESTORETIC) 20-12.5 MG tablet TAKE 1 TABLET BY MOUTH EVERY DAY  . lovastatin (MEVACOR) 40 MG tablet TAKE 1-2 TABLETS BY MOUTH DAILY  . mirtazapine (REMERON) 30 MG tablet TAKE 1 TABLET(30 MG) BY MOUTH AT BEDTIME  . pregabalin (LYRICA) 50 MG capsule Take 50 mg by mouth daily.   . traMADol (ULTRAM) 50 MG tablet Take 25-50 mg by mouth every 8 (eight) hours as needed for pain.  . [DISCONTINUED] clonazePAM (KLONOPIN) 0.5 MG tablet TAKE 1 TABLET BY MOUTH EVERY DAY TO TWICE DAILY AS NEEDED.  . [DISCONTINUED] clonazePAM (KLONOPIN) 0.5 MG tablet TAKE 1 TABLET BY MOUTH EVERY DAY TO TWICE DAILY AS NEEDED.  . [DISCONTINUED] Multiple Vitamin (MULTIVITAMIN) tablet Take 1 tablet by mouth daily.   No facility-administered encounter medications on file as  of 12/22/2015.     Review of Systems  Constitutional: Negative for appetite change and unexpected weight change.  HENT: Negative for congestion and sinus pressure.   Respiratory: Negative for cough, chest tightness and shortness of breath.   Cardiovascular: Negative for chest pain, palpitations and leg swelling.  Gastrointestinal: Negative for abdominal pain, diarrhea, nausea and vomiting.  Genitourinary: Negative for difficulty urinating and dysuria.  Musculoskeletal: Negative for back pain and joint swelling.  Skin: Negative for color change and rash.  Neurological: Negative for dizziness, light-headedness and headaches.  Psychiatric/Behavioral: Negative for agitation and dysphoric mood.       Objective:     Blood pressure rechecked by me:  136-138/82  Physical Exam  Constitutional: She appears well-developed and  well-nourished. No distress.  HENT:  Nose: Nose normal.  Mouth/Throat: Oropharynx is clear and moist.  Neck: Neck supple. No thyromegaly present.  Cardiovascular: Normal rate and regular rhythm.   Pulmonary/Chest: Breath sounds normal. No respiratory distress. She has no wheezes.  Abdominal: Soft. Bowel sounds are normal. There is no tenderness.  Musculoskeletal: She exhibits no edema or tenderness.  Lymphadenopathy:    She has no cervical adenopathy.  Skin: No rash noted. No erythema.  Psychiatric: She has a normal mood and affect. Her behavior is normal.    BP (!) 170/92   Pulse (!) 104   Temp 98.7 F (37.1 C) (Oral)   Ht 5\' 2"  (1.575 m)   Wt 129 lb (58.5 kg)   SpO2 95%   BMI 23.59 kg/m  Wt Readings from Last 3 Encounters:  12/22/15 129 lb (58.5 kg)  12/01/15 129 lb (58.5 kg)  11/18/15 129 lb 6.4 oz (58.7 kg)     Lab Results  Component Value Date   WBC 8.6 12/15/2015   HGB 15.5 (H) 12/15/2015   HCT 46.3 (H) 12/15/2015   PLT 283.0 12/15/2015   GLUCOSE 114 (H) 12/15/2015   CHOL 222 (H) 12/15/2015   TRIG 194.0 (H) 12/15/2015   HDL 63.20 12/15/2015   LDLDIRECT 126.7 04/17/2013   LDLCALC 120 (H) 12/15/2015   ALT 11 12/15/2015   ALT 11 12/15/2015   AST 18 12/15/2015   AST 18 12/15/2015   NA 139 12/15/2015   K 3.9 12/15/2015   CL 102 12/15/2015   CREATININE 1.43 (H) 12/15/2015   BUN 23 12/15/2015   CO2 29 12/15/2015   TSH 2.85 12/15/2015    Mm Diag Breast Tomo Uni Right  Result Date: 12/18/2015 CLINICAL DATA:  Callback from screening mammogram for a possible asymmetry right breast EXAM: 2D DIGITAL DIAGNOSTIC UNILATERAL RIGHT MAMMOGRAM WITH CAD AND ADJUNCT TOMO COMPARISON:  Previous exam(s). ACR Breast Density Category b: There are scattered areas of fibroglandular density. FINDINGS: Cc and MLO views of the right breast are submitted. Previously questioned asymmetry is due to superimposed loop of blood vessel. No suspicious abnormalities identified in the right  breast. Mammographic images were processed with CAD. IMPRESSION: Negative. RECOMMENDATION: Routine screening mammogram back on schedule. I have discussed the findings and recommendations with the patient. Results were also provided in writing at the conclusion of the visit. If applicable, a reminder letter will be sent to the patient regarding the next appointment. BI-RADS CATEGORY  1: Negative. Electronically Signed   By: Abelardo Diesel M.D.   On: 12/18/2015 16:03       Assessment & Plan:   Problem List Items Addressed This Visit    Anxiety    Discussed at length with her today.  Discussed  her current medications.  Discussed referral to psychiatry.  She finally agrees to referral.  Follow.        Chronic kidney disease (CKD), stage III (moderate)    Has been seen by [facility] nephrology.  Creatinine stable 1.3-1.4.        Essential hypertension, benign    Blood pressure elevated initially.  Recheck improved.  Follow pressures.  Follow metabolic panel.       History of breast cancer    Mammogram 12/18/15 - Birads I.       Hypercholesterolemia    On lovastatin.  LDL 120 on recent check.  Follow lipid panel.  States taking her medication regularly.        Renal cyst    Continue f/u with nephrology.        Other Visit Diagnoses    Need for vaccination    -  Primary   Relevant Orders   Pneumococcal conjugate vaccine 13-valent (Completed)       Einar Pheasant, MD

## 2015-12-22 NOTE — Assessment & Plan Note (Signed)
Has been seen by [facility] nephrology.  Creatinine stable 1.3-1.4.

## 2015-12-22 NOTE — Progress Notes (Signed)
Pre visit review using our clinic review tool, if applicable. No additional management support is needed unless otherwise documented below in the visit note. 

## 2015-12-23 ENCOUNTER — Telehealth: Payer: Self-pay | Admitting: Internal Medicine

## 2015-12-23 NOTE — Telephone Encounter (Signed)
The patient was seen in the office on yesterday . She stated that she spoke with the physician about a psychiatric referral ,but now she does not want to see a psychiatrist. Per the patient she is getting herself together.

## 2015-12-23 NOTE — Telephone Encounter (Signed)
I do not see where the referral was placed. So just has a FYI.

## 2015-12-28 ENCOUNTER — Encounter: Payer: Self-pay | Admitting: Internal Medicine

## 2015-12-28 NOTE — Assessment & Plan Note (Signed)
Continue f/u with nephrology

## 2015-12-28 NOTE — Assessment & Plan Note (Signed)
On lovastatin.  LDL 120 on recent check.  Follow lipid panel.  States taking her medication regularly.

## 2015-12-28 NOTE — Assessment & Plan Note (Signed)
Blood pressure elevated initially.  Recheck improved.  Follow pressures.  Follow metabolic panel.

## 2015-12-28 NOTE — Assessment & Plan Note (Signed)
Discussed at length with her today.  Discussed her current medications.  Discussed referral to psychiatry.  She finally agrees to referral.  Follow.

## 2015-12-28 NOTE — Assessment & Plan Note (Signed)
Mammogram 12/18/15 - Birads I.

## 2016-01-09 DIAGNOSIS — R109 Unspecified abdominal pain: Secondary | ICD-10-CM | POA: Diagnosis not present

## 2016-01-10 ENCOUNTER — Encounter: Payer: Self-pay | Admitting: Emergency Medicine

## 2016-01-10 ENCOUNTER — Inpatient Hospital Stay
Admission: EM | Admit: 2016-01-10 | Discharge: 2016-01-18 | DRG: 339 | Disposition: A | Discharge status: Home Health | Payer: PPO | Attending: General Surgery | Admitting: General Surgery

## 2016-01-10 ENCOUNTER — Emergency Department: Payer: PPO

## 2016-01-10 DIAGNOSIS — K352 Acute appendicitis with generalized peritonitis: Principal | ICD-10-CM | POA: Diagnosis present

## 2016-01-10 DIAGNOSIS — Z8342 Family history of familial hypercholesterolemia: Secondary | ICD-10-CM | POA: Diagnosis not present

## 2016-01-10 DIAGNOSIS — N183 Chronic kidney disease, stage 3 (moderate): Secondary | ICD-10-CM | POA: Diagnosis not present

## 2016-01-10 DIAGNOSIS — K5790 Diverticulosis of intestine, part unspecified, without perforation or abscess without bleeding: Secondary | ICD-10-CM | POA: Diagnosis not present

## 2016-01-10 DIAGNOSIS — Z79891 Long term (current) use of opiate analgesic: Secondary | ICD-10-CM | POA: Diagnosis not present

## 2016-01-10 DIAGNOSIS — E871 Hypo-osmolality and hyponatremia: Secondary | ICD-10-CM | POA: Diagnosis present

## 2016-01-10 DIAGNOSIS — Z853 Personal history of malignant neoplasm of breast: Secondary | ICD-10-CM | POA: Diagnosis not present

## 2016-01-10 DIAGNOSIS — Z79899 Other long term (current) drug therapy: Secondary | ICD-10-CM | POA: Diagnosis not present

## 2016-01-10 DIAGNOSIS — Z7982 Long term (current) use of aspirin: Secondary | ICD-10-CM

## 2016-01-10 DIAGNOSIS — R1011 Right upper quadrant pain: Secondary | ICD-10-CM | POA: Diagnosis not present

## 2016-01-10 DIAGNOSIS — Z87891 Personal history of nicotine dependence: Secondary | ICD-10-CM

## 2016-01-10 DIAGNOSIS — I129 Hypertensive chronic kidney disease with stage 1 through stage 4 chronic kidney disease, or unspecified chronic kidney disease: Secondary | ICD-10-CM | POA: Diagnosis not present

## 2016-01-10 DIAGNOSIS — F329 Major depressive disorder, single episode, unspecified: Secondary | ICD-10-CM | POA: Diagnosis present

## 2016-01-10 DIAGNOSIS — Z91041 Radiographic dye allergy status: Secondary | ICD-10-CM

## 2016-01-10 DIAGNOSIS — Z8261 Family history of arthritis: Secondary | ICD-10-CM | POA: Diagnosis not present

## 2016-01-10 DIAGNOSIS — Z9842 Cataract extraction status, left eye: Secondary | ICD-10-CM | POA: Diagnosis not present

## 2016-01-10 DIAGNOSIS — R1031 Right lower quadrant pain: Secondary | ICD-10-CM | POA: Diagnosis not present

## 2016-01-10 DIAGNOSIS — Z86010 Personal history of colonic polyps: Secondary | ICD-10-CM | POA: Diagnosis not present

## 2016-01-10 DIAGNOSIS — R918 Other nonspecific abnormal finding of lung field: Secondary | ICD-10-CM | POA: Diagnosis not present

## 2016-01-10 DIAGNOSIS — F419 Anxiety disorder, unspecified: Secondary | ICD-10-CM | POA: Diagnosis not present

## 2016-01-10 DIAGNOSIS — Z85820 Personal history of malignant melanoma of skin: Secondary | ICD-10-CM | POA: Diagnosis not present

## 2016-01-10 DIAGNOSIS — M6281 Muscle weakness (generalized): Secondary | ICD-10-CM

## 2016-01-10 DIAGNOSIS — M48061 Spinal stenosis, lumbar region without neurogenic claudication: Secondary | ICD-10-CM | POA: Diagnosis not present

## 2016-01-10 DIAGNOSIS — R110 Nausea: Secondary | ICD-10-CM | POA: Diagnosis not present

## 2016-01-10 DIAGNOSIS — Z923 Personal history of irradiation: Secondary | ICD-10-CM

## 2016-01-10 DIAGNOSIS — R197 Diarrhea, unspecified: Secondary | ICD-10-CM | POA: Diagnosis not present

## 2016-01-10 DIAGNOSIS — Z8051 Family history of malignant neoplasm of kidney: Secondary | ICD-10-CM

## 2016-01-10 DIAGNOSIS — E7800 Pure hypercholesterolemia, unspecified: Secondary | ICD-10-CM | POA: Diagnosis not present

## 2016-01-10 DIAGNOSIS — D490 Neoplasm of unspecified behavior of digestive system: Secondary | ICD-10-CM | POA: Diagnosis present

## 2016-01-10 DIAGNOSIS — K219 Gastro-esophageal reflux disease without esophagitis: Secondary | ICD-10-CM | POA: Diagnosis present

## 2016-01-10 DIAGNOSIS — K3580 Unspecified acute appendicitis: Secondary | ICD-10-CM | POA: Diagnosis not present

## 2016-01-10 DIAGNOSIS — Z9841 Cataract extraction status, right eye: Secondary | ICD-10-CM

## 2016-01-10 DIAGNOSIS — R109 Unspecified abdominal pain: Secondary | ICD-10-CM | POA: Diagnosis present

## 2016-01-10 DIAGNOSIS — R160 Hepatomegaly, not elsewhere classified: Secondary | ICD-10-CM | POA: Diagnosis not present

## 2016-01-10 DIAGNOSIS — Z8249 Family history of ischemic heart disease and other diseases of the circulatory system: Secondary | ICD-10-CM

## 2016-01-10 DIAGNOSIS — K7689 Other specified diseases of liver: Secondary | ICD-10-CM | POA: Diagnosis not present

## 2016-01-10 LAB — CBC WITH DIFFERENTIAL/PLATELET
BASOS ABS: 0 10*3/uL (ref 0–0.1)
BASOS PCT: 0 %
EOS ABS: 0.1 10*3/uL (ref 0–0.7)
Eosinophils Relative: 1 %
HCT: 42.8 % (ref 35.0–47.0)
Hemoglobin: 14.7 g/dL (ref 12.0–16.0)
Lymphocytes Relative: 7 %
Lymphs Abs: 1 10*3/uL (ref 1.0–3.6)
MCH: 29.6 pg (ref 26.0–34.0)
MCHC: 34.3 g/dL (ref 32.0–36.0)
MCV: 86.5 fL (ref 80.0–100.0)
MONO ABS: 1.2 10*3/uL — AB (ref 0.2–0.9)
MONOS PCT: 9 %
NEUTROS PCT: 83 %
Neutro Abs: 11.5 10*3/uL — ABNORMAL HIGH (ref 1.4–6.5)
Platelets: 217 10*3/uL (ref 150–440)
RBC: 4.95 MIL/uL (ref 3.80–5.20)
RDW: 13.4 % (ref 11.5–14.5)
WBC: 13.7 10*3/uL — ABNORMAL HIGH (ref 3.6–11.0)

## 2016-01-10 LAB — COMPREHENSIVE METABOLIC PANEL
ALBUMIN: 4.1 g/dL (ref 3.5–5.0)
ALK PHOS: 58 U/L (ref 38–126)
ALT: 17 U/L (ref 14–54)
ANION GAP: 9 (ref 5–15)
AST: 28 U/L (ref 15–41)
BILIRUBIN TOTAL: 0.8 mg/dL (ref 0.3–1.2)
BUN: 23 mg/dL — ABNORMAL HIGH (ref 6–20)
CALCIUM: 9.7 mg/dL (ref 8.9–10.3)
CO2: 25 mmol/L (ref 22–32)
Chloride: 101 mmol/L (ref 101–111)
Creatinine, Ser: 1.52 mg/dL — ABNORMAL HIGH (ref 0.44–1.00)
GFR calc Af Amer: 36 mL/min — ABNORMAL LOW (ref >60)
GFR, EST NON AFRICAN AMERICAN: 31 mL/min — AB (ref >60)
GLUCOSE: 166 mg/dL — AB (ref 65–99)
Potassium: 4 mmol/L (ref 3.5–5.1)
Sodium: 135 mmol/L (ref 135–145)
TOTAL PROTEIN: 6.6 g/dL (ref 6.5–8.1)

## 2016-01-10 LAB — URINALYSIS COMPLETE WITH MICROSCOPIC (ARMC ONLY)
BILIRUBIN URINE: NEGATIVE
Bacteria, UA: NONE SEEN
Glucose, UA: NEGATIVE mg/dL
HGB URINE DIPSTICK: NEGATIVE
KETONES UR: NEGATIVE mg/dL
NITRITE: NEGATIVE
PH: 5 (ref 5.0–8.0)
Protein, ur: NEGATIVE mg/dL
RBC / HPF: NONE SEEN RBC/hpf (ref 0–5)
SPECIFIC GRAVITY, URINE: 1.009 (ref 1.005–1.030)

## 2016-01-10 LAB — LACTIC ACID, PLASMA: LACTIC ACID, VENOUS: 1.3 mmol/L (ref 0.5–1.9)

## 2016-01-10 MED ORDER — FAMOTIDINE IN NACL 20-0.9 MG/50ML-% IV SOLN
20.0000 mg | Freq: Two times a day (BID) | INTRAVENOUS | Status: DC
  Administered 2016-01-10: 20 mg via INTRAVENOUS
  Filled 2016-01-10 (×2): qty 50

## 2016-01-10 MED ORDER — MORPHINE SULFATE (PF) 2 MG/ML IV SOLN
INTRAVENOUS | Status: AC
  Filled 2016-01-10: qty 1

## 2016-01-10 MED ORDER — ONDANSETRON HCL 4 MG/2ML IJ SOLN
4.0000 mg | Freq: Four times a day (QID) | INTRAMUSCULAR | Status: DC | PRN
  Administered 2016-01-11 – 2016-01-16 (×7): 4 mg via INTRAVENOUS
  Filled 2016-01-10 (×7): qty 2

## 2016-01-10 MED ORDER — KCL IN DEXTROSE-NACL 20-5-0.45 MEQ/L-%-% IV SOLN
INTRAVENOUS | Status: DC
  Administered 2016-01-10 (×2): via INTRAVENOUS
  Filled 2016-01-10 (×10): qty 1000

## 2016-01-10 MED ORDER — ACETAMINOPHEN 500 MG PO TABS
1000.0000 mg | ORAL_TABLET | Freq: Once | ORAL | Status: AC
  Administered 2016-01-10: 1000 mg via ORAL

## 2016-01-10 MED ORDER — PIPERACILLIN-TAZOBACTAM 3.375 G IVPB
3.3750 g | Freq: Three times a day (TID) | INTRAVENOUS | Status: DC
  Administered 2016-01-10 – 2016-01-17 (×20): 3.375 g via INTRAVENOUS
  Filled 2016-01-10 (×20): qty 50

## 2016-01-10 MED ORDER — DIPHENHYDRAMINE HCL 12.5 MG/5ML PO ELIX: 12.5000 mg | ORAL_SOLUTION | Freq: Four times a day (QID) | ORAL | Status: DC | PRN

## 2016-01-10 MED ORDER — HYDRALAZINE HCL 20 MG/ML IJ SOLN: 10.0000 mg | INTRAMUSCULAR | Status: DC | PRN

## 2016-01-10 MED ORDER — BARIUM SULFATE 2.1 % PO SUSP
450.0000 mL | ORAL | Status: AC
  Administered 2016-01-10 (×2): 450 mL via ORAL

## 2016-01-10 MED ORDER — ACETAMINOPHEN 325 MG PO TABS
ORAL_TABLET | ORAL | Status: AC
  Administered 2016-01-10: 650 mg via ORAL
  Filled 2016-01-10: qty 2

## 2016-01-10 MED ORDER — PIPERACILLIN-TAZOBACTAM 3.375 G IVPB 30 MIN
3.3750 g | Freq: Four times a day (QID) | INTRAVENOUS | Status: DC
  Administered 2016-01-10: 3.375 g via INTRAVENOUS

## 2016-01-10 MED ORDER — ACETAMINOPHEN 10 MG/ML IV SOLN
1000.0000 mg | Freq: Once | INTRAVENOUS | Status: AC
  Administered 2016-01-10: 1000 mg via INTRAVENOUS
  Filled 2016-01-10: qty 100

## 2016-01-10 MED ORDER — ACETAMINOPHEN 325 MG PO TABS
650.0000 mg | ORAL_TABLET | Freq: Once | ORAL | Status: AC
  Administered 2016-01-10: 650 mg via ORAL

## 2016-01-10 MED ORDER — DIPHENHYDRAMINE HCL 50 MG/ML IJ SOLN: 12.5000 mg | Freq: Four times a day (QID) | INTRAMUSCULAR | Status: DC | PRN

## 2016-01-10 MED ORDER — PIPERACILLIN-TAZOBACTAM 3.375 G IVPB
INTRAVENOUS | Status: AC
Start: 2016-01-10 — End: 2016-01-10
  Administered 2016-01-10: 3.375 g via INTRAVENOUS
  Filled 2016-01-10: qty 50

## 2016-01-10 MED ORDER — MORPHINE SULFATE (PF) 2 MG/ML IV SOLN
2.0000 mg | Freq: Once | INTRAVENOUS | Status: AC
  Administered 2016-01-10: 2 mg via INTRAVENOUS
  Filled 2016-01-10: qty 1

## 2016-01-10 MED ORDER — ONDANSETRON HCL 4 MG/2ML IJ SOLN
4.0000 mg | Freq: Once | INTRAMUSCULAR | Status: AC
  Administered 2016-01-10: 4 mg via INTRAVENOUS

## 2016-01-10 MED ORDER — ACETAMINOPHEN 10 MG/ML IV SOLN
1000.0000 mg | Freq: Four times a day (QID) | INTRAVENOUS | Status: AC | PRN
  Administered 2016-01-10 – 2016-01-11 (×2): 1000 mg via INTRAVENOUS
  Filled 2016-01-10 (×4): qty 100

## 2016-01-10 MED ORDER — ACETAMINOPHEN 500 MG PO TABS
ORAL_TABLET | ORAL | Status: AC
  Filled 2016-01-10: qty 2

## 2016-01-10 MED ORDER — ONDANSETRON HCL 4 MG/2ML IJ SOLN
4.0000 mg | Freq: Once | INTRAMUSCULAR | Status: AC
  Administered 2016-01-10: 4 mg via INTRAVENOUS
  Filled 2016-01-10: qty 2

## 2016-01-10 MED ORDER — MORPHINE SULFATE (PF) 2 MG/ML IV SOLN
2.0000 mg | Freq: Once | INTRAVENOUS | Status: AC
  Administered 2016-01-10: 2 mg via INTRAVENOUS

## 2016-01-10 MED ORDER — FAMOTIDINE IN NACL 20-0.9 MG/50ML-% IV SOLN
20.0000 mg | INTRAVENOUS | Status: DC
  Filled 2016-01-10 (×2): qty 50

## 2016-01-10 MED ORDER — SODIUM CHLORIDE 0.9 % IV BOLUS (SEPSIS)
1000.0000 mL | Freq: Once | INTRAVENOUS | Status: AC
  Administered 2016-01-10: 1000 mL via INTRAVENOUS

## 2016-01-10 MED ORDER — ONDANSETRON HCL 4 MG/2ML IJ SOLN
INTRAMUSCULAR | Status: AC
  Administered 2016-01-10: 4 mg via INTRAVENOUS
  Filled 2016-01-10: qty 2

## 2016-01-10 MED ORDER — ONDANSETRON HCL 4 MG/2ML IJ SOLN
INTRAMUSCULAR | Status: AC
  Filled 2016-01-10: qty 2

## 2016-01-10 MED ORDER — MORPHINE SULFATE (PF) 2 MG/ML IV SOLN
2.0000 mg | INTRAVENOUS | Status: DC | PRN
Start: 2016-01-10 — End: 2016-01-11
  Administered 2016-01-10 (×3): 2 mg via INTRAVENOUS
  Filled 2016-01-10 (×3): qty 1

## 2016-01-10 MED ORDER — ONDANSETRON 4 MG PO TBDP: 4.0000 mg | ORAL_TABLET | Freq: Four times a day (QID) | ORAL | Status: DC | PRN

## 2016-01-10 NOTE — ED Notes (Signed)
Pt vomited after tylenol admin, MD notified

## 2016-01-10 NOTE — ED Provider Notes (Addendum)
[facility] Emergency Department Provider Note   First MD Initiated Contact with Patient 01/10/16 0130     (approximate)  I have reviewed the triage vital signs and the nursing notes.   HISTORY  Chief Complaint Abdominal Pain    HPI Diana Lester is a 82 y.o. female below listed chronic medical conditions presents to the emergency department with acute onset of 10 out of 10 right upper quadrant epigastric left upper quadrant abdominal pain at 7 PM last night after eating. Patient admits to nausea however denies any vomiting. Patient also admits to fever febrile on presentation with temperature 101.3.   Past Medical History:  Diagnosis Date  . Arthritis   . Breast cancer ([place]) 1995   LT LUMPECTOMY & s/p XRT  . Depression   . Hx of colonic polyp   . Hypercholesterolemia   . Hypertension   . Melanoma ([place])   . Spinal stenosis    lumbar    Patient Active Problem List   Diagnosis Date Noted  . Anxiety 10/20/2015  . Loss of weight 10/20/2015  . Burn 07/14/2015  . Hyponatremia 08/07/2014  . Health care maintenance 05/05/2014  . Skin cancer 01/29/2014  . Other malaise and fatigue 10/21/2013  . Skin lesion of cheek 10/21/2013  . Renal cyst 03/22/2013  . Chronic kidney disease (CKD), stage III (moderate) 03/14/2013  . Abdominal bruit 03/14/2013  . Spinal stenosis of lumbar region 12/09/2012  . Bulging disc 12/09/2012  . History of colonic polyps 12/09/2012  . History of breast cancer 12/09/2012  . Depression 12/09/2012  . Essential hypertension, benign 12/09/2012  . Hypercholesterolemia 12/09/2012    Past Surgical History:  Procedure Laterality Date  . BREAST BIOPSY Left 2006   EXCISIONAL - NEG  . BREAST BIOPSY Left 1995   LT LUMPECTOMY FOR CA  . BREAST LUMPECTOMY Left 1995  . EYE SURGERY Bilateral    cataract excision    Prior to Admission medications   Medication Sig Start Date End Date Taking? Authorizing Provider  aspirin 81 MG  tablet Take 81 mg by mouth daily.   Yes Historical Provider, MD  calcium-vitamin D (OSCAL WITH D) 500-200 MG-UNIT per tablet Take 2 tablets by mouth daily.   Yes Historical Provider, MD  clonazePAM (KLONOPIN) 0.5 MG tablet TAKE 1 TABLET BY MOUTH EVERY DAY TO TWICE DAILY AS NEEDED. 12/22/15  Yes Einar Pheasant, MD  lisinopril-hydrochlorothiazide (PRINZIDE,ZESTORETIC) 20-12.5 MG tablet TAKE 1 TABLET BY MOUTH EVERY DAY 05/11/15  Yes Einar Pheasant, MD  lovastatin (MEVACOR) 40 MG tablet TAKE 1-2 TABLETS BY MOUTH DAILY 07/12/15  Yes Einar Pheasant, MD  mirtazapine (REMERON) 30 MG tablet TAKE 1 TABLET(30 MG) BY MOUTH AT BEDTIME 10/27/15  Yes Einar Pheasant, MD  pregabalin (LYRICA) 50 MG capsule Take 50 mg by mouth daily.    Yes Historical Provider, MD  traMADol (ULTRAM) 50 MG tablet Take 25-50 mg by mouth every 8 (eight) hours as needed for pain.   Yes Historical Provider, MD    Allergies Contrast media [iodinated diagnostic agents]  Family History  Problem Relation Age of Onset  . Arthritis Mother   . Hyperlipidemia Mother   . Hypertension Mother   . Hyperlipidemia Sister   . Kidney cancer Sister   . Breast cancer Neg Hx     Social History Social History  Substance Use Topics  . Smoking status: Former Smoker    Packs/day: 0.50    Years: 4.00    Types: Cigarettes  Quit date: 12/09/1984  . Smokeless tobacco: Never Used     Comment: quit 12/09/84  . Alcohol use No     Comment: sparingly    Review of Systems Constitutional: No fever/chills Eyes: No visual changes. ENT: No sore throat. Cardiovascular: Denies chest pain. Respiratory: Denies shortness of breath. Gastrointestinal: Positive for abdominal pain.  No nausea, no vomiting.  No diarrhea.  No constipation. Genitourinary: Negative for dysuria. Musculoskeletal: Negative for back pain. Skin: Negative for rash. Neurological: Negative for headaches, focal weakness or numbness.  10-point ROS otherwise  negative.  ____________________________________________   PHYSICAL EXAM:  VITAL SIGNS: ED Triage Vitals  Enc Vitals Group     BP 01/10/16 0112 (!) 136/117     Pulse Rate 01/10/16 0112 95     Resp 01/10/16 0112 20     Temp 01/10/16 0112 (!) 101.3 F (38.5 C)     Temp Source 01/10/16 0112 Oral     SpO2 01/10/16 0112 96 %     Weight 01/10/16 0113 129 lb (58.5 kg)     Height 01/10/16 0113 5\' 3"  (1.6 m)     Head Circumference --      Peak Flow --      Pain Score 01/10/16 0113 8     Pain Loc --      Pain Edu? --      Excl. in [place]? --     Constitutional: Alert and oriented. Apparent discomfort. Eyes: Conjunctivae are normal. PERRL. EOMI. Head: Atraumatic. Ears:  Healthy appearing ear canals and TMs bilaterally Nose: No congestion/rhinnorhea. Mouth/Throat: Mucous membranes are moist.  Oropharynx non-erythematous. Neck: No stridor.  No meningeal signs.  No cervical spine tenderness to palpation. Cardiovascular: Normal rate, regular rhythm. Good peripheral circulation. Grossly normal heart sounds. Respiratory: Normal respiratory effort.  No retractions. Lungs CTAB. Gastrointestinal: Right lower quadrant right upper quadrant epigastric tenderness to palpation. No distention.  Musculoskeletal: No lower extremity tenderness nor edema. No gross deformities of extremities. Neurologic:  Normal speech and language. No gross focal neurologic deficits are appreciated.  Skin:  Skin is warm, dry and intact. No rash noted. Psychiatric: Mood and affect are normal. Speech and behavior are normal.  ____________________________________________   LABS (all labs ordered are listed, but only abnormal results are displayed)  Labs Reviewed  COMPREHENSIVE METABOLIC PANEL - Abnormal; Notable for the following:       Result Value   Glucose, Bld 166 (*)    BUN 23 (*)    Creatinine, Ser 1.52 (*)    GFR calc non Af Amer 31 (*)    GFR calc Af Amer 36 (*)    All other components within normal limits   CBC WITH DIFFERENTIAL/PLATELET - Abnormal; Notable for the following:    WBC 13.7 (*)    Neutro Abs 11.5 (*)    Monocytes Absolute 1.2 (*)    All other components within normal limits  URINALYSIS COMPLETEWITH MICROSCOPIC ([facility] ONLY) - Abnormal; Notable for the following:    Color, Urine YELLOW (*)    APPearance CLEAR (*)    Leukocytes, UA TRACE (*)    Squamous Epithelial / LPF 0-5 (*)    All other components within normal limits  CULTURE, BLOOD (ROUTINE X 2)  CULTURE, BLOOD (ROUTINE X 2)  URINE CULTURE  LACTIC ACID, PLASMA   ____________________________________________  EKG  ED ECG REPORT I, [place] Vinay Ertl, the attending physician, personally viewed and interpreted this ECG.   Date: 01/10/2016  EKG Time: 1:15 AM  Rate:  93  Rhythm: Normal sinus rhythm  Axis: Normal  Intervals: Normal  ST&T Change: None  ____________________________________________  RADIOLOGY I, [place] Josefita Weissmann, personally viewed and evaluated these images (plain radiographs) as part of my medical decision making, as well as reviewing the written report by the radiologist.  Ct Abdomen Pelvis Wo Contrast  Result Date: 01/10/2016 CLINICAL DATA:  Right lower quadrant pain onset this evening. Febrile. EXAM: CT ABDOMEN AND PELVIS WITHOUT CONTRAST TECHNIQUE: Multidetector CT imaging of the abdomen and pelvis was performed following the standard protocol without IV contrast. COMPARISON:  None. FINDINGS: Lower chest: Noncalcified nodules in the right lung base, at least 3 in number, measuring 3-5 mm. These are visible on images 5 and 7, series 3. Hepatobiliary: Multiple solid liver masses measuring from 0.8-2.6 cm. Suspicious for hepatic hematogenous metastases. Gallbladder and bile ducts are unremarkable. Pancreas: Unremarkable. No pancreatic ductal dilatation or surrounding inflammatory changes. Spleen: Normal in size without focal abnormality. Adrenals/Urinary Tract: Adrenal glands are unremarkable. Kidneys are  normal, without renal calculi, focal lesion, or hydronephrosis. Bladder is unremarkable. Stomach/Bowel: Stomach and small bowel are unremarkable. There is acute inflammation surrounding the tip of the cecum, as well as the appendix. This likely represents acute acute appendicitis. There also is diverticular disease of the cecum and ascending colon but the inflammation is not centered on a diverticulum. No extraluminal air or drainable fluid collection. Remainder of the colon is remarkable only for extensive diverticulosis. Vascular/Lymphatic: Aortic atherosclerosis. No enlarged abdominal or pelvic lymph nodes. Reproductive: Uterus and bilateral adnexa are unremarkable. Other: No ascites. Musculoskeletal: No significant skeletal lesions. IMPRESSION: 1. Acute inflammatory process surrounding the cecal tip, likely appendicitis. 2. Multiple solid liver lesions, suspicious for hematogenous metastases. Recommend CT or MRI with contrast. 3. Indeterminate noncalcified nodules in the right lung base. 4. Colonic diverticulosis These results were called by telephone at the time of interpretation on 01/10/2016 at 5:27 am to Dr. Marjean Donna , who verbally acknowledged these results. Electronically Signed   By: Andreas Newport M.D.   On: 01/10/2016 05:33   Dg Chest 2 View  Result Date: 01/10/2016 CLINICAL DATA:  Right lower quadrant pain since evening. EXAM: CHEST  2 VIEW COMPARISON:  04/07/2010 FINDINGS: Mild unchanged hyperinflation. Minimal linear scarring in the bases. The lungs are otherwise clear. Hilar, mediastinal and cardiac contours are normal and unchanged. Pulmonary vasculature is normal. No pleural effusions. IMPRESSION: Hyperinflation, unchanged.  No acute findings are evident. Electronically Signed   By: Andreas Newport M.D.   On: 01/10/2016 01:47     Procedures     INITIAL IMPRESSION / ASSESSMENT AND PLAN / ED COURSE  Pertinent labs & imaging results that were available during my care of the  patient were reviewed by me and considered in my medical decision making (see chart for details).  Given history physical exam and CT scan findings patient discussed with Dr. Adonis Huguenin for hospital admission for further evaluation and management for acute appendicitis and liver mass.      Clinical Course    ____________________________________________  FINAL CLINICAL IMPRESSION(S) / ED DIAGNOSES  Final diagnoses:  Acute appendicitis, unspecified acute appendicitis type  Liver mass     MEDICATIONS GIVEN DURING THIS VISIT:  Medications  morphine 2 MG/ML injection 2 mg (2 mg Intravenous Given 01/10/16 0210)  ondansetron (ZOFRAN) injection 4 mg (4 mg Intravenous Given 01/10/16 0210)  Barium Sulfate 2.1 % SUSP 450 mL (450 mLs Oral Given by Other 01/10/16 0330)  acetaminophen (TYLENOL) tablet 650 mg (650 mg  Oral Given 01/10/16 0224)  morphine 2 MG/ML injection 2 mg (2 mg Intravenous Given 01/10/16 0518)  sodium chloride 0.9 % bolus 1,000 mL (1,000 mLs Intravenous New Bag/Given 01/10/16 0517)  ondansetron (ZOFRAN) injection 4 mg (4 mg Intravenous Given 01/10/16 0525)  acetaminophen (TYLENOL) tablet 1,000 mg (1,000 mg Oral Given 01/10/16 0606)     NEW OUTPATIENT MEDICATIONS STARTED DURING THIS VISIT:  New Prescriptions   No medications on file    Modified Medications   No medications on file    Discontinued Medications   No medications on file     Note:  This document was prepared using Dragon voice recognition software and may include unintentional dictation errors.    Gregor Hams, MD 01/10/16 [id number]    Gregor Hams, MD 01/10/16 [phone]

## 2016-01-10 NOTE — H&P (Signed)
[name], female   DOB: [birth year], 82 y.o.   MRN: [id number]  CC: Abdominal Pain  HPI Diana Lester is a 82 y.o. female who presents to the ER department with a 1 day history of abdominal pain. Patient states that yesterday evening she started to feel ill along with abdominal discomfort that localized to the right lower quadrant. She had at least one episode of nausea and vomiting and has had fevers and chills. She describes the pain as a constant ache with worsening pain with palpation. She states she's never had anything like this before. She denies any recent changes in her health, she denies any sick contacts, she denies any chest pain, shortness of breath, diarrhea, constipation.  HPI  Past Medical History:  Diagnosis Date  . Arthritis   . Breast cancer ([place]) 1995   LT LUMPECTOMY & s/p XRT  . Depression   . Hx of colonic polyp   . Hypercholesterolemia   . Hypertension   . Melanoma ([place])   . Spinal stenosis    lumbar    Past Surgical History:  Procedure Laterality Date  . BREAST BIOPSY Left 2006   EXCISIONAL - NEG  . BREAST BIOPSY Left 1995   LT LUMPECTOMY FOR CA  . BREAST LUMPECTOMY Left 1995  . EYE SURGERY Bilateral    cataract excision    Family History  Problem Relation Age of Onset  . Arthritis Mother   . Hyperlipidemia Mother   . Hypertension Mother   . Hyperlipidemia Sister   . Kidney cancer Sister   . Breast cancer Neg Hx     Social History Social History  Substance Use Topics  . Smoking status: Former Smoker    Packs/day: 0.50    Years: 4.00    Types: Cigarettes    Quit date: 12/09/1984  . Smokeless tobacco: Never Used     Comment: quit 12/09/84  . Alcohol use No     Comment: sparingly    Allergies  Allergen Reactions  . Contrast Media [Iodinated Diagnostic Agents] Hives    Current Facility-Administered Medications  Medication Dose Route Frequency Provider Last Rate Last Dose  . acetaminophen (OFIRMEV) IV 1,000 mg  1,000  mg Intravenous Once Gregor Hams, MD      . piperacillin-tazobactam (ZOSYN) IVPB 3.375 g  3.375 g Intravenous Q6H Clayburn Pert, MD       Current Outpatient Prescriptions  Medication Sig Dispense Refill  . aspirin 81 MG tablet Take 81 mg by mouth daily.    . calcium-vitamin D (OSCAL WITH D) 500-200 MG-UNIT per tablet Take 2 tablets by mouth daily.    . clonazePAM (KLONOPIN) 0.5 MG tablet TAKE 1 TABLET BY MOUTH EVERY DAY TO TWICE DAILY AS NEEDED. 135 tablet 0  . lisinopril-hydrochlorothiazide (PRINZIDE,ZESTORETIC) 20-12.5 MG tablet TAKE 1 TABLET BY MOUTH EVERY DAY 90 tablet 3  . lovastatin (MEVACOR) 40 MG tablet TAKE 1-2 TABLETS BY MOUTH DAILY 180 tablet 3  . mirtazapine (REMERON) 30 MG tablet TAKE 1 TABLET(30 MG) BY MOUTH AT BEDTIME 90 tablet 0  . pregabalin (LYRICA) 50 MG capsule Take 50 mg by mouth daily.     . traMADol (ULTRAM) 50 MG tablet Take 25-50 mg by mouth every 8 (eight) hours as needed for pain.       Review of Systems A Multi-point review of systems was asked and was negative except for the findings documented in the history of present illness  Physical Exam Blood pressure (!) 133/94, pulse  84, temperature 100.2 F (37.9 C), temperature source Oral, resp. rate 17, height 5\' 3"  (1.6 m), weight 58.5 kg (129 lb), SpO2 98 %. CONSTITUTIONAL: No acute distress. EYES: Pupils are equal, round, and reactive to light, Sclera are non-icteric. EARS, NOSE, MOUTH AND THROAT: The oropharynx is clear. The oral mucosa is pink and moist. Hearing is intact to voice. LYMPH NODES:  Lymph nodes in the neck are normal. RESPIRATORY:  Lungs are clear. There is normal respiratory effort, with equal breath sounds bilaterally, and without pathologic use of accessory muscles. CARDIOVASCULAR: Heart is regular without murmurs, gallops, or rubs. GI: The abdomen is soft, tender to palpation in the right lower quadrant without rebound but with some guarding, and nondistended. There are no palpable  masses. There is no hepatosplenomegaly. There are normal bowel sounds in all quadrants. GU: Rectal deferred.   MUSCULOSKELETAL: Normal muscle strength and tone. No cyanosis or edema.   SKIN: Turgor is good and there are no pathologic skin lesions or ulcers. NEUROLOGIC: Motor and sensation is grossly normal. Cranial nerves are grossly intact. PSYCH:  Oriented to person, place and time. Affect is normal.  Data Reviewed Images and labs reviewed. Labs concerning for leukocytosis of 13.7, mildly elevated creatinine of 1.52, mildly elevated BUN of 23. CT scan reviewed which does appear to show inflammation to the terminal ileum and cecum around where the appendix should be. What appears to be the appendix is dilated and involved in the inflammation. This is consistent with appendicitis. However, the inflammation also involves the cecum which makes typhlitis a possibility. There also appears to be a mass within the liver which would be consistent with metastatic cancer of unknown etiology. I have personally reviewed the patient's imaging, laboratory findings and medical records.    Assessment    Abdominal pain    Plan    82 year old female with a one-day history of abdominal pain and image findings concerning for appendicitis versus typhlitis. This is separate from the likely metastatic disease pertinent that her liver. Discussed the CT findings as well as her exam findings with the patient. Patient voiced understanding. Plan for admission to the hospital, IV hydration, IV antibiotics, as needed medications for pain, itching, nausea. Discussed that she may require an operation for the inflammation around her appendix. If that operation is undertaken could potentially biopsy of the liver lesion at the same time if it is visible and easily accessible. Patient voiced understanding and is accepting of this plan. She understands that she'll be seen by my partner, Dr. Pat Patrick, later today.     Time spent with  the patient was 50 minutes, with more than 50% of the time spent in face-to-face education, counseling and care coordination.     Clayburn Pert, MD FACS General Surgeon 01/10/2016, 6:32 AM

## 2016-01-10 NOTE — Progress Notes (Signed)
Renal Adjustment of Famotidine:    82 yo female ordered Famotidine 20mg  IV every 12 hours.   CrCl = 23.6 ml/min.    Pharmacy adjusted dose to Famotidine 20mg  IV every 24 hours as per policy for patients with CrCl <30 ml/min.

## 2016-01-10 NOTE — Progress Notes (Signed)
Subjective:   She seems to be feeling about the same. She has mild abdominal pain when she moves but is comfortable at rest. She has no nausea or vomiting. She remains with low-grade temperature. She's not had any bowel function.  Vital signs in last 24 hours: Temp:  [99 F (37.2 C)-101.3 F (38.5 C)] 101 F (38.3 C) (11/04 0756) Pulse Rate:  [69-99] 89 (11/04 0756) Resp:  [12-30] 19 (11/04 0756) BP: (92-136)/(36-117) 109/48 (11/04 0756) SpO2:  [94 %-98 %] 97 % (11/04 0756) Weight:  [58.5 kg (129 lb)] 58.5 kg (129 lb) (11/04 0113)    Intake/Output from previous day: No intake/output data recorded.  Exam:  Her abdomen is soft but she still has right lower quadrant tenderness with guarding but no rebound. She does have active bowel sounds.  Lab Results:  CBC  Recent Labs  01/10/16 0121  WBC 13.7*  HGB 14.7  HCT 42.8  PLT 217   CMP     Component Value Date/Time   NA 135 01/10/2016 0121   K 4.0 01/10/2016 0121   CL 101 01/10/2016 0121   CO2 25 01/10/2016 0121   GLUCOSE 166 (H) 01/10/2016 0121   BUN 23 (H) 01/10/2016 0121   CREATININE 1.52 (H) 01/10/2016 0121   CALCIUM 9.7 01/10/2016 0121   PROT 6.6 01/10/2016 0121   ALBUMIN 4.1 01/10/2016 0121   AST 28 01/10/2016 0121   ALT 17 01/10/2016 0121   ALKPHOS 58 01/10/2016 0121   BILITOT 0.8 01/10/2016 0121   GFRNONAA 31 (L) 01/10/2016 0121   GFRAA 36 (L) 01/10/2016 0121   PT/INR No results for input(s): LABPROT, INR in the last 72 hours.  Studies/Results: Ct Abdomen Pelvis Wo Contrast  Result Date: 01/10/2016 CLINICAL DATA:  Right lower quadrant pain onset this evening. Febrile. EXAM: CT ABDOMEN AND PELVIS WITHOUT CONTRAST TECHNIQUE: Multidetector CT imaging of the abdomen and pelvis was performed following the standard protocol without IV contrast. COMPARISON:  None. FINDINGS: Lower chest: Noncalcified nodules in the right lung base, at least 3 in number, measuring 3-5 mm. These are visible on images 5 and 7, series  3. Hepatobiliary: Multiple solid liver masses measuring from 0.8-2.6 cm. Suspicious for hepatic hematogenous metastases. Gallbladder and bile ducts are unremarkable. Pancreas: Unremarkable. No pancreatic ductal dilatation or surrounding inflammatory changes. Spleen: Normal in size without focal abnormality. Adrenals/Urinary Tract: Adrenal glands are unremarkable. Kidneys are normal, without renal calculi, focal lesion, or hydronephrosis. Bladder is unremarkable. Stomach/Bowel: Stomach and small bowel are unremarkable. There is acute inflammation surrounding the tip of the cecum, as well as the appendix. This likely represents acute acute appendicitis. There also is diverticular disease of the cecum and ascending colon but the inflammation is not centered on a diverticulum. No extraluminal air or drainable fluid collection. Remainder of the colon is remarkable only for extensive diverticulosis. Vascular/Lymphatic: Aortic atherosclerosis. No enlarged abdominal or pelvic lymph nodes. Reproductive: Uterus and bilateral adnexa are unremarkable. Other: No ascites. Musculoskeletal: No significant skeletal lesions. IMPRESSION: 1. Acute inflammatory process surrounding the cecal tip, likely appendicitis. 2. Multiple solid liver lesions, suspicious for hematogenous metastases. Recommend CT or MRI with contrast. 3. Indeterminate noncalcified nodules in the right lung base. 4. Colonic diverticulosis These results were called by telephone at the time of interpretation on 01/10/2016 at 5:27 am to Dr. Marjean Donna , who verbally acknowledged these results. Electronically Signed   By: Andreas Newport M.D.   On: 01/10/2016 05:33   Dg Chest 2 View  Result Date: 01/10/2016  CLINICAL DATA:  Right lower quadrant pain since evening. EXAM: CHEST  2 VIEW COMPARISON:  04/07/2010 FINDINGS: Mild unchanged hyperinflation. Minimal linear scarring in the bases. The lungs are otherwise clear. Hilar, mediastinal and cardiac contours are  normal and unchanged. Pulmonary vasculature is normal. No pleural effusions. IMPRESSION: Hyperinflation, unchanged.  No acute findings are evident. Electronically Signed   By: Andreas Newport M.D.   On: 01/10/2016 01:47    Assessment/Plan: She seems to be doing relatively well on antibiotic therapy. Her daughter was present and we discussed indications for intervention once again. We'll continue to follow her with her clinical examination and laboratory evaluation. If she continues to have symptoms this severe we may consider laparoscopy surgery. She is in agreement.

## 2016-01-10 NOTE — ED Triage Notes (Addendum)
Pt to rm 6 via EMS from home, report RLQ pain since this evening, reports some yard work yesterday and might have pulled muscle.  Pt denies n/v/d, denies dysuria.  PT febrile upon arrival 101.3.  EMS report pt tachy around 115 but pt HR in 90s upon arrival

## 2016-01-11 ENCOUNTER — Encounter
Admission: EM | Disposition: A | Discharge status: Home Health | Payer: Self-pay | Source: Home / Self Care | Attending: General Surgery

## 2016-01-11 ENCOUNTER — Inpatient Hospital Stay: Payer: PPO | Admitting: Anesthesiology

## 2016-01-11 ENCOUNTER — Encounter: Payer: Self-pay | Admitting: Anesthesiology

## 2016-01-11 DIAGNOSIS — K7689 Other specified diseases of liver: Secondary | ICD-10-CM

## 2016-01-11 DIAGNOSIS — K352 Acute appendicitis with generalized peritonitis: Principal | ICD-10-CM

## 2016-01-11 DIAGNOSIS — D134 Benign neoplasm of liver: Secondary | ICD-10-CM | POA: Diagnosis not present

## 2016-01-11 DIAGNOSIS — K37 Unspecified appendicitis: Secondary | ICD-10-CM | POA: Diagnosis not present

## 2016-01-11 DIAGNOSIS — C229 Malignant neoplasm of liver, not specified as primary or secondary: Secondary | ICD-10-CM | POA: Diagnosis not present

## 2016-01-11 HISTORY — PX: LAPAROSCOPIC APPENDECTOMY: SHX408

## 2016-01-11 LAB — CBC
HEMATOCRIT: 39.2 % (ref 35.0–47.0)
HEMOGLOBIN: 13.1 g/dL (ref 12.0–16.0)
MCH: 29.7 pg (ref 26.0–34.0)
MCHC: 33.4 g/dL (ref 32.0–36.0)
MCV: 88.9 fL (ref 80.0–100.0)
Platelets: 179 10*3/uL (ref 150–440)
RBC: 4.41 MIL/uL (ref 3.80–5.20)
RDW: 13.9 % (ref 11.5–14.5)
WBC: 19 10*3/uL — ABNORMAL HIGH (ref 3.6–11.0)

## 2016-01-11 LAB — BASIC METABOLIC PANEL
Anion gap: 4 — ABNORMAL LOW (ref 5–15)
BUN: 17 mg/dL (ref 6–20)
CHLORIDE: 105 mmol/L (ref 101–111)
CO2: 26 mmol/L (ref 22–32)
CREATININE: 1.75 mg/dL — AB (ref 0.44–1.00)
Calcium: 8.5 mg/dL — ABNORMAL LOW (ref 8.9–10.3)
GFR calc non Af Amer: 26 mL/min — ABNORMAL LOW (ref >60)
GFR, EST AFRICAN AMERICAN: 30 mL/min — AB (ref >60)
Glucose, Bld: 136 mg/dL — ABNORMAL HIGH (ref 65–99)
POTASSIUM: 4.1 mmol/L (ref 3.5–5.1)
SODIUM: 135 mmol/L (ref 135–145)

## 2016-01-11 LAB — C DIFFICILE QUICK SCREEN W PCR REFLEX
C DIFFICILE (CDIFF) INTERP: NOT DETECTED
C DIFFICILE (CDIFF) TOXIN: NEGATIVE
C DIFFICLE (CDIFF) ANTIGEN: NEGATIVE

## 2016-01-11 LAB — HEMOGLOBIN: Hemoglobin: 14.1 g/dL (ref 12.0–16.0)

## 2016-01-11 LAB — MAGNESIUM: MAGNESIUM: 1.6 mg/dL — AB (ref 1.7–2.4)

## 2016-01-11 LAB — URINE CULTURE

## 2016-01-11 LAB — MRSA PCR SCREENING: MRSA BY PCR: NEGATIVE

## 2016-01-11 LAB — PHOSPHORUS: PHOSPHORUS: 2.9 mg/dL (ref 2.5–4.6)

## 2016-01-11 SURGERY — APPENDECTOMY, LAPAROSCOPIC
Anesthesia: General

## 2016-01-11 MED ORDER — CLONAZEPAM 0.5 MG PO TABS
0.2500 mg | ORAL_TABLET | Freq: Two times a day (BID) | ORAL | Status: DC | PRN
  Administered 2016-01-14 – 2016-01-17 (×4): 0.25 mg via ORAL
  Filled 2016-01-11 (×4): qty 1

## 2016-01-11 MED ORDER — PROPOFOL 10 MG/ML IV BOLUS
INTRAVENOUS | Status: DC | PRN
  Administered 2016-01-11: 120 mg via INTRAVENOUS

## 2016-01-11 MED ORDER — BUPIVACAINE HCL (PF) 0.25 % IJ SOLN
INTRAMUSCULAR | Status: AC
  Filled 2016-01-11: qty 30

## 2016-01-11 MED ORDER — MORPHINE SULFATE (PF) 4 MG/ML IV SOLN: 4.0000 mg | INTRAVENOUS | Status: DC | PRN

## 2016-01-11 MED ORDER — CLONAZEPAM 0.125 MG PO TBDP: 0.2500 mg | ORAL_TABLET | Freq: Two times a day (BID) | ORAL | Status: DC | PRN

## 2016-01-11 MED ORDER — LACTATED RINGERS IV SOLN
INTRAVENOUS | Status: DC | PRN
  Administered 2016-01-11: 11:00:00 via INTRAVENOUS

## 2016-01-11 MED ORDER — HEPARIN SODIUM (PORCINE) 5000 UNIT/ML IJ SOLN
INTRAMUSCULAR | Status: AC
  Filled 2016-01-11: qty 1

## 2016-01-11 MED ORDER — SODIUM CHLORIDE 0.9 % IV BOLUS (SEPSIS)
500.0000 mL | Freq: Once | INTRAVENOUS | Status: AC
  Administered 2016-01-11: 500 mL via INTRAVENOUS

## 2016-01-11 MED ORDER — DEXAMETHASONE SODIUM PHOSPHATE 10 MG/ML IJ SOLN
INTRAMUSCULAR | Status: DC | PRN
  Administered 2016-01-11: 5 mg via INTRAVENOUS

## 2016-01-11 MED ORDER — FENTANYL CITRATE (PF) 100 MCG/2ML IJ SOLN
25.0000 ug | INTRAMUSCULAR | Status: DC | PRN
  Administered 2016-01-11 (×4): 25 ug via INTRAVENOUS

## 2016-01-11 MED ORDER — MORPHINE SULFATE (PF) 2 MG/ML IV SOLN
4.0000 mg | INTRAVENOUS | Status: DC | PRN
  Administered 2016-01-11: 4 mg via INTRAVENOUS
  Filled 2016-01-11: qty 2

## 2016-01-11 MED ORDER — ONDANSETRON HCL 4 MG/2ML IJ SOLN: 4.0000 mg | Freq: Once | INTRAMUSCULAR | Status: DC | PRN

## 2016-01-11 MED ORDER — HYDROMORPHONE HCL 1 MG/ML IJ SOLN
INTRAMUSCULAR | Status: AC
  Administered 2016-01-11: 0.25 mg via INTRAVENOUS
  Filled 2016-01-11: qty 1

## 2016-01-11 MED ORDER — GLYCOPYRROLATE 0.2 MG/ML IJ SOLN
INTRAMUSCULAR | Status: DC | PRN
  Administered 2016-01-11: 0.2 mg via INTRAVENOUS

## 2016-01-11 MED ORDER — HYDROMORPHONE HCL 1 MG/ML IJ SOLN
0.2500 mg | Freq: Once | INTRAMUSCULAR | Status: AC
  Administered 2016-01-11: 0.25 mg via INTRAVENOUS

## 2016-01-11 MED ORDER — CLONAZEPAM 0.125 MG PO TBDP: 0.1250 mg | ORAL_TABLET | Freq: Two times a day (BID) | ORAL | Status: DC | PRN

## 2016-01-11 MED ORDER — LIDOCAINE HCL (CARDIAC) 20 MG/ML IV SOLN
INTRAVENOUS | Status: DC | PRN
  Administered 2016-01-11: 30 mg via INTRAVENOUS

## 2016-01-11 MED ORDER — SUCCINYLCHOLINE CHLORIDE 20 MG/ML IJ SOLN
INTRAMUSCULAR | Status: DC | PRN
  Administered 2016-01-11: 100 mg via INTRAVENOUS

## 2016-01-11 MED ORDER — BUPIVACAINE HCL (PF) 0.25 % IJ SOLN
INTRAMUSCULAR | Status: DC | PRN
  Administered 2016-01-11: 30 mL

## 2016-01-11 MED ORDER — ROCURONIUM BROMIDE 100 MG/10ML IV SOLN
INTRAVENOUS | Status: DC | PRN
  Administered 2016-01-11: 5 mg via INTRAVENOUS

## 2016-01-11 MED ORDER — FENTANYL CITRATE (PF) 100 MCG/2ML IJ SOLN
INTRAMUSCULAR | Status: DC | PRN
  Administered 2016-01-11 (×6): 25 ug via INTRAVENOUS
  Administered 2016-01-11: 50 ug via INTRAVENOUS

## 2016-01-11 MED ORDER — NEOSTIGMINE METHYLSULFATE 10 MG/10ML IV SOLN
INTRAVENOUS | Status: DC | PRN
  Administered 2016-01-11: 1 mg via INTRAVENOUS

## 2016-01-11 MED ORDER — KCL IN DEXTROSE-NACL 20-5-0.45 MEQ/L-%-% IV SOLN
INTRAVENOUS | Status: DC
  Administered 2016-01-11 – 2016-01-15 (×9): via INTRAVENOUS
  Filled 2016-01-11 (×13): qty 1000

## 2016-01-11 MED ORDER — FENTANYL CITRATE (PF) 100 MCG/2ML IJ SOLN
INTRAMUSCULAR | Status: AC
  Filled 2016-01-11: qty 2

## 2016-01-11 SURGICAL SUPPLY — 34 items
CANISTER SUCT 3000ML (MISCELLANEOUS) ×3 IMPLANT
CHLORAPREP W/TINT 26ML (MISCELLANEOUS) ×3 IMPLANT
CUTTER FLEX LINEAR 45M (STAPLE) ×3 IMPLANT
DRSG TEGADERM 2-3/8X2-3/4 SM (GAUZE/BANDAGES/DRESSINGS) ×3 IMPLANT
DRSG TELFA 3X8 NADH (GAUZE/BANDAGES/DRESSINGS) ×3 IMPLANT
ELECT REM PT RETURN 9FT ADLT (ELECTROSURGICAL) ×3
ELECTRODE REM PT RTRN 9FT ADLT (ELECTROSURGICAL) ×1 IMPLANT
GLOVE BIO SURGEON STRL SZ7.5 (GLOVE) ×9 IMPLANT
GLOVE INDICATOR 8.0 STRL GRN (GLOVE) ×3 IMPLANT
GOWN STRL REUS W/ TWL LRG LVL3 (GOWN DISPOSABLE) ×2 IMPLANT
GOWN STRL REUS W/TWL LRG LVL3 (GOWN DISPOSABLE) ×4
GRASPER SUT TROCAR 14GX15 (MISCELLANEOUS) ×3 IMPLANT
IRRIGATION STRYKERFLOW (MISCELLANEOUS) ×1 IMPLANT
IRRIGATOR STRYKERFLOW (MISCELLANEOUS) ×3
IV NS 1000ML (IV SOLUTION) ×6
IV NS 1000ML BAXH (IV SOLUTION) ×3 IMPLANT
KIT RM TURNOVER STRD PROC AR (KITS) ×3 IMPLANT
NEEDLE FILTER BLUNT 18X 1/2SAF (NEEDLE) ×2
NEEDLE FILTER BLUNT 18X1 1/2 (NEEDLE) ×1 IMPLANT
NEEDLE HYPO 25X1 1.5 SAFETY (NEEDLE) ×3 IMPLANT
NEEDLE INSUFFLATION 14GA 120MM (NEEDLE) ×3 IMPLANT
NS IRRIG 500ML POUR BTL (IV SOLUTION) ×3 IMPLANT
PACK LAP CHOLECYSTECTOMY (MISCELLANEOUS) ×3 IMPLANT
POUCH ENDO CATCH 10MM SPEC (MISCELLANEOUS) ×3 IMPLANT
RELOAD 45 VASCULAR/THIN (ENDOMECHANICALS) ×3 IMPLANT
SCISSORS METZENBAUM CVD 33 (INSTRUMENTS) ×3 IMPLANT
SUT ETHILON 5-0 FS-2 18 BLK (SUTURE) ×6 IMPLANT
SUT VIC AB 0 CT2 27 (SUTURE) ×3 IMPLANT
SYRINGE 10CC LL (SYRINGE) ×3 IMPLANT
TRAY FOLEY W/METER SILVER 16FR (SET/KITS/TRAYS/PACK) ×3 IMPLANT
TROCAR XCEL 12X100 BLDLESS (ENDOMECHANICALS) ×3 IMPLANT
TROCAR Z-THREAD FIOS 11X100 BL (TROCAR) ×3 IMPLANT
TROCAR Z-THREAD SLEEVE 11X100 (TROCAR) ×3 IMPLANT
TUBING INSUFFLATOR HI FLOW (MISCELLANEOUS) ×3 IMPLANT

## 2016-01-11 NOTE — Op Note (Signed)
01/10/2016 - 01/11/2016  12:56 PM  PATIENT:  Diana Lester  82 y.o. female  PRE-OPERATIVE DIAGNOSIS:  appendicitis,needs liver biopsy   POST-OPERATIVE DIAGNOSIS:  * No post-op diagnosis entered *  PROCEDURE:  Procedure(s): APPENDECTOMY LAPAROSCOPIC (N/A)  SURGEON:  Surgeon(s) and Role:    * Dia Crawford III, MD - Primary   ASSISTANTS: none   ANESTHESIA:   general  EBL:  Total I/O In: 700 [I.V.:700] Out: -    DRAINS: none   LOCAL MEDICATIONS USED:  MARCAINE      DISPOSITION OF SPECIMEN:  PATHOLOGY   DICTATION: .Dragon Dictation with the patient in supine position and after induction of appropriate general anesthesia the patient's abdomen was prepped ChloraPrep and draped sterile towels. The patient was placed headdown feet up position. Small infraumbilical incision was made standard fashion carried down bluntly through the subcutaneous tissue. A varies needle was used cannulate peritoneal cavity. CO2 was insufflated to appropriate pressure measurements. When approximately 2 L of CO2 were instilled a varies needle was withdrawn and 11 mm port placed in intraperitoneal position. Intraperitoneal position was confirmed and CO2 was reinsufflated. A midepigastric transverse incision was made 11 mm port inserted under direct vision. Suprapubic transverse incision was made and a 12 mm port inserted under direct vision. Appendix was identified. It appeared to have ruptured in the lateral aspect and was stuck down to the pelvic sidewall. Appendix appeared to be inflamed and ischemic all the way to the base. The base of the appendix was divided across a rim of cecum laterally using 3 applications of the Endo GIA stapler carrying a blue load. Mesoappendix divided with 2 applications of Endo GIA stapler carrying a white load. Appendix was captured Endo Catch apparatus removed without difficulty. The area was copiously suction irrigated. The liver was then identified. On the left lobe edge the did  appear to be an abnormal mass. A wedge biopsy was performed by inserting a 5 mm port and right upper quadrant and taking a small piece of tissue off the edge. Hemostasis was achieved with Bovie cautery. The specimen was sent for permanent section.  The area was then copiously irrigated. All ports withdrawn without difficulty. Midline fascia was closed with figure-of-eight sutures 0 Vicryl and skin was closed with 5-0 nylon. The area was infiltrated with 0.25% Marcaine for postoperative pain control. Sterile dressings were applied. Patient returned recovery room having tolerated the procedure well. Sponge instrument needle count were correct 2 in the operating room.  PLAN OF CARE: Admit to inpatient   PATIENT DISPOSITION:  PACU - hemodynamically stable.   Dia Crawford III, MD

## 2016-01-11 NOTE — Anesthesia Procedure Notes (Signed)
Procedure Name: Intubation Performed by: Vaughan Sine Pre-anesthesia Checklist: Patient identified, Emergency Drugs available, Suction available, Patient being monitored and Timeout performed Patient Re-evaluated:Patient Re-evaluated prior to inductionPreoxygenation: Pre-oxygenation with 100% oxygen Intubation Type: IV induction and Cricoid Pressure applied Ventilation: Mask ventilation without difficulty Laryngoscope Size: Miller and 3 Grade View: Grade II Tube type: Oral Tube size: 6.5 mm Number of attempts: 1 Airway Equipment and Method: Stylet Placement Confirmation: ETT inserted through vocal cords under direct vision,  positive ETCO2,  CO2 detector and breath sounds checked- equal and bilateral

## 2016-01-11 NOTE — Anesthesia Preprocedure Evaluation (Addendum)
Anesthesia Evaluation  Patient identified by MRN, date of birth, ID band Patient awake    Reviewed: Allergy & Precautions, NPO status , Patient's Chart, lab work & pertinent test results  Airway Mallampati: II  TM Distance: >3 FB     Dental  (+) Chipped, Caps   Pulmonary former smoker,    Pulmonary exam normal        Cardiovascular hypertension, Pt. on medications Normal cardiovascular exam     Neuro/Psych PSYCHIATRIC DISORDERS Anxiety Depression    GI/Hepatic negative GI ROS, Neg liver ROS,   Endo/Other  negative endocrine ROS  Renal/GU Renal InsufficiencyRenal disease  negative genitourinary   Musculoskeletal  (+) Arthritis , Osteoarthritis,  Spinal stenosis   Abdominal (+)  Abdomen: tender.    Peds negative pediatric ROS (+)  Hematology negative hematology ROS (+)   Anesthesia Other Findings   Reproductive/Obstetrics                            Anesthesia Physical Anesthesia Plan  ASA: II and emergent  Anesthesia Plan: General   Post-op Pain Management:    Induction: Intravenous  Airway Management Planned: Oral ETT  Additional Equipment:   Intra-op Plan:   Post-operative Plan: Extubation in OR  Informed Consent: I have reviewed the patients History and Physical, chart, labs and discussed the procedure including the risks, benefits and alternatives for the proposed anesthesia with the patient or authorized representative who has indicated his/her understanding and acceptance.   Dental advisory given  Plan Discussed with: CRNA and Surgeon  Anesthesia Plan Comments:         Anesthesia Quick Evaluation

## 2016-01-11 NOTE — Anesthesia Postprocedure Evaluation (Signed)
Anesthesia Post Note  Patient: Diana Lester  Procedure(s) Performed: Procedure(s) (LRB): APPENDECTOMY LAPAROSCOPIC (N/A)  Patient location during evaluation: PACU Anesthesia Type: General Level of consciousness: awake and alert and oriented Pain management: pain level controlled Vital Signs Assessment: post-procedure vital signs reviewed and stable Respiratory status: spontaneous breathing Cardiovascular status: blood pressure returned to baseline Anesthetic complications: no    Last Vitals:  Vitals:   01/11/16 1600 01/11/16 1729  BP: 139/74 (!) 154/67  Pulse: (!) 111 (!) 107  Resp: 20 17  Temp: 37.4 C (!) 38.4 C    Last Pain:  Vitals:   01/11/16 1729  TempSrc: Oral  PainSc:                  Harout Scheurich

## 2016-01-11 NOTE — Progress Notes (Signed)
Dr. Pat Patrick notified of puffiness of abdomen, tachycardia, and no urine output after several voiding attempts. MD order to place foley and obtain stat hemoglobin.

## 2016-01-11 NOTE — Progress Notes (Signed)
Subjective:   She continues to have marked abdominal pain with no real improvement over the last 24 hours. She was febrile last night. White blood cell count has increased. She was confused last night but appears appropriate and oriented small morning. Her sister is present with her and confirms that. Her abdomen is markedly tender with rebound guarding right lower quadrant. She has hypoactive bowel sounds.  Vital signs in last 24 hours: Temp:  [98.4 F (36.9 C)-101.2 F (38.4 C)] 98.4 F (36.9 C) (11/05 0525) Pulse Rate:  [81-95] 81 (11/05 0525) Resp:  [18] 18 (11/05 0525) BP: (110-115)/(34-54) 115/46 (11/05 0525) SpO2:  [94 %-99 %] 99 % (11/05 0525) Last BM Date: 01/08/16  Intake/Output from previous day: 11/04 0701 - 11/05 0700 In: 2692.3 [I.V.:2621.8; IV Piggyback:70.5] Out: -   Exam:  See above  Lab Results:  CBC  Recent Labs  01/10/16 0121 01/11/16 0458  WBC 13.7* 19.0*  HGB 14.7 13.1  HCT 42.8 39.2  PLT 217 179   CMP     Component Value Date/Time   NA 135 01/11/2016 0458   K 4.1 01/11/2016 0458   CL 105 01/11/2016 0458   CO2 26 01/11/2016 0458   GLUCOSE 136 (H) 01/11/2016 0458   BUN 17 01/11/2016 0458   CREATININE 1.75 (H) 01/11/2016 0458   CALCIUM 8.5 (L) 01/11/2016 0458   PROT 6.6 01/10/2016 0121   ALBUMIN 4.1 01/10/2016 0121   AST 28 01/10/2016 0121   ALT 17 01/10/2016 0121   ALKPHOS 58 01/10/2016 0121   BILITOT 0.8 01/10/2016 0121   GFRNONAA 26 (L) 01/11/2016 0458   GFRAA 30 (L) 01/11/2016 0458   PT/INR No results for input(s): LABPROT, INR in the last 72 hours.  Studies/Results: Ct Abdomen Pelvis Wo Contrast  Result Date: 01/10/2016 CLINICAL DATA:  Right lower quadrant pain onset this evening. Febrile. EXAM: CT ABDOMEN AND PELVIS WITHOUT CONTRAST TECHNIQUE: Multidetector CT imaging of the abdomen and pelvis was performed following the standard protocol without IV contrast. COMPARISON:  None. FINDINGS: Lower chest: Noncalcified nodules in the  right lung base, at least 3 in number, measuring 3-5 mm. These are visible on images 5 and 7, series 3. Hepatobiliary: Multiple solid liver masses measuring from 0.8-2.6 cm. Suspicious for hepatic hematogenous metastases. Gallbladder and bile ducts are unremarkable. Pancreas: Unremarkable. No pancreatic ductal dilatation or surrounding inflammatory changes. Spleen: Normal in size without focal abnormality. Adrenals/Urinary Tract: Adrenal glands are unremarkable. Kidneys are normal, without renal calculi, focal lesion, or hydronephrosis. Bladder is unremarkable. Stomach/Bowel: Stomach and small bowel are unremarkable. There is acute inflammation surrounding the tip of the cecum, as well as the appendix. This likely represents acute acute appendicitis. There also is diverticular disease of the cecum and ascending colon but the inflammation is not centered on a diverticulum. No extraluminal air or drainable fluid collection. Remainder of the colon is remarkable only for extensive diverticulosis. Vascular/Lymphatic: Aortic atherosclerosis. No enlarged abdominal or pelvic lymph nodes. Reproductive: Uterus and bilateral adnexa are unremarkable. Other: No ascites. Musculoskeletal: No significant skeletal lesions. IMPRESSION: 1. Acute inflammatory process surrounding the cecal tip, likely appendicitis. 2. Multiple solid liver lesions, suspicious for hematogenous metastases. Recommend CT or MRI with contrast. 3. Indeterminate noncalcified nodules in the right lung base. 4. Colonic diverticulosis These results were called by telephone at the time of interpretation on 01/10/2016 at 5:27 am to Dr. Marjean Donna , who verbally acknowledged these results. Electronically Signed   By: Andreas Newport M.D.   On: 01/10/2016 05:33  Dg Chest 2 View  Result Date: 01/10/2016 CLINICAL DATA:  Right lower quadrant pain since evening. EXAM: CHEST  2 VIEW COMPARISON:  04/07/2010 FINDINGS: Mild unchanged hyperinflation. Minimal linear  scarring in the bases. The lungs are otherwise clear. Hilar, mediastinal and cardiac contours are normal and unchanged. Pulmonary vasculature is normal. No pleural effusions. IMPRESSION: Hyperinflation, unchanged.  No acute findings are evident. Electronically Signed   By: Andreas Newport M.D.   On: 01/10/2016 01:47    Assessment/Plan: We will plan to go to surgery this morning for laparoscopic evaluation probable appendectomy. It is possible that with her metastatic disease the abdominal pain is related to another cause but we will anticipate the working diagnosis as acute appendicitis with possible rupture. If feasible we will attempt a liver biopsy at the same time. This plans been discussed with the patient in detail and she is in agreement

## 2016-01-11 NOTE — Progress Notes (Signed)
Called Dr. Adonis Huguenin in regards to BP 111/34. No new orders at this time.

## 2016-01-11 NOTE — Transfer of Care (Signed)
Immediate Anesthesia Transfer of Care Note  Patient: Diana Lester  Procedure(s) Performed: Procedure(s): APPENDECTOMY LAPAROSCOPIC (N/A)  Patient Location: PACU  Anesthesia Type:General  Level of Consciousness: awake and sedated  Airway & Oxygen Therapy: Patient Spontanous Breathing and Patient connected to face mask oxygen  Post-op Assessment: Report given to RN and Post -op Vital signs reviewed and stable  Post vital signs: Reviewed and stable  Last Vitals:  Vitals:   01/11/16 0044 01/11/16 0525  BP: (!) 110/34 (!) 115/46  Pulse:  81  Resp:  18  Temp:  36.9 C    Last Pain:  Vitals:   01/11/16 0834  TempSrc:   PainSc: 4       Patients Stated Pain Goal: 0 ([id number])  Complications: No apparent anesthesia complications

## 2016-01-12 ENCOUNTER — Encounter: Payer: Self-pay | Admitting: Surgery

## 2016-01-12 LAB — BASIC METABOLIC PANEL
ANION GAP: 6 (ref 5–15)
BUN: 15 mg/dL (ref 6–20)
CO2: 23 mmol/L (ref 22–32)
Calcium: 8.4 mg/dL — ABNORMAL LOW (ref 8.9–10.3)
Chloride: 109 mmol/L (ref 101–111)
Creatinine, Ser: 1.3 mg/dL — ABNORMAL HIGH (ref 0.44–1.00)
GFR calc Af Amer: 43 mL/min — ABNORMAL LOW (ref >60)
GFR, EST NON AFRICAN AMERICAN: 37 mL/min — AB (ref >60)
Glucose, Bld: 176 mg/dL — ABNORMAL HIGH (ref 65–99)
POTASSIUM: 4.2 mmol/L (ref 3.5–5.1)
SODIUM: 138 mmol/L (ref 135–145)

## 2016-01-12 LAB — CBC
HCT: 34.9 % — ABNORMAL LOW (ref 35.0–47.0)
Hemoglobin: 12 g/dL (ref 12.0–16.0)
MCH: 30 pg (ref 26.0–34.0)
MCHC: 34.5 g/dL (ref 32.0–36.0)
MCV: 87 fL (ref 80.0–100.0)
PLATELETS: 169 10*3/uL (ref 150–440)
RBC: 4.01 MIL/uL (ref 3.80–5.20)
RDW: 14 % (ref 11.5–14.5)
WBC: 18 10*3/uL — ABNORMAL HIGH (ref 3.6–11.0)

## 2016-01-12 MED ORDER — ACETAMINOPHEN 325 MG PO TABS
650.0000 mg | ORAL_TABLET | Freq: Four times a day (QID) | ORAL | Status: DC
  Administered 2016-01-12 – 2016-01-18 (×24): 650 mg via ORAL
  Filled 2016-01-12 (×26): qty 2

## 2016-01-12 MED ORDER — KETOROLAC TROMETHAMINE 15 MG/ML IJ SOLN
15.0000 mg | Freq: Four times a day (QID) | INTRAMUSCULAR | Status: AC
  Administered 2016-01-12 – 2016-01-17 (×20): 15 mg via INTRAVENOUS
  Filled 2016-01-12 (×21): qty 1

## 2016-01-12 MED ORDER — PANTOPRAZOLE SODIUM 40 MG PO TBEC
40.0000 mg | DELAYED_RELEASE_TABLET | Freq: Every day | ORAL | Status: DC
  Administered 2016-01-12: 40 mg via ORAL
  Filled 2016-01-12: qty 1

## 2016-01-12 MED ORDER — CALCIUM CARBONATE ANTACID 500 MG PO CHEW
1.0000 | CHEWABLE_TABLET | Freq: Three times a day (TID) | ORAL | Status: DC
  Administered 2016-01-13 – 2016-01-18 (×17): 200 mg via ORAL
  Filled 2016-01-12 (×17): qty 1

## 2016-01-12 MED ORDER — PANTOPRAZOLE SODIUM 40 MG PO TBEC
40.0000 mg | DELAYED_RELEASE_TABLET | Freq: Two times a day (BID) | ORAL | Status: DC
  Administered 2016-01-12 – 2016-01-18 (×12): 40 mg via ORAL
  Filled 2016-01-12 (×12): qty 1

## 2016-01-12 MED ORDER — MORPHINE SULFATE (PF) 2 MG/ML IV SOLN: 2.0000 mg | INTRAVENOUS | Status: DC | PRN

## 2016-01-12 MED ORDER — ALUM & MAG HYDROXIDE-SIMETH 200-200-20 MG/5ML PO SUSP
30.0000 mL | ORAL | Status: DC | PRN
  Administered 2016-01-12 (×4): 30 mL via ORAL
  Filled 2016-01-12 (×4): qty 30

## 2016-01-12 NOTE — Plan of Care (Signed)
Problem: Physical Regulation: Goal: Postoperative complications will be avoided or minimized Outcome: Progressing Fever resolved with Tylenol and IVFs; slept on/off overnight.

## 2016-01-12 NOTE — Plan of Care (Signed)
Problem: Pain Managment: Goal: General experience of comfort will improve Outcome: Progressing Minimal pain med given overnight;

## 2016-01-12 NOTE — Progress Notes (Signed)
Foley removed per MD order.  

## 2016-01-12 NOTE — Progress Notes (Signed)
82 year old female with ruptured appendicitis and liver tumors POD#1 from lap appendectomy and liver biopsy. Patient has been sitting up in the chair and states her back hurts more now. Patient willing to ambulate in hallway.  Patient states that the Foley catheter is irritating her. Patient has had some nausea after taking in the liquids. She is also complained of reflux.  Vitals:   01/12/16 0801 01/12/16 1300  BP: (!) 138/58 131/60  Pulse: 83 82  Resp: 18 19  Temp: 98.5 F (36.9 C) 98.1 F (36.7 C)   I/O last 3 completed shifts: In: 3918 [I.V.:3836; IV Piggyback:82] Out: 2075 [Urine:1725; Emesis/NG output:300; Blood:50] Total I/O In: 23 [P.O.:60] Out: -    PE:  Gen: NAD Abd: soft, moderately distended, appropriately tender, incisions c/d/i  Ext: no edema  CBC Latest Ref Rng & Units 01/12/2016 01/11/2016 01/11/2016  WBC 3.6 - 11.0 K/uL 18.0(H) - 19.0(H)  Hemoglobin 12.0 - 16.0 g/dL 12.0 14.1 13.1  Hematocrit 35.0 - 47.0 % 34.9(L) - 39.2  Platelets 150 - 440 K/uL 169 - 179   CMP Latest Ref Rng & Units 01/12/2016 01/11/2016 01/10/2016  Glucose 65 - 99 mg/dL 176(H) 136(H) 166(H)  BUN 6 - 20 mg/dL 15 17 23(H)  Creatinine 0.44 - 1.00 mg/dL 1.30(H) 1.75(H) 1.52(H)  Sodium 135 - 145 mmol/L 138 135 135  Potassium 3.5 - 5.1 mmol/L 4.2 4.1 4.0  Chloride 101 - 111 mmol/L 109 105 101  CO2 22 - 32 mmol/L 23 26 25   Calcium 8.9 - 10.3 mg/dL 8.4(L) 8.5(L) 9.7  Total Protein 6.5 - 8.1 g/dL - - 6.6  Total Bilirubin 0.3 - 1.2 mg/dL - - 0.8  Alkaline Phos 38 - 126 U/L - - 58  AST 15 - 41 U/L - - 28  ALT 14 - 54 U/L - - 17    A/P:  82 year old female with ruptured appendicitis and liver tumors POD#1 from lap appendectomy and liver biopsy.  Pain: Scheduled by mouth Tylenol, IV Toradol and continued IV morphine as needed GI: Continue clear liquid diet, Protonix by mouth twice a day, Maalox and Tums when necessary for acid reflux, continue Zosyn for peritonitis Encourage ambulation

## 2016-01-12 NOTE — Plan of Care (Signed)
Problem: Pain Managment: Goal: General experience of comfort will improve Outcome: Progressing Pt is only needing scheduled pain medications.   Problem: Activity: Goal: Risk for activity intolerance will decrease Outcome: Progressing Pt has ambulated today in the hallway.

## 2016-01-12 NOTE — Progress Notes (Signed)
Patient given splint pillow for abdomen while coughing; instructed pt and daughter-in-law on use; voiced understanding. Barbaraann Faster, RN5:54 AM 01/12/2016

## 2016-01-12 NOTE — Care Management Important Message (Signed)
Important Message  Patient Details  Name: Diana Lester MRN: [id number] Date of Birth: Jul 11, 1930   Medicare Important Message Given:  Yes    Beverly Sessions, RN 01/12/2016, 11:21 AM

## 2016-01-13 ENCOUNTER — Telehealth: Payer: Self-pay | Admitting: General Surgery

## 2016-01-13 LAB — CBC
HCT: 35.5 % (ref 35.0–47.0)
HEMOGLOBIN: 11.8 g/dL — AB (ref 12.0–16.0)
MCH: 29.3 pg (ref 26.0–34.0)
MCHC: 33.2 g/dL (ref 32.0–36.0)
MCV: 88.3 fL (ref 80.0–100.0)
PLATELETS: 199 10*3/uL (ref 150–440)
RBC: 4.02 MIL/uL (ref 3.80–5.20)
RDW: 13.8 % (ref 11.5–14.5)
WBC: 14.3 10*3/uL — ABNORMAL HIGH (ref 3.6–11.0)

## 2016-01-13 MED ORDER — OXYCODONE HCL 5 MG PO TABS
5.0000 mg | ORAL_TABLET | ORAL | Status: DC | PRN
  Administered 2016-01-13: 5 mg via ORAL
  Administered 2016-01-14 – 2016-01-15 (×4): 10 mg via ORAL
  Administered 2016-01-17: 5 mg via ORAL
  Administered 2016-01-17 – 2016-01-18 (×2): 10 mg via ORAL
  Administered 2016-01-18: 5 mg via ORAL
  Filled 2016-01-13: qty 2
  Filled 2016-01-13: qty 1
  Filled 2016-01-13 (×4): qty 2
  Filled 2016-01-13: qty 1
  Filled 2016-01-13: qty 2
  Filled 2016-01-13: qty 1

## 2016-01-13 MED ORDER — MENTHOL 3 MG MT LOZG
1.0000 | LOZENGE | OROMUCOSAL | Status: DC | PRN
  Administered 2016-01-13: 3 mg via ORAL
  Filled 2016-01-13 (×2): qty 9

## 2016-01-13 MED ORDER — MORPHINE SULFATE (PF) 4 MG/ML IV SOLN
2.0000 mg | INTRAVENOUS | Status: DC | PRN
Start: 2016-01-13 — End: 2016-01-17
  Administered 2016-01-14: 2 mg via INTRAVENOUS
  Filled 2016-01-13: qty 1

## 2016-01-13 MED ORDER — LOPERAMIDE HCL 2 MG PO CAPS
2.0000 mg | ORAL_CAPSULE | Freq: Two times a day (BID) | ORAL | Status: DC
  Administered 2016-01-13: 2 mg via ORAL
  Filled 2016-01-13: qty 1

## 2016-01-13 NOTE — Progress Notes (Signed)
82 year old female with ruptured appendicitis and liver tumors POD#2 from lap appendectomy and liver biopsy. Patient has been up and sitting in chair today.  She has not felt well today and has been having some diarrhea.  She also has had a sensation of urinary urgency and unsure if she is peeing when she is having the diarrhea.  She states pain in lower abdomen.   Vitals:   01/13/16 1130 01/13/16 1401  BP: 139/63 (!) 143/71  Pulse: 77 74  Resp: 18 18  Temp: 98.1 F (36.7 C) 99 F (37.2 C)   I/O last 3 completed shifts: In: 5136 [P.O.:240; I.V.:4731; IV Piggyback:165] Out: 4580 [Urine:1225; Emesis/NG output:300] Total I/O In: 9983 [I.V.:1085; IV Piggyback:30] Out: -    PE:  Gen: NAD Abd: soft, moderately distended, appropriately tender, incisions c/d/i  Ext: no edema  CBC Latest Ref Rng & Units 01/13/2016 01/12/2016 01/11/2016  WBC 3.6 - 11.0 K/uL 14.3(H) 18.0(H) -  Hemoglobin 12.0 - 16.0 g/dL 11.8(L) 12.0 14.1  Hematocrit 35.0 - 47.0 % 35.5 34.9(L) -  Platelets 150 - 440 K/uL 199 169 -   CMP Latest Ref Rng & Units 01/12/2016 01/11/2016 01/10/2016  Glucose 65 - 99 mg/dL 176(H) 136(H) 166(H)  BUN 6 - 20 mg/dL 15 17 23(H)  Creatinine 0.44 - 1.00 mg/dL 1.30(H) 1.75(H) 1.52(H)  Sodium 135 - 145 mmol/L 138 135 135  Potassium 3.5 - 5.1 mmol/L 4.2 4.1 4.0  Chloride 101 - 111 mmol/L 109 105 101  CO2 22 - 32 mmol/L 23 26 25   Calcium 8.9 - 10.3 mg/dL 8.4(L) 8.5(L) 9.7  Total Protein 6.5 - 8.1 g/dL - - 6.6  Total Bilirubin 0.3 - 1.2 mg/dL - - 0.8  Alkaline Phos 38 - 126 U/L - - 58  AST 15 - 41 U/L - - 28  ALT 14 - 54 U/L - - 17    A/P:  82 year old female with ruptured appendicitis and liver tumors POD#2 from lap appendectomy and liver biopsy.  Pain: Scheduled by mouth Tylenol, IV Toradol and continued IV morphine as needed, added po oxycodone prn as well GI: Continue clear liquid diet, Protonix by mouth twice a day, Maalox and Tums when necessary for acid reflux, continue Zosyn for  peritonitis Encourage ambulation

## 2016-01-13 NOTE — Progress Notes (Signed)
MD notified of pt's c/o urgency with urination. Per pt, she is not sure if she has been peeing because she is also having loose BM's. Pt sat on toilet and had a loose BM. RN bladder scanned pt and got a total of 39ml. MD notified and no new orders were given.

## 2016-01-13 NOTE — Telephone Encounter (Signed)
Diana Lester, sister, would like to get the pathology report in case it is cancer. She is on the [facility]. She doesn't want her sister to hear the news over the phone in case it wasn't good. [phone]

## 2016-01-13 NOTE — Telephone Encounter (Signed)
Patient's sister Jana Half) was informed that pathology was not back yet. But as soon as we receive it, then I would call her. Jana Half understood and had no further questions.

## 2016-01-13 NOTE — Progress Notes (Signed)
Dr. Hampton Abbot notified of patients frequent loose/water stools (7-9); patient negative for C.diff; acknowledged; new order written for Imodium. Barbaraann Faster, RN 1:34 AM 01/13/2016

## 2016-01-14 LAB — CBC
HCT: 31.8 % — ABNORMAL LOW (ref 35.0–47.0)
HEMOGLOBIN: 10.7 g/dL — AB (ref 12.0–16.0)
MCH: 29.5 pg (ref 26.0–34.0)
MCHC: 33.7 g/dL (ref 32.0–36.0)
MCV: 87.5 fL (ref 80.0–100.0)
Platelets: 193 10*3/uL (ref 150–440)
RBC: 3.64 MIL/uL — ABNORMAL LOW (ref 3.80–5.20)
RDW: 13.6 % (ref 11.5–14.5)
WBC: 10 10*3/uL (ref 3.6–11.0)

## 2016-01-14 LAB — BASIC METABOLIC PANEL
Anion gap: 5 (ref 5–15)
BUN: 19 mg/dL (ref 6–20)
CALCIUM: 8.2 mg/dL — AB (ref 8.9–10.3)
CHLORIDE: 109 mmol/L (ref 101–111)
CO2: 24 mmol/L (ref 22–32)
CREATININE: 1.73 mg/dL — AB (ref 0.44–1.00)
GFR calc non Af Amer: 26 mL/min — ABNORMAL LOW (ref >60)
GFR, EST AFRICAN AMERICAN: 30 mL/min — AB (ref >60)
Glucose, Bld: 123 mg/dL — ABNORMAL HIGH (ref 65–99)
Potassium: 4.5 mmol/L (ref 3.5–5.1)
SODIUM: 138 mmol/L (ref 135–145)

## 2016-01-14 NOTE — Plan of Care (Signed)
Problem: Bowel/Gastric: Goal: Will not experience complications related to bowel motility Outcome: Not Progressing Pt continues to have diarrhea.

## 2016-01-14 NOTE — Progress Notes (Signed)
01/14/2016  Subjective: Patient is 3 Days Post-Op s/p laparoscopic appendectomy.  Her WBC today continues to improve but the patient had more soreness and feeling worse today during the daytime.  Tonight, she feels better and denies any nausea or abdominal pain.  Has tolerated small amounts of full liquid diet.  Reports the smell of the broth makes her nauseous.  Vital signs: Temp:  [98.2 F (36.8 C)-98.6 F (37 C)] 98.6 F (37 C) (11/08 2029) Pulse Rate:  [68-72] 70 (11/08 2029) Resp:  [17-19] 19 (11/08 2029) BP: (151-159)/(65-70) 159/68 (11/08 2029) SpO2:  [98 %-99 %] 98 % (11/08 2029)   Intake/Output: 11/07 0701 - 11/08 0700 In: 2777.3 [P.O.:360; I.V.:2316; IV Piggyback:101.3] Out: 2 [Urine:1; Stool:1] Last BM Date: 01/13/16  Physical Exam: Constitutional:  No acute distress Abdomen: soft, less distended, appropriately tender to palpation.  Incisions c/d/i.  Labs:   Recent Labs  01/13/16 0436 01/14/16 0422  WBC 14.3* 10.0  HGB 11.8* 10.7*  HCT 35.5 31.8*  PLT 199 193    Recent Labs  01/12/16 0610 01/14/16 0422  NA 138 138  K 4.2 4.5  CL 109 109  CO2 23 24  GLUCOSE 176* 123*  BUN 15 19  CREATININE 1.30* 1.73*  CALCIUM 8.4* 8.2*   No results for input(s): LABPROT, INR in the last 72 hours.  Imaging: No results found.  Assessment/Plan: 82 yo female s/p laparoscopic appendectomy.  --Continue full liquid diet.  Possible advance tomorrow depending on how patient feels. --Repeat CBC tomorrow. --Continue IV Zosyn, maalox/tums for reflux, and pain meds.   Melvyn Neth, [place]

## 2016-01-15 LAB — CULTURE, BLOOD (ROUTINE X 2)
Culture: NO GROWTH
Culture: NO GROWTH

## 2016-01-15 LAB — CBC WITH DIFFERENTIAL/PLATELET
BASOS ABS: 0.1 10*3/uL (ref 0–0.1)
BASOS PCT: 1 %
EOS PCT: 5 %
Eosinophils Absolute: 0.5 10*3/uL (ref 0–0.7)
HCT: 35.2 % (ref 35.0–47.0)
Hemoglobin: 12 g/dL (ref 12.0–16.0)
LYMPHS PCT: 11 %
Lymphs Abs: 1.1 10*3/uL (ref 1.0–3.6)
MCH: 29.6 pg (ref 26.0–34.0)
MCHC: 34 g/dL (ref 32.0–36.0)
MCV: 87.2 fL (ref 80.0–100.0)
MONO ABS: 1.2 10*3/uL — AB (ref 0.2–0.9)
Monocytes Relative: 11 %
NEUTROS ABS: 7.7 10*3/uL — AB (ref 1.4–6.5)
Neutrophils Relative %: 72 %
PLATELETS: 241 10*3/uL (ref 150–440)
RBC: 4.04 MIL/uL (ref 3.80–5.20)
RDW: 13.8 % (ref 11.5–14.5)
WBC: 10.6 10*3/uL (ref 3.6–11.0)

## 2016-01-15 NOTE — Progress Notes (Signed)
82 year old female with ruptured appendicitis and liver tumors POD#4 from lap appendectomy and liver biopsy. Patient has been up and sitting in chair today.  She toelrated full liquids well.  She has not had any nausea today.  SHe states pain well controlled except lower pain in the very early AM.   Vitals:   01/15/16 0857 01/15/16 1425  BP: (!) 159/71 (!) 157/71  Pulse: 69 70  Resp:  17  Temp: 98.1 F (36.7 C) 99.1 F (37.3 C)   I/O last 3 completed shifts: In: 4200 [P.O.:355; I.V.:3724; IV Piggyback:121] Out: 1660 [Urine:1660] No intake/output data recorded.   PE:  Gen: NAD Abd: soft, moderately distended, appropriately tender, incisions c/d/i  Ext: no edema  CBC Latest Ref Rng & Units 01/15/2016 01/14/2016 01/13/2016  WBC 3.6 - 11.0 K/uL 10.6 10.0 14.3(H)  Hemoglobin 12.0 - 16.0 g/dL 12.0 10.7(L) 11.8(L)  Hematocrit 35.0 - 47.0 % 35.2 31.8(L) 35.5  Platelets 150 - 440 K/uL 241 193 199   CMP Latest Ref Rng & Units 01/14/2016 01/12/2016 01/11/2016  Glucose 65 - 99 mg/dL 123(H) 176(H) 136(H)  BUN 6 - 20 mg/dL 19 15 17   Creatinine 0.44 - 1.00 mg/dL 1.73(H) 1.30(H) 1.75(H)  Sodium 135 - 145 mmol/L 138 138 135  Potassium 3.5 - 5.1 mmol/L 4.5 4.2 4.1  Chloride 101 - 111 mmol/L 109 109 105  CO2 22 - 32 mmol/L 24 23 26   Calcium 8.9 - 10.3 mg/dL 8.2(L) 8.4(L) 8.5(L)  Total Protein 6.5 - 8.1 g/dL - - -  Total Bilirubin 0.3 - 1.2 mg/dL - - -  Alkaline Phos 38 - 126 U/L - - -  AST 15 - 41 U/L - - -  ALT 14 - 54 U/L - - -    A/P:  82 year old female with ruptured appendicitis and liver tumors POD#58from lap appendectomy and liver biopsy.  Pain: Scheduled by mouth Tylenol, IV Toradol and continued IV morphine as needed, added po oxycodone prn as well GI: Advance to regular diet,  Protonix by mouth twice a day, Maalox and Tums when necessary for acid reflux, continue Zosyn for peritonitis Encourage ambulation

## 2016-01-15 NOTE — Telephone Encounter (Signed)
Called Jana Half (patient's sister) to let her know that I called our pathology department and asked if by any chance they had pathology results. I was told that it was still under review. I told Jana Half that as soon as I get results, that I will call her to let her know. Jana Half understood and had no further questions.

## 2016-01-16 NOTE — Care Management Important Message (Signed)
Important Message  Patient Details  Name: Diana Lester MRN: [id number] Date of Birth: [birth year]   Medicare Important Message Given:  Yes    Beverly Sessions, RN 01/16/2016, 2:03 PM

## 2016-01-16 NOTE — Progress Notes (Signed)
Notified Dr. Azalee Course that patient has had several loose stools today. Per MD do not check for C Diff as patient has come back negative previously in her stay.

## 2016-01-16 NOTE — Evaluation (Signed)
Physical Therapy Evaluation Patient Details Name: Diana Lester MRN: [id number] DOB: Dec 19, 1930 Today's Date: 01/16/2016   History of Present Illness  Pt underwent laproscopic appendectomy and liver biposy for s/p appendix rupture. She is POD#5 at time of PT evaluation. No reported complications post-operatively noted.   Clinical Impression  Pt admitted with above diagnosis. Pt currently with functional limitations due to the deficits listed below (see PT Problem List). Pt demonstrates general deconditioning s/p laparoscopic appendectomy with extended hospital admission. She has been ambulating out in the hallways since surgery and has performed up to 2 laps around the RN station in one session. She is generally stable today with therapist but does appear more confident with UE support. Pt to check with family about availability but if she does not have access would recommend she get a rolling walker for additional safety. Pt may also benefit from a shower chair if she does not have access. Pt will benefit from skilled PT services to address deficits in strength, balance, and mobility in order to return to full function at home.     Follow Up Recommendations Home health PT    Equipment Recommendations  Other (comment) (Possibly RW and shower seat, pt to check with family)    Recommendations for Other Services       Precautions / Restrictions Precautions Precautions: Fall Restrictions Weight Bearing Restrictions: No      Mobility  Bed Mobility Overal bed mobility: Needs Assistance Bed Mobility: Supine to Sit     Supine to sit: Supervision     General bed mobility comments: Pt moves slowly secondary to pain especially when utilizing abdominal muscles. Does not require external assit from therapist  Transfers Overall transfer level: Modified independent Equipment used: None             General transfer comment: Lester sequencing but speed is a little slower due to pain  and LE weakness  Ambulation/Gait Ambulation/Gait assistance: Min guard Ambulation Distance (Feet): 220 Feet Assistive device: Rolling walker (2 wheeled) Gait Pattern/deviations: Step-through pattern Gait velocity: Decreasded but functional for household mobility Gait velocity interpretation: <1.8 ft/sec, indicative of risk for recurrent falls General Gait Details: Pt ambulates with decreased gait speed around RN station. She maintains mild forward trunk flexion seconary to abdominal pain. Pt denies DOE and VSS throughout ambulation. Good scanning of visual environment. Pt reports she prefers some handheld support so rolling walker utilized instead. She is steady and stable with use of rolling walker  Stairs            Wheelchair Mobility    Modified Rankin (Stroke Patients Only)       Balance Overall balance assessment: Needs assistance Sitting-balance support: No upper extremity supported Sitting balance-Leahy Scale: Good     Standing balance support: No upper extremity supported Standing balance-Leahy Scale: Fair Standing balance comment: Positive rhomerg for increased sway. Single leg balance is 3-5 seconds bilaterally. Pt with some LE weakness and prefers UE support during ambulation                             Pertinent Vitals/Pain Pain Location: Pt has difficulty describing but reports she feels "unwell and bloated" her her abdomen. Unable to rate when questioned    Home Living Family/patient expects to be discharged to:: Private residence Living Arrangements: Alone Available Help at Discharge: Family Type of Home: House Home Access: Stairs to enter Entrance Stairs-Rails: None Entrance Stairs-Number of Steps: 1  Home Layout: One level Home Equipment: Grab bars - tub/shower Additional Comments: Pt unsure what she has access to for equipment, believes she has rolling walker and wheelchair    Prior Function Level of Independence: Independent          Comments: Full community ambulator without assistive device. Drives and cares for all ADLs/IADLs independently     Hand Dominance   Dominant Hand: Right    Extremity/Trunk Assessment   Upper Extremity Assessment: Overall WFL for tasks assessed           Lower Extremity Assessment: Generalized weakness         Communication   Communication: No difficulties  Cognition Arousal/Alertness: Awake/alert Behavior During Therapy: WFL for tasks assessed/performed Overall Cognitive Status: Within Functional Limits for tasks assessed                      General Comments      Exercises     Assessment/Plan    PT Assessment Patient needs continued PT services  PT Problem List Decreased strength;Decreased activity tolerance;Decreased balance;Pain          PT Treatment Interventions DME instruction;Gait training;Stair training;Therapeutic activities;Therapeutic exercise;Balance training;Neuromuscular re-education;Patient/family education    PT Goals (Current goals can be found in the Care Plan section)  Acute Rehab PT Goals Patient Stated Goal: Return to prior level of function at home PT Goal Formulation: With patient Time For Goal Achievement: 01/30/16 Potential to Achieve Goals: Good    Frequency Min 2X/week   Barriers to discharge Decreased caregiver support Lives alone but does have a sister that lives nearby and friends in her neighborhood    Co-evaluation               End of Session   Activity Tolerance: Patient tolerated treatment well Patient left: in bed;with call bell/phone within reach;with bed alarm set           Time: 1005-1028 PT Time Calculation (min) (ACUTE ONLY): 23 min   Charges:   PT Evaluation $PT Eval Low Complexity: 1 Procedure PT Treatments $Gait Training: 8-22 mins   PT G Codes:       Diana Lester Diana Lester PT, [name]   Diana Lester 01/16/2016, 10:41 AM

## 2016-01-16 NOTE — Progress Notes (Signed)
82 year old female with ruptured appendicitis and liver tumors POD#5 from lap appendectomy and liver biopsy. Patient has been up and sitting in chair today.  She toelrated regular diet ok but some intermittent nasuea.  She still has some diarrhea as well.  She has been up and working with PT today.   Vitals:   01/16/16 0756 01/16/16 1205  BP: (!) 151/60 (!) 144/71  Pulse: 76 74  Resp: 18 18  Temp: 98.5 F (36.9 C) 98.5 F (36.9 C)   I/O last 3 completed shifts: In: 2353 [P.O.:855; I.V.:630; IV Piggyback:168] Out: 400 [Urine:400] No intake/output data recorded.   PE:  Gen: NAD Abd: soft, moderately distended, appropriately tender, incisions c/d/i  Ext: no edema  CBC Latest Ref Rng & Units 01/15/2016 01/14/2016 01/13/2016  WBC 3.6 - 11.0 K/uL 10.6 10.0 14.3(H)  Hemoglobin 12.0 - 16.0 g/dL 12.0 10.7(L) 11.8(L)  Hematocrit 35.0 - 47.0 % 35.2 31.8(L) 35.5  Platelets 150 - 440 K/uL 241 193 199   CMP Latest Ref Rng & Units 01/14/2016 01/12/2016 01/11/2016  Glucose 65 - 99 mg/dL 123(H) 176(H) 136(H)  BUN 6 - 20 mg/dL 19 15 17   Creatinine 0.44 - 1.00 mg/dL 1.73(H) 1.30(H) 1.75(H)  Sodium 135 - 145 mmol/L 138 138 135  Potassium 3.5 - 5.1 mmol/L 4.5 4.2 4.1  Chloride 101 - 111 mmol/L 109 109 105  CO2 22 - 32 mmol/L 24 23 26   Calcium 8.9 - 10.3 mg/dL 8.2(L) 8.4(L) 8.5(L)  Total Protein 6.5 - 8.1 g/dL - - -  Total Bilirubin 0.3 - 1.2 mg/dL - - -  Alkaline Phos 38 - 126 U/L - - -  AST 15 - 41 U/L - - -  ALT 14 - 54 U/L - - -    A/P:  82 year old female with ruptured appendicitis and liver tumors POD#5 from lap appendectomy and liver biopsy.  Pain: Scheduled by mouth Tylenol, IV Toradol and continued IV morphine as needed, added po oxycodone prn as well GI: continue regular diet,  Protonix by mouth twice a day, Maalox and Tums when necessary for acid reflux, continue Zosyn for peritonitis Encourage ambulation, PT recommending Home health PT Likely d/c home tomorrow

## 2016-01-17 NOTE — Progress Notes (Signed)
82 year old female with ruptured appendicitis and liver tumors POD#6 from lap appendectomy and liver biopsy. Patient has been up walking in hallways with daughter.  She has been tolerating regular diet today without any nausea.  She is having bowel movements but still loose.   Vitals:   01/17/16 0459 01/17/16 1308  BP: (!) 146/61 (!) 152/63  Pulse: 72 71  Resp: 18 (!) 22  Temp: 98.4 F (36.9 C) 98.7 F (37.1 C)   I/O last 3 completed shifts: In: 738 [P.O.:620; IV Piggyback:118] Out: 400 [Urine:400] Total I/O In: 240 [P.O.:240] Out: -    PE:  Gen: NAD Abd: soft, moderately distended, appropriately tender, incisions c/d/i  Ext: no edema  CBC Latest Ref Rng & Units 01/15/2016 01/14/2016 01/13/2016  WBC 3.6 - 11.0 K/uL 10.6 10.0 14.3(H)  Hemoglobin 12.0 - 16.0 g/dL 12.0 10.7(L) 11.8(L)  Hematocrit 35.0 - 47.0 % 35.2 31.8(L) 35.5  Platelets 150 - 440 K/uL 241 193 199   CMP Latest Ref Rng & Units 01/14/2016 01/12/2016 01/11/2016  Glucose 65 - 99 mg/dL 123(H) 176(H) 136(H)  BUN 6 - 20 mg/dL 19 15 17   Creatinine 0.44 - 1.00 mg/dL 1.73(H) 1.30(H) 1.75(H)  Sodium 135 - 145 mmol/L 138 138 135  Potassium 3.5 - 5.1 mmol/L 4.5 4.2 4.1  Chloride 101 - 111 mmol/L 109 109 105  CO2 22 - 32 mmol/L 24 23 26   Calcium 8.9 - 10.3 mg/dL 8.2(L) 8.4(L) 8.5(L)  Total Protein 6.5 - 8.1 g/dL - - -  Total Bilirubin 0.3 - 1.2 mg/dL - - -  Alkaline Phos 38 - 126 U/L - - -  AST 15 - 41 U/L - - -  ALT 14 - 54 U/L - - -    A/P:  82 year old female with ruptured appendicitis and liver tumors POD#6 from lap appendectomy and liver biopsy.  Pain: Scheduled by mouth Tylenol, IV Toradol and continued IV morphine as needed, added po oxycodone prn as well GI: continue regular diet,  Protonix by mouth twice a day, Maalox and Tums when necessary for acid reflux,  Encourage ambulation, PT recommending Home health PT Likely d/c home tomorrow after discussion with daughter

## 2016-01-18 LAB — BASIC METABOLIC PANEL
Anion gap: 8 (ref 5–15)
BUN: 12 mg/dL (ref 6–20)
CALCIUM: 8.8 mg/dL — AB (ref 8.9–10.3)
CO2: 24 mmol/L (ref 22–32)
Chloride: 107 mmol/L (ref 101–111)
Creatinine, Ser: 1.31 mg/dL — ABNORMAL HIGH (ref 0.44–1.00)
GFR calc Af Amer: 43 mL/min — ABNORMAL LOW (ref >60)
GFR, EST NON AFRICAN AMERICAN: 37 mL/min — AB (ref >60)
Glucose, Bld: 95 mg/dL (ref 65–99)
POTASSIUM: 3.9 mmol/L (ref 3.5–5.1)
SODIUM: 139 mmol/L (ref 135–145)

## 2016-01-18 LAB — CBC
HCT: 35 % (ref 35.0–47.0)
Hemoglobin: 11.9 g/dL — ABNORMAL LOW (ref 12.0–16.0)
MCH: 29.6 pg (ref 26.0–34.0)
MCHC: 33.9 g/dL (ref 32.0–36.0)
MCV: 87.2 fL (ref 80.0–100.0)
PLATELETS: 382 10*3/uL (ref 150–440)
RBC: 4.01 MIL/uL (ref 3.80–5.20)
RDW: 14 % (ref 11.5–14.5)
WBC: 13.1 10*3/uL — AB (ref 3.6–11.0)

## 2016-01-18 MED ORDER — IBUPROFEN 800 MG PO TABS: 800.0000 mg | ORAL_TABLET | Freq: Three times a day (TID) | ORAL | 0 refills | Status: DC | PRN

## 2016-01-18 MED ORDER — ACETAMINOPHEN 325 MG PO TABS: 650.0000 mg | ORAL_TABLET | Freq: Four times a day (QID) | ORAL | 0 refills | Status: DC | PRN

## 2016-01-18 MED ORDER — OXYCODONE HCL 5 MG PO TABS: 5.0000 mg | ORAL_TABLET | ORAL | 0 refills | Status: DC | PRN

## 2016-01-18 MED ORDER — IBUPROFEN 200 MG PO TABS: 200.0000 mg | ORAL_TABLET | Freq: Four times a day (QID) | ORAL | 0 refills | Status: DC | PRN

## 2016-01-18 NOTE — Discharge Summary (Signed)
Physician Discharge Summary  [name] MRN: [id number] DOB/AGE: 1930/03/30 82 y.o.  Admit date: 01/10/2016 Discharge date: 01/18/2016  Admission Diagnoses:  Acute ruptured appendicitis  Discharge Diagnoses:  Active Problems:   Abdominal pain   Discharged Condition: good  Hospital Course: 82 yr old female with ruptured appendicitis on 11/5 underwent Lap appy and liver biopsy for suspicious lesions.  She has been doing well postoperatively.  She has been up and walking around and evaluated by physical therapy, who recommended home health physical therapy.   She has been tolerating a regular diet although her diet is poor.  She has been having bowel movements on the diarrhea side but this has improved as well.  Her pain is controlled with tylenol and prn oxycodone  Consults: None  Significant Diagnostic Studies: CT  Treatments: surgery: Lap appy with liver biopsy on 11/5  Discharge Exam: Blood pressure 138/63, pulse 79, temperature 98.1 F (36.7 C), temperature source Oral, resp. rate 18, height 5\' 3"  (1.6 m), weight 141 lb 14.4 oz (64.4 kg), SpO2 97 %. General appearance: alert, cooperative and no distress GI: soft, non-tender, incisions c/d/i, with stitches in place Extremities: extremities normal, atraumatic, no cyanosis or edema  Disposition: 01-Home or Self Care  Discharge Instructions    Call MD for:  difficulty breathing, headache or visual disturbances    Complete by:  As directed    Call MD for:  persistant nausea and vomiting    Complete by:  As directed    Call MD for:  redness, tenderness, or signs of infection (pain, swelling, redness, odor or green/yellow discharge around incision site)    Complete by:  As directed    Call MD for:  severe uncontrolled pain    Complete by:  As directed    Call MD for:  temperature >100.4    Complete by:  As directed    Diet - low sodium heart healthy    Complete by:  As directed    Discharge instructions     Complete by:  As directed    Take tylenol or ibuprofen for mild to moderate pain, can interchange them so you can have one or the other every 3 hours  Take the oxycodone for severe pain only   Driving Restrictions    Complete by:  As directed    No driving while on prescription pain medication   Increase activity slowly    Complete by:  As directed    Lifting restrictions    Complete by:  As directed    No lifting over 15lbs for 2 weeks   May shower / Bathe    Complete by:  As directed    No dressing needed    Complete by:  As directed        Medication List    TAKE these medications   acetaminophen 325 MG tablet Commonly known as:  TYLENOL Take 2 tablets (650 mg total) by mouth every 6 (six) hours as needed.   aspirin 81 MG tablet Take 81 mg by mouth daily.   calcium-vitamin D 500-200 MG-UNIT tablet Commonly known as:  OSCAL WITH D Take 2 tablets by mouth daily.   clonazePAM 0.5 MG tablet Commonly known as:  KLONOPIN TAKE 1 TABLET BY MOUTH EVERY DAY TO TWICE DAILY AS NEEDED.   ibuprofen 800 MG tablet Commonly known as:  ADVIL,MOTRIN Take 1 tablet (800 mg total) by mouth every 8 (eight) hours as needed.   ibuprofen 200 MG tablet  Commonly known as:  ADVIL Take 1 tablet (200 mg total) by mouth every 6 (six) hours as needed for mild pain or moderate pain.   lisinopril-hydrochlorothiazide 20-12.5 MG tablet Commonly known as:  PRINZIDE,ZESTORETIC TAKE 1 TABLET BY MOUTH EVERY DAY   lovastatin 40 MG tablet Commonly known as:  MEVACOR TAKE 1-2 TABLETS BY MOUTH DAILY   mirtazapine 30 MG tablet Commonly known as:  REMERON TAKE 1 TABLET(30 MG) BY MOUTH AT BEDTIME   oxyCODONE 5 MG immediate release tablet Commonly known as:  Oxy IR/ROXICODONE Take 1-2 tablets (5-10 mg total) by mouth every 3 (three) hours as needed for severe pain or breakthrough pain.   pregabalin 50 MG capsule Commonly known as:  LYRICA Take 50 mg by mouth daily.   traMADol 50 MG  tablet Commonly known as:  ULTRAM Take 25-50 mg by mouth every 8 (eight) hours as needed for pain.            Durable Medical Equipment        Start     Ordered   01/17/16 1312  For home use only DME Bedside commode  Once     01/17/16 1311     Follow-up Information    [facility]. Schedule an appointment as soon as possible for a visit in 1 week(s).   Specialty:  General Surgery Why:  Call the office on Monday to make an appointment in 1 week  Contact information: [address], [place] [phone]          Signed: Hubbard Robinson 01/18/2016, 12:48 PM

## 2016-01-18 NOTE — Care Management Important Message (Signed)
Important Message  Patient Details  Name: Diana Lester MRN: [id number] Date of Birth: November 15, 1930   Medicare Important Message Given:  Yes    Bellarose Burtt A, RN 01/18/2016, 3:20 PM

## 2016-01-18 NOTE — Care Management Note (Signed)
Case Management Note  Patient Details  Name: Diana Lester MRN: [id number] Date of Birth: April 25, 1930  Subjective/Objective:        [name] reports that she has access to a "potty chair" and does not want to wait for the delivery of a bedside commode today. Referral for home health PT was faxed to [place] who accepts her insurance.             Action/Plan:   Expected Discharge Date:                  Expected Discharge Plan:     In-House Referral:     Discharge planning Services     Post Acute Care Choice:    Choice offered to:     DME Arranged:    DME Agency:     HH Arranged:    HH Agency:     Status of Service:     If discussed at [employer] of Stay Meetings, dates discussed:    Additional Comments:  Zhion Pevehouse A, RN 01/18/2016, 1:30 PM

## 2016-01-19 NOTE — Telephone Encounter (Signed)
Patient's son called in at this time. Follow-up appointment made for patient to see Dr. Azalee Course on Friday.  He also wanted to speak to someone in regards to hospital stay over night. The number for patient experience was provided.

## 2016-01-19 NOTE — Telephone Encounter (Signed)
Patient's pathology is still unavailable at this time. Patient will need Post-op appointment scheduled for Laparoscopic Appendectomy done on 01/11/16 by Dr. Pat Patrick for suture removal. We will discuss pathology results at that time.   Please schedule post-op for patient please.

## 2016-01-20 DIAGNOSIS — Z48815 Encounter for surgical aftercare following surgery on the digestive system: Secondary | ICD-10-CM | POA: Diagnosis not present

## 2016-01-20 DIAGNOSIS — Z85820 Personal history of malignant melanoma of skin: Secondary | ICD-10-CM | POA: Diagnosis not present

## 2016-01-20 DIAGNOSIS — I10 Essential (primary) hypertension: Secondary | ICD-10-CM | POA: Diagnosis not present

## 2016-01-20 DIAGNOSIS — Z7982 Long term (current) use of aspirin: Secondary | ICD-10-CM | POA: Diagnosis not present

## 2016-01-20 DIAGNOSIS — M48061 Spinal stenosis, lumbar region without neurogenic claudication: Secondary | ICD-10-CM | POA: Diagnosis not present

## 2016-01-20 DIAGNOSIS — M1991 Primary osteoarthritis, unspecified site: Secondary | ICD-10-CM | POA: Diagnosis not present

## 2016-01-20 DIAGNOSIS — G8918 Other acute postprocedural pain: Secondary | ICD-10-CM | POA: Diagnosis not present

## 2016-01-20 DIAGNOSIS — F329 Major depressive disorder, single episode, unspecified: Secondary | ICD-10-CM | POA: Diagnosis not present

## 2016-01-20 DIAGNOSIS — Z853 Personal history of malignant neoplasm of breast: Secondary | ICD-10-CM | POA: Diagnosis not present

## 2016-01-22 ENCOUNTER — Telehealth: Payer: Self-pay | Admitting: Internal Medicine

## 2016-01-22 DIAGNOSIS — D376 Neoplasm of uncertain behavior of liver, gallbladder and bile ducts: Secondary | ICD-10-CM | POA: Diagnosis not present

## 2016-01-22 NOTE — Telephone Encounter (Signed)
Called physiatry and they state that she has not been seen since march and patient is refusing to come in for a follow up. Patient was notified that she is unable to get refills without a follow up every 3 months.

## 2016-01-22 NOTE — Telephone Encounter (Signed)
Sorry meant to send to [employer]

## 2016-01-22 NOTE — Telephone Encounter (Signed)
Please schedule follow up for patient

## 2016-01-22 NOTE — Telephone Encounter (Signed)
Agree.  Would like to see her for a f/u to see what is needed and given her recent hospitalization.

## 2016-01-22 NOTE — Telephone Encounter (Signed)
Pt lvm stating that she needs to renew her rx of  tramadol. States that Dr. Sharlet Salina was the one that prescribed it but he is not going to give her another refill and told her to contact her PCP's office for a new prescription.

## 2016-01-22 NOTE — Telephone Encounter (Signed)
See attached.  I do not know how to put on schedule.  Need to notify pt that I would like to see her and let her know that I feel she needs to be seen.

## 2016-01-23 ENCOUNTER — Ambulatory Visit (INDEPENDENT_AMBULATORY_CARE_PROVIDER_SITE_OTHER): Payer: PPO | Admitting: Surgery

## 2016-01-23 ENCOUNTER — Telehealth: Payer: Self-pay | Admitting: *Deleted

## 2016-01-23 ENCOUNTER — Encounter: Payer: Self-pay | Admitting: Surgery

## 2016-01-23 ENCOUNTER — Other Ambulatory Visit: Payer: Self-pay | Admitting: Anatomic Pathology & Clinical Pathology

## 2016-01-23 VITALS — BP 107/71 | HR 85 | Temp 98.4°F | Ht 63.0 in | Wt 126.0 lb

## 2016-01-23 DIAGNOSIS — M549 Dorsalgia, unspecified: Secondary | ICD-10-CM

## 2016-01-23 DIAGNOSIS — K353 Acute appendicitis with localized peritonitis, without perforation or gangrene: Secondary | ICD-10-CM

## 2016-01-23 DIAGNOSIS — I10 Essential (primary) hypertension: Secondary | ICD-10-CM | POA: Insufficient documentation

## 2016-01-23 DIAGNOSIS — D1771 Benign lipomatous neoplasm of kidney: Secondary | ICD-10-CM | POA: Insufficient documentation

## 2016-01-23 DIAGNOSIS — M48061 Spinal stenosis, lumbar region without neurogenic claudication: Secondary | ICD-10-CM | POA: Insufficient documentation

## 2016-01-23 DIAGNOSIS — Z8659 Personal history of other mental and behavioral disorders: Secondary | ICD-10-CM | POA: Insufficient documentation

## 2016-01-23 MED ORDER — TRAMADOL HCL 50 MG PO TABS: 50.0000 mg | ORAL_TABLET | Freq: Three times a day (TID) | ORAL | 0 refills | Status: DC | PRN

## 2016-01-23 NOTE — Progress Notes (Signed)
82 year old female who had a laparoscopic appendectomy with a liver biopsy on 11 5. The patient has been overall doing well and getting her strength and her appetite back. Patient states that she's having loose stools but due to her dementia she is unsure of how many times a day. Patient states she's been taking prune juice and then having loose stools. Unsure at this time if this is due to the fact that she is taking too much prune juice and or just residual from the antibiotic she was on. Patient is having back pain in the mid back along the spine. Patient has been instructed to get up and move around more to see if we can help with this. Patient also feels like she is having to urinate a lot but due to her dementia unsure of how often that is. She denies any urgency burning foul-smelling or any difficulty urinating.  Vitals:   01/23/16 1413  BP: 107/71  Pulse: 85  Temp: 98.4 F (36.9 C)   PE:  Gen: NAD Abd: soft, mildly distended, non-tender, incisions c/d/i   A/P:  Patient is recovering well given that she is 82 years old. I did speak with the pathologist to has the tissue from the liver biopsy sent to [facility] and at this time they believe it may be a benign hemangioma however a running through more testing looking at specialist to see an ensure that it is not metastatic. The patient does not remember anything about this and the family would like to be present if someone is speaking to her about it. I did let the family know that it will take a couple more weeks for the full pathology to come back. Patient is having the back pain and we made a good plan for her to get up and walk around. Patient is to continue her protein shakes and yogurt for increased protein intake. Patient is going to record when she has the stools as well and stopped taking the prune juice issues are he had a stool that day. I will refill her prescription for tramadol which she states helps with her back pain and her leg pain. She  is attempting to get an appointment with her primary care physician as well. I will have her follow-up with one of my partners in one week to ensure that she is continuing to improve.

## 2016-01-23 NOTE — Telephone Encounter (Signed)
Pt requested a medication refill for tramadol, this Rx is originally written by Dr Margette Fast, her physiatry . Dr Margette Fast is out of the office and pt questioned if Dr.Scott could fill this for her.  Pt contact [phone]

## 2016-01-23 NOTE — Patient Instructions (Addendum)
Dr. Michael Boston has recommended that you walk 20 laps every day. She has also asked that you document your bowel movements. This includes the date, time, and whether it was urination, a bowel movement, or both.   Dr. Azalee Course has prescribed Tramadol for the back pain and to follow back up with Dr. Dahlia Byes to see if it's been helping. Please take the prescription to your pharmacy and refer below to view your future appointment date and time.    Please call our office with any questions or concerns.  Please do not submerge in a tub, hot tub, or pool until incisions are completely sealed.  Use sun block to incision area over the next year if this area will be exposed to sun. This helps decrease scarring.  You may now resume your normal activities. Listen to your body when lifting, if you have pain when lifting, stop and then try again in a few days.  If you develop redness, drainage, or pain at incision sites- call our office immediately and speak with a nurse.

## 2016-01-23 NOTE — Telephone Encounter (Signed)
Can you give me an appropriate date and time to schedule pt

## 2016-01-23 NOTE — Telephone Encounter (Signed)
Per Dr.Scott, patient needs follow up in order to receive this, see other message

## 2016-01-23 NOTE — Telephone Encounter (Signed)
Lm on vm for pt to call back and schedule a follow up app soon

## 2016-01-24 ENCOUNTER — Other Ambulatory Visit: Payer: Self-pay | Admitting: Anatomic Pathology & Clinical Pathology

## 2016-01-26 LAB — SURGICAL PATHOLOGY

## 2016-01-27 NOTE — Telephone Encounter (Signed)
A nurse from [facility] called and wanted to find out of we had called pt to make an appt to come in sooner. Stated that the pt was been very confused and does not remember going to Dr. Azalee Course and have stitches removed. Please advise, thank you!  [phone]

## 2016-01-28 ENCOUNTER — Encounter: Payer: Self-pay | Admitting: Surgery

## 2016-01-28 ENCOUNTER — Ambulatory Visit (INDEPENDENT_AMBULATORY_CARE_PROVIDER_SITE_OTHER): Payer: PPO | Admitting: Surgery

## 2016-01-28 VITALS — BP 134/73 | HR 100 | Temp 98.1°F | Wt 127.0 lb

## 2016-01-28 DIAGNOSIS — Z09 Encounter for follow-up examination after completed treatment for conditions other than malignant neoplasm: Secondary | ICD-10-CM

## 2016-01-28 NOTE — Telephone Encounter (Signed)
Patient states "she just doesn't feel like coming to the office" she states she saw another doctor today and he said everything was ok

## 2016-01-28 NOTE — Telephone Encounter (Signed)
Noted and called.

## 2016-01-28 NOTE — Telephone Encounter (Signed)
Noted.  Please notify home care that called to request appt.

## 2016-01-28 NOTE — Telephone Encounter (Signed)
Please advise 

## 2016-01-28 NOTE — Progress Notes (Signed)
S/p lap appy and liver bx by Dr. Pat Patrick Doing well Main issue is back pain. Taking PO, afebrile  PE NAD Abd: soft, NT, incision c/d/i  A/P doing well Advice to see PCP for her back pain No surgical issues Path d/w pt in detail RTC prn

## 2016-01-28 NOTE — Patient Instructions (Signed)
Please call our office with any questions or concerns.  Please do not submerge in a tub, hot tub, or pool until incisions are completely sealed.  Use sun block to incision area over the next year if this area will be exposed to sun. This helps decrease scarring.  You may now resume your normal activities. Listen to your body when lifting, if you have pain when lifting, stop and then try again in a few days.  If you develop redness, drainage, or pain at incision sites- call our office immediately and speak with a nurse.   For your constipation, you are able to take Miralax 17 G daily.

## 2016-01-28 NOTE — Telephone Encounter (Signed)
We are not here the rest of the week.  I can see her at 1:30 on 02/04/16.  If acute symptoms now, recommend to acute care or [facility] urgent [facility].

## 2016-02-02 ENCOUNTER — Telehealth: Payer: Self-pay

## 2016-02-02 NOTE — Telephone Encounter (Signed)
  Oncology Nurse Navigator Documentation Called and spoke with [name] regarding an appt reminder she received about tumor board conference. Educated that Dr. Azalee Course likely wants to discuss her pathology further at conference and that she does not need to attend.  Navigator Location: [facility] (02/02/16 1500)   )Navigator Encounter Type: Telephone (02/02/16 1500)                                                    Time Spent with Patient: 15 (02/02/16 1500)

## 2016-02-05 ENCOUNTER — Inpatient Hospital Stay: Payer: PPO | Attending: Anatomic Pathology & Clinical Pathology

## 2016-02-10 ENCOUNTER — Telehealth: Payer: Self-pay | Admitting: Surgery

## 2016-02-10 NOTE — Telephone Encounter (Signed)
Patient returned my call. Patient wanted to know what her pathology report stated. I reminded her that when she came in to see Dr. Dahlia Byes, that he had went over her pathology report. I told her that her pathology report showed  Appendicitis and periappendicitis (Negative for malignancy). Patient understood and had no further questions.

## 2016-02-10 NOTE — Telephone Encounter (Signed)
Patient has called and would like to discuss her results of her pathology from her laparoscopic appendectomy from 01/11/16 with Dr Pat Patrick.   It looks as if Dr Dahlia Byes went over results at the last office visit on 01/28/16, however the patient does not recall going over this.   Please call patient at [phone].

## 2016-02-10 NOTE — Telephone Encounter (Signed)
Called patient back and left her a voicemail to return my call.

## 2016-02-14 ENCOUNTER — Other Ambulatory Visit: Payer: Self-pay | Admitting: Internal Medicine

## 2016-02-20 ENCOUNTER — Telehealth: Payer: Self-pay

## 2016-02-20 NOTE — Telephone Encounter (Signed)
Left message to return call. Need to find out who gave home care orders, she recently had surgery. Per Dr. Nicki Reaper.

## 2016-03-10 ENCOUNTER — Ambulatory Visit (INDEPENDENT_AMBULATORY_CARE_PROVIDER_SITE_OTHER): Payer: PPO

## 2016-03-10 VITALS — BP 128/72 | HR 78 | Temp 98.1°F | Resp 14 | Ht 62.0 in | Wt 128.8 lb

## 2016-03-10 DIAGNOSIS — Z0000 Encounter for general adult medical examination without abnormal findings: Secondary | ICD-10-CM | POA: Diagnosis not present

## 2016-03-10 NOTE — Progress Notes (Signed)
  I have reviewed the above information and agree with above.   Devanie Galanti, MD 

## 2016-03-10 NOTE — Progress Notes (Signed)
  I have reviewed the above information and agree with above.   Shonteria Abeln, MD 

## 2016-03-10 NOTE — Progress Notes (Signed)
Subjective:   Diana Lester is a 83 y.o. female who presents for Medicare Annual (Subsequent) preventive examination.  Review of Systems:  No ROS.  Medicare Wellness Visit.  Cardiac Risk Factors include: advanced age (>54men, >5 women);hypertension     Objective:     Vitals: BP 128/72 (BP Location: Left Arm, Patient Position: Sitting, Cuff Size: Normal)   Pulse 78   Temp 98.1 F (36.7 C) (Oral)   Resp 14   Ht 5\' 2"  (1.575 m)   Wt 128 lb 12.8 oz (58.4 kg)   SpO2 96%   BMI 23.56 kg/m   Body mass index is 23.56 kg/m.   Tobacco History  Smoking Status  . Former Smoker  . Packs/day: 0.50  . Years: 4.00  . Types: Cigarettes  . Quit date: 12/09/1984  Smokeless Tobacco  . Never Used    Comment: quit 12/09/84     Counseling given: Not Answered   Past Medical History:  Diagnosis Date  . Arthritis   . Breast cancer ([place]) 1995   LT LUMPECTOMY & s/p XRT  . Depression   . Hx of colonic polyp   . Hypercholesterolemia   . Hypertension   . Melanoma ([place])   . Spinal stenosis    lumbar   Past Surgical History:  Procedure Laterality Date  . BREAST BIOPSY Left 2006   EXCISIONAL - NEG  . BREAST BIOPSY Left 1995   LT LUMPECTOMY FOR CA  . BREAST LUMPECTOMY Left 1995  . EYE SURGERY Bilateral    cataract excision  . LAPAROSCOPIC APPENDECTOMY N/A 01/11/2016   Procedure: APPENDECTOMY LAPAROSCOPIC;  Surgeon: Dia Crawford III, MD;  Location: [facility] ORS;  Service: General;  Laterality: N/A;   Family History  Problem Relation Age of Onset  . Arthritis Mother   . Hyperlipidemia Mother   . Hypertension Mother   . Hyperlipidemia Sister   . Kidney cancer Sister   . Breast cancer Neg Hx    History  Sexual Activity  . Sexual activity: No    Outpatient Encounter Prescriptions as of 03/10/2016  Medication Sig  . aspirin 81 MG tablet Take 81 mg by mouth daily.  . clonazePAM (KLONOPIN) 0.5 MG tablet TAKE 1 TABLET BY MOUTH EVERY DAY TO TWICE DAILY AS NEEDED.  Marland Kitchen ibuprofen  (ADVIL,MOTRIN) 800 MG tablet Take 1 tablet by mouth 3 (three) times daily as needed.  Marland Kitchen lisinopril-hydrochlorothiazide (PRINZIDE,ZESTORETIC) 20-12.5 MG tablet TAKE 1 TABLET BY MOUTH EVERY DAY  . lovastatin (MEVACOR) 40 MG tablet TAKE 1-2 TABLETS BY MOUTH DAILY  . mirtazapine (REMERON) 30 MG tablet TAKE 1 TABLET(30 MG) BY MOUTH AT BEDTIME  . pregabalin (LYRICA) 50 MG capsule Take 50 mg by mouth daily.   . traMADol (ULTRAM) 50 MG tablet Take 1 tablet (50 mg total) by mouth every 8 (eight) hours as needed.  . [DISCONTINUED] calcium-vitamin D (OSCAL WITH D) 500-200 MG-UNIT per tablet Take 2 tablets by mouth daily.  . Calcium Carb-Cholecalciferol (CALCIUM-VITAMIN D) 500-200 MG-UNIT tablet Take by mouth.   No facility-administered encounter medications on file as of 03/10/2016.     Activities of Daily Living In your present state of health, do you have any difficulty performing the following activities: 03/10/2016 01/10/2016  Hearing? N -  Vision? N -  Difficulty concentrating or making decisions? Y -  Walking or climbing stairs? N -  Dressing or bathing? N -  Doing errands, shopping? N N  Preparing Food and eating ? N -  Using the Toilet? N -  In the past six months, have you accidently leaked urine? N -  Do you have problems with loss of bowel control? N -  Managing your Medications? N -  Managing your Finances? N -  Housekeeping or managing your Housekeeping? N -  Some recent data might be hidden    Patient Care Team: Einar Pheasant, MD as PCP - General (Internal Medicine)    Assessment:    This is a routine wellness examination for Winn[place]. The goal of the wellness visit is to assist the patient how to close the gaps in care and create a preventative care plan for the patient.   Taking calcium VIT D as appropriate/Osteoporosis risk reviewed.  DEXA Scan deferred per patient request to follow up with PCP prior to ordering.  Educational material provided.  Medications reviewed; taking  without issues or barriers.  Safety issues reviewed; lives alone.  Smoke detectors in the home. No firearms in the home. Wears seatbelts when driving or riding with others. No violence in the home.  No identified risk were noted; The patient was oriented x 3; appropriate in dress and manner and no objective failures at ADL's or IADL's.   BMI; discussed the importance of a healthy diet, water intake and exercise. Educational material provided.  HTN; followed by PCP.  Patient Concerns: None at this time. Follow up with PCP as needed.  Exercise Activities and Dietary recommendations Current Exercise Habits: The patient does not participate in regular exercise at present  Goals    . Increase physical activity          Walk for exercise as tolerated Chair and standing exercises daily    . Increase water intake          Stay hydrated and drink plenty of fluids/water      Fall Risk Fall Risk  03/10/2016 12/22/2015 11/18/2015 03/11/2015 11/06/2014  Falls in the past year? No No No No No   Depression Screen PHQ 2/9 Scores 03/10/2016 12/22/2015 11/18/2015 07/14/2015  PHQ - 2 Score 0 0 0 3  PHQ- 9 Score - - - 8     Cognitive Function MMSE - Mini Mental State Exam 03/10/2016 03/11/2015  Orientation to time 5 5  Orientation to Place 5 5  Registration 3 3  Attention/ Calculation 5 5  Recall 3 3  Language- name 2 objects 2 2  Language- repeat 1 1  Language- follow 3 step command 3 3  Language- read & follow direction 1 1  Write a sentence 1 1  Copy design 1 1  Total score 30 30        Immunization History  Administered Date(s) Administered  . DTaP 08/31/2011  . Influenza Split 12/11/2012, 12/13/2013  . Influenza, High Dose Seasonal PF 11/18/2015  . Influenza,inj,Quad PF,36+ Mos 11/06/2014  . Pneumococcal Conjugate-13 12/22/2015  . Tdap 08/31/2011  . Zoster 01/29/2011   Screening Tests Health Maintenance  Topic Date Due  . DEXA SCAN  01/31/1998  . MAMMOGRAM  12/10/2016    . PNA vac Low Risk Adult (2 of 2 - PPSV23) 12/21/2016  . TETANUS/TDAP  08/30/2021  . INFLUENZA VACCINE  Completed  . ZOSTAVAX  Completed      Plan:    End of life planning; Advance aging; Advanced directives discussed. Copy of current HCPOA/Living Will requested.  Medicare Attestation I have personally reviewed: The patient's medical and social history Their use of alcohol, tobacco or illicit drugs Their current medications and supplements The patient's functional ability including  ADLs,fall risks, home safety risks, cognitive, and hearing and visual impairment Diet and physical activities Evidence for depression   The patient's weight, height, BMI, and visual acuity have been recorded in the chart.  I have made referrals and provided education to the patient based on review of the above and I have provided the patient with a written personalized care plan for preventive services.    During the course of the visit the patient was educated and counseled about the following appropriate screening and preventive services:   Vaccines to include Pneumoccal, Influenza, Hepatitis B, Td, Zostavax, HCV  Electrocardiogram  Cardiovascular Disease  Colorectal cancer screening  Bone density screening  Diabetes screening  Glaucoma screening  Mammography/PAP  Nutrition counseling   Patient Instructions (the written plan) was given to the patient.   Varney Biles, LPN  11/12/8862

## 2016-03-10 NOTE — Patient Instructions (Addendum)
  Diana Lester , Thank you for taking time to come for your Medicare Wellness Visit. I appreciate your ongoing commitment to your health goals. Please review the following plan we discussed and let me know if I can assist you in the future.   Follow up with Dr. Nicki Reaper as needed.  These are the goals we discussed: Goals    . Increase physical activity          Walk for exercise as tolerated Chair and standing exercises daily    . Increase water intake          Stay hydrated and drink plenty of fluids/water       This is a list of the screening recommended for you and due dates:  Health Maintenance  Topic Date Due  . DEXA scan (bone density measurement)  01/31/1998  . Mammogram  12/10/2016  . Pneumonia vaccines (2 of 2 - PPSV23) 12/21/2016  . Tetanus Vaccine  08/30/2021  . Flu Shot  Completed  . Shingles Vaccine  Completed   Bone Densitometry Introduction Bone densitometry is an imaging test that uses a special X-ray to measure the amount of calcium and other minerals in your bones (bone density). This test is also known as a bone mineral density test or dual-energy X-ray absorptiometry (DXA). The test can measure bone density at your hip and your spine. It is similar to having a regular X-ray. You may have this test to:  Diagnose a condition that causes weak or thin bones (osteoporosis).  Predict your risk of a broken bone (fracture).  Determine how well osteoporosis treatment is working. Tell a health care provider about:  Any allergies you have.  All medicines you are taking, including vitamins, herbs, eye drops, creams, and over-the-counter medicines.  Any problems you or family members have had with anesthetic medicines.  Any blood disorders you have.  Any surgeries you have had.  Any medical conditions you have.  Possibility of pregnancy.  Any other medical test you had within the previous 14 days that used contrast material. What are the risks? Generally,  this is a safe procedure. However, problems can occur and may include the following:  This test exposes you to a very small amount of radiation.  The risks of radiation exposure may be greater to unborn children. What happens before the procedure?  Do not take any calcium supplements for 24 hours before having the test. You can otherwise eat and drink what you usually do.  Take off all metal jewelry, eyeglasses, dental appliances, and any other metal objects. What happens during the procedure?  You may lie on an exam table. There will be an X-ray generator below you and an imaging device above you.  Other devices, such as boxes or braces, may be used to position your body properly for the scan.  You will need to lie still while the machine slowly scans your body.  The images will show up on a computer monitor. What happens after the procedure? You may need more testing at a later time. This information is not intended to replace advice given to you by your health care provider. Make sure you discuss any questions you have with your health care provider. Document Released: 03/16/2004 Document Revised: 07/31/2015 Document Reviewed: 08/02/2013  2017 Elsevier

## 2016-03-30 ENCOUNTER — Encounter: Payer: Self-pay | Admitting: Internal Medicine

## 2016-03-30 ENCOUNTER — Ambulatory Visit (INDEPENDENT_AMBULATORY_CARE_PROVIDER_SITE_OTHER): Payer: PPO | Admitting: Internal Medicine

## 2016-03-30 DIAGNOSIS — N183 Chronic kidney disease, stage 3 unspecified: Secondary | ICD-10-CM

## 2016-03-30 DIAGNOSIS — N281 Cyst of kidney, acquired: Secondary | ICD-10-CM

## 2016-03-30 DIAGNOSIS — F32A Depression, unspecified: Secondary | ICD-10-CM

## 2016-03-30 DIAGNOSIS — Z0000 Encounter for general adult medical examination without abnormal findings: Secondary | ICD-10-CM

## 2016-03-30 DIAGNOSIS — F419 Anxiety disorder, unspecified: Secondary | ICD-10-CM

## 2016-03-30 DIAGNOSIS — I10 Essential (primary) hypertension: Secondary | ICD-10-CM | POA: Diagnosis not present

## 2016-03-30 DIAGNOSIS — E785 Hyperlipidemia, unspecified: Secondary | ICD-10-CM | POA: Diagnosis not present

## 2016-03-30 DIAGNOSIS — Z853 Personal history of malignant neoplasm of breast: Secondary | ICD-10-CM | POA: Diagnosis not present

## 2016-03-30 DIAGNOSIS — F329 Major depressive disorder, single episode, unspecified: Secondary | ICD-10-CM

## 2016-03-30 NOTE — Progress Notes (Signed)
[name], female   DOB: [birth year], 83 y.o.   MRN: [id number]   Subjective:    [name], female    DOB: 07/30/1930, 83 y.o.   MRN: [id number]  HPI  Patient with past history of hypertension, breast cancer and hypercholesterolemia.  She comes in today to follow up on these issues as well as for a complete physical exam.  She recently had surgery for appendicitis.  Had lap appendectomy and liver biopsy.  Biopsy (per phone note) - ok.  She states she is doing better. Feels better.  Eating.  No chest pain.  No sob.  No acid reflux.  No abdominal pain or cramping.  Bowels stable.     Past Medical History:  Diagnosis Date  . Arthritis   . Breast cancer ([place]) 1995   LT LUMPECTOMY & s/p XRT  . Depression   . Hx of colonic polyp   . Hypercholesterolemia   . Hypertension   . Melanoma ([place])   . Spinal stenosis    lumbar   Past Surgical History:  Procedure Laterality Date  . BREAST BIOPSY Left 2006   EXCISIONAL - NEG  . BREAST BIOPSY Left 1995   LT LUMPECTOMY FOR CA  . BREAST LUMPECTOMY Left 1995  . EYE SURGERY Bilateral    cataract excision  . LAPAROSCOPIC APPENDECTOMY N/A 01/11/2016   Procedure: APPENDECTOMY LAPAROSCOPIC;  Surgeon: Dia Crawford III, MD;  Location: [facility] ORS;  Service: General;  Laterality: N/A;   Family History  Problem Relation Age of Onset  . Arthritis Mother   . Hyperlipidemia Mother   . Hypertension Mother   . Hyperlipidemia Sister   . Kidney cancer Sister   . Breast cancer Neg Hx    Social History   Social History  . Marital status: Widowed    Spouse name: N/A  . Number of children: 2  . Years of education: N/A   Social History Main Topics  . Smoking status: Former Smoker    Packs/day: 0.50    Years: 4.00    Types: Cigarettes    Quit date: 12/09/1984  . Smokeless tobacco: Never Used     Comment: quit 12/09/84  . Alcohol use No     Comment: sparingly  . Drug use: No  . Sexual activity: No   Other Topics Concern   . None   Social History Narrative  . None    Outpatient Encounter Prescriptions as of 03/30/2016  Medication Sig  . aspirin 81 MG tablet Take 81 mg by mouth daily.  . Calcium Carb-Cholecalciferol (CALCIUM-VITAMIN D) 500-200 MG-UNIT tablet Take 2 tablets by mouth.   . clonazePAM (KLONOPIN) 0.5 MG tablet TAKE 1 TABLET BY MOUTH EVERY DAY TO TWICE DAILY AS NEEDED.  Marland Kitchen lisinopril-hydrochlorothiazide (PRINZIDE,ZESTORETIC) 20-12.5 MG tablet TAKE 1 TABLET BY MOUTH EVERY DAY  . lovastatin (MEVACOR) 40 MG tablet TAKE 1-2 TABLETS BY MOUTH DAILY  . mirtazapine (REMERON) 30 MG tablet TAKE 1 TABLET(30 MG) BY MOUTH AT BEDTIME  . Multiple Vitamin (MULTIVITAMIN WITH MINERALS) TABS tablet Take 1 tablet by mouth daily.  . Multiple Vitamin (MULTIVITAMIN) LIQD Take 5 mLs by mouth daily.  . pregabalin (LYRICA) 50 MG capsule Take 50 mg by mouth 2 (two) times daily.   . prenatal vitamin w/FE, FA (PRENATAL 1 + 1) 27-1 MG TABS tablet Take 1 tablet by mouth daily at 12 noon.  . traMADol (ULTRAM) 50 MG tablet Take 1 tablet (50 mg total) by mouth every 8 (eight)  hours as needed.  Marland Kitchen ibuprofen (ADVIL,MOTRIN) 800 MG tablet Take 1 tablet by mouth 3 (three) times daily as needed.   No facility-administered encounter medications on file as of 03/30/2016.     Review of Systems  Constitutional: Negative for appetite change and unexpected weight change.  HENT: Negative for congestion and sinus pressure.   Eyes: Negative for pain and visual disturbance.  Respiratory: Negative for cough, chest tightness and shortness of breath.   Cardiovascular: Negative for chest pain, palpitations and leg swelling.  Gastrointestinal: Negative for abdominal pain, diarrhea, nausea and vomiting.  Genitourinary: Negative for difficulty urinating and dysuria.  Musculoskeletal: Negative for back pain and joint swelling.  Skin: Negative for color change and rash.  Neurological: Negative for dizziness, light-headedness and headaches.    Hematological: Negative for adenopathy. Does not bruise/bleed easily.  Psychiatric/Behavioral: Negative for agitation and dysphoric mood.       Objective:    Physical Exam  Constitutional: She is oriented to person, place, and time. She appears well-developed and well-nourished. No distress.  HENT:  Nose: Nose normal.  Mouth/Throat: Oropharynx is clear and moist.  Eyes: Right eye exhibits no discharge. Left eye exhibits no discharge. No scleral icterus.  Neck: Neck supple. No thyromegaly present.  Cardiovascular: Normal rate and regular rhythm.   Pulmonary/Chest: Breath sounds normal. No accessory muscle usage. No tachypnea. No respiratory distress. She has no decreased breath sounds. She has no wheezes. She has no rhonchi. Right breast exhibits no inverted nipple, no mass, no nipple discharge and no tenderness (no axillary adenopathy). Left breast exhibits no inverted nipple, no mass, no nipple discharge and no tenderness (no axilarry adenopathy).  Abdominal: Soft. Bowel sounds are normal. There is no tenderness.  Musculoskeletal: She exhibits no edema or tenderness.  Some increased right shoulder pain with abduction above 90 degrees.    Lymphadenopathy:    She has no cervical adenopathy.  Neurological: She is alert and oriented to person, place, and time.  Skin: Skin is warm. No rash noted. No erythema.  Psychiatric: She has a normal mood and affect. Her behavior is normal.    BP 136/78 (BP Location: Left Arm, Patient Position: Sitting, Cuff Size: Normal)   Pulse 90   Temp 98.1 F (36.7 C) (Oral)   Resp 16   Ht 5\' 2"  (1.575 m)   Wt 131 lb 4 oz (59.5 kg)   SpO2 97%   BMI 24.01 kg/m  Wt Readings from Last 3 Encounters:  03/30/16 131 lb 4 oz (59.5 kg)  03/10/16 128 lb 12.8 oz (58.4 kg)  01/28/16 127 lb (57.6 kg)     Lab Results  Component Value Date   WBC 13.1 (H) 01/18/2016   HGB 11.9 (L) 01/18/2016   HCT 35.0 01/18/2016   PLT 382 01/18/2016   GLUCOSE 95 01/18/2016    CHOL 222 (H) 12/15/2015   TRIG 194.0 (H) 12/15/2015   HDL 63.20 12/15/2015   LDLDIRECT 126.7 04/17/2013   LDLCALC 120 (H) 12/15/2015   ALT 17 01/10/2016   AST 28 01/10/2016   NA 139 01/18/2016   K 3.9 01/18/2016   CL 107 01/18/2016   CREATININE 1.31 (H) 01/18/2016   BUN 12 01/18/2016   CO2 24 01/18/2016   TSH 2.85 12/15/2015    Ct Abdomen Pelvis Wo Contrast  Result Date: 01/10/2016 CLINICAL DATA:  Right lower quadrant pain onset this evening. Febrile. EXAM: CT ABDOMEN AND PELVIS WITHOUT CONTRAST TECHNIQUE: Multidetector CT imaging of the abdomen and pelvis was performed following  the standard protocol without IV contrast. COMPARISON:  None. FINDINGS: Lower chest: Noncalcified nodules in the right lung base, at least 3 in number, measuring 3-5 mm. These are visible on images 5 and 7, series 3. Hepatobiliary: Multiple solid liver masses measuring from 0.8-2.6 cm. Suspicious for hepatic hematogenous metastases. Gallbladder and bile ducts are unremarkable. Pancreas: Unremarkable. No pancreatic ductal dilatation or surrounding inflammatory changes. Spleen: Normal in size without focal abnormality. Adrenals/Urinary Tract: Adrenal glands are unremarkable. Kidneys are normal, without renal calculi, focal lesion, or hydronephrosis. Bladder is unremarkable. Stomach/Bowel: Stomach and small bowel are unremarkable. There is acute inflammation surrounding the tip of the cecum, as well as the appendix. This likely represents acute acute appendicitis. There also is diverticular disease of the cecum and ascending colon but the inflammation is not centered on a diverticulum. No extraluminal air or drainable fluid collection. Remainder of the colon is remarkable only for extensive diverticulosis. Vascular/Lymphatic: Aortic atherosclerosis. No enlarged abdominal or pelvic lymph nodes. Reproductive: Uterus and bilateral adnexa are unremarkable. Other: No ascites. Musculoskeletal: No significant skeletal lesions.  IMPRESSION: 1. Acute inflammatory process surrounding the cecal tip, likely appendicitis. 2. Multiple solid liver lesions, suspicious for hematogenous metastases. Recommend CT or MRI with contrast. 3. Indeterminate noncalcified nodules in the right lung base. 4. Colonic diverticulosis These results were called by telephone at the time of interpretation on 01/10/2016 at 5:27 am to Dr. Marjean Donna , who verbally acknowledged these results. Electronically Signed   By: Andreas Newport M.D.   On: 01/10/2016 05:33   Dg Chest 2 View  Result Date: 01/10/2016 CLINICAL DATA:  Right lower quadrant pain since evening. EXAM: CHEST  2 VIEW COMPARISON:  04/07/2010 FINDINGS: Mild unchanged hyperinflation. Minimal linear scarring in the bases. The lungs are otherwise clear. Hilar, mediastinal and cardiac contours are normal and unchanged. Pulmonary vasculature is normal. No pleural effusions. IMPRESSION: Hyperinflation, unchanged.  No acute findings are evident. Electronically Signed   By: Andreas Newport M.D.   On: 01/10/2016 01:47       Assessment & Plan:   Problem List Items Addressed This Visit    Anxiety    Discussed with her today.  She declines psych referral.  Overall she feels she is doing better.  Follow.        Chronic kidney disease, stage III (moderate)    Has been seen by [facility] nephrology.  Creatinine 1.3-1.4.  Follow.        Depression    Discussed again today referral to psych.  She declines.  Follow.  She feels she is doing better.        Essential hypertension, benign    Blood pressure under good control.  Continue same medication regimen.  Follow pressures.  Follow metabolic panel.        Relevant Orders   CBC with Differential/Platelet   Basic metabolic panel   Health care maintenance    Physical today 03/30/16.  Mammogram 12/18/15 - Briads I.  Colonoscopy 2013 - Dr [name].        History of breast cancer    Mammogram 12/18/15 - Birads I.        Hyperlipidemia    On  lovastatin.  Low cholesterol diet and exercise.  Follow lipid panel and liver function tests.        Hypertension    Blood pressure under good control.  Continue same medication regimen.  Follow pressures.  Follow metabolic panel.        Relevant Orders  Hepatic function panel   Lipid panel   Renal cyst    W/up previously as outlined in overview.  Continue f/u with nephrology.            Einar Pheasant, MD

## 2016-03-30 NOTE — Progress Notes (Signed)
Pre visit review using our clinic review tool, if applicable. No additional management support is needed unless otherwise documented below in the visit note. 

## 2016-04-04 ENCOUNTER — Encounter: Payer: Self-pay | Admitting: Internal Medicine

## 2016-04-04 NOTE — Assessment & Plan Note (Signed)
W/up previously as outlined in overview.  Continue f/u with nephrology.

## 2016-04-04 NOTE — Assessment & Plan Note (Signed)
Discussed with her today.  She declines psych referral.  Overall she feels she is doing better.  Follow.

## 2016-04-04 NOTE — Assessment & Plan Note (Signed)
Has been seen by [facility] nephrology.  Creatinine 1.3-1.4.  Follow.

## 2016-04-04 NOTE — Assessment & Plan Note (Signed)
Discussed again today referral to psych.  She declines.  Follow.  She feels she is doing better.

## 2016-04-04 NOTE — Assessment & Plan Note (Signed)
Blood pressure under good control.  Continue same medication regimen.  Follow pressures.  Follow metabolic panel.   

## 2016-04-04 NOTE — Assessment & Plan Note (Signed)
Physical today 03/30/16.  Mammogram 12/18/15 - Briads I.  Colonoscopy 2013 - Dr [name].

## 2016-04-04 NOTE — Assessment & Plan Note (Signed)
Mammogram 12/18/15 - Birads I.

## 2016-04-04 NOTE — Assessment & Plan Note (Signed)
On lovastatin.  Low cholesterol diet and exercise.  Follow lipid panel and liver function tests.   

## 2016-04-06 DIAGNOSIS — H353131 Nonexudative age-related macular degeneration, bilateral, early dry stage: Secondary | ICD-10-CM | POA: Diagnosis not present

## 2016-04-12 ENCOUNTER — Other Ambulatory Visit: Payer: Self-pay | Admitting: Internal Medicine

## 2016-04-12 DIAGNOSIS — M549 Dorsalgia, unspecified: Secondary | ICD-10-CM

## 2016-04-12 NOTE — Telephone Encounter (Signed)
Patient received last refill from another provider

## 2016-04-13 ENCOUNTER — Other Ambulatory Visit (INDEPENDENT_AMBULATORY_CARE_PROVIDER_SITE_OTHER): Payer: PPO

## 2016-04-13 DIAGNOSIS — I10 Essential (primary) hypertension: Secondary | ICD-10-CM | POA: Diagnosis not present

## 2016-04-13 LAB — HEPATIC FUNCTION PANEL
ALBUMIN: 4.1 g/dL (ref 3.5–5.2)
ALK PHOS: 81 U/L (ref 39–117)
ALT: 12 U/L (ref 0–35)
AST: 19 U/L (ref 0–37)
Bilirubin, Direct: 0 mg/dL (ref 0.0–0.3)
TOTAL PROTEIN: 6.9 g/dL (ref 6.0–8.3)
Total Bilirubin: 0.4 mg/dL (ref 0.2–1.2)

## 2016-04-13 LAB — LIPID PANEL
Cholesterol: 226 mg/dL — ABNORMAL HIGH (ref 0–200)
HDL: 66 mg/dL (ref >39.00)
LDL Cholesterol: 125 mg/dL — ABNORMAL HIGH (ref 0–99)
NONHDL: 159.7
Total CHOL/HDL Ratio: 3
Triglycerides: 173 mg/dL — ABNORMAL HIGH (ref 0.0–149.0)
VLDL: 34.6 mg/dL (ref 0.0–40.0)

## 2016-04-13 LAB — CBC WITH DIFFERENTIAL/PLATELET
BASOS ABS: 0.1 10*3/uL (ref 0.0–0.1)
Basophils Relative: 0.8 % (ref 0.0–3.0)
EOS PCT: 2.5 % (ref 0.0–5.0)
Eosinophils Absolute: 0.3 10*3/uL (ref 0.0–0.7)
HEMATOCRIT: 43.4 % (ref 36.0–46.0)
Hemoglobin: 14 g/dL (ref 12.0–15.0)
Lymphocytes Relative: 11.7 % — ABNORMAL LOW (ref 12.0–46.0)
Lymphs Abs: 1.3 10*3/uL (ref 0.7–4.0)
MCHC: 32.3 g/dL (ref 30.0–36.0)
MCV: 87.1 fl (ref 78.0–100.0)
MONOS PCT: 7.4 % (ref 3.0–12.0)
Monocytes Absolute: 0.8 10*3/uL (ref 0.1–1.0)
NEUTROS ABS: 8.6 10*3/uL — AB (ref 1.4–7.7)
Neutrophils Relative %: 77.6 % — ABNORMAL HIGH (ref 43.0–77.0)
PLATELETS: 268 10*3/uL (ref 150.0–400.0)
RBC: 4.98 Mil/uL (ref 3.87–5.11)
RDW: 14 % (ref 11.5–15.5)
WBC: 11.1 10*3/uL — ABNORMAL HIGH (ref 4.0–10.5)

## 2016-04-13 LAB — BASIC METABOLIC PANEL
BUN: 25 mg/dL — ABNORMAL HIGH (ref 6–23)
CHLORIDE: 102 meq/L (ref 96–112)
CO2: 31 meq/L (ref 19–32)
Calcium: 9.8 mg/dL (ref 8.4–10.5)
Creatinine, Ser: 1.51 mg/dL — ABNORMAL HIGH (ref 0.40–1.20)
GFR: 34.96 mL/min — ABNORMAL LOW (ref >60.00)
Glucose, Bld: 127 mg/dL — ABNORMAL HIGH (ref 70–99)
Potassium: 4.8 mEq/L (ref 3.5–5.1)
Sodium: 138 mEq/L (ref 135–145)

## 2016-04-13 NOTE — Telephone Encounter (Signed)
Per last visit, I was under the impression she was not taking or needing this medication.

## 2016-04-19 ENCOUNTER — Telehealth: Payer: Self-pay | Admitting: *Deleted

## 2016-04-19 NOTE — Telephone Encounter (Signed)
Pt informed see lab results note.

## 2016-04-19 NOTE — Telephone Encounter (Signed)
Pt requested lab results  Pt contact [phone]

## 2016-04-21 ENCOUNTER — Other Ambulatory Visit: Payer: Self-pay | Admitting: Internal Medicine

## 2016-04-21 DIAGNOSIS — N183 Chronic kidney disease, stage 3 unspecified: Secondary | ICD-10-CM

## 2016-04-21 NOTE — Progress Notes (Signed)
Order placed for nephrology referral.   °

## 2016-05-08 ENCOUNTER — Encounter: Payer: Self-pay | Admitting: Gynecology

## 2016-05-08 ENCOUNTER — Ambulatory Visit
Admission: EM | Admit: 2016-05-08 | Discharge: 2016-05-08 | Disposition: A | Discharge status: Home / Self Care | Payer: PPO | Attending: Family Medicine | Admitting: Family Medicine

## 2016-05-08 DIAGNOSIS — H6123 Impacted cerumen, bilateral: Secondary | ICD-10-CM | POA: Diagnosis not present

## 2016-05-08 NOTE — ED Triage Notes (Signed)
Per patient left ear cerumen impaction.

## 2016-05-08 NOTE — ED Provider Notes (Signed)
[facility]    CSN: [id number] Arrival date & time: 05/08/16  1050     History   Chief Complaint Chief Complaint  Patient presents with  . Cerumen Impaction    HPI Diana Lester is a 83 y.o. female.   Patient is here due to excess wax in both use with the left ear being much worse than the right ear she's had excessive wax buildup before she states that she's having discomfort and difficulty hearing from her left ear as well she is multiple other medical problems including past history of breast cancer with lumpectomy history colonic polyps hypertension depression and spinal stenosis to name a few. She is a former smoker. She is allergic contrast agents. An family medical history is really not pertinent to today's visit.   The history is provided by the patient. No language interpreter was used.  Ear Fullness  This is a new problem. The current episode started more than 1 week ago. The problem occurs constantly. The problem has been gradually worsening. Pertinent negatives include no chest pain, no abdominal pain, no headaches and no shortness of breath. Nothing aggravates the symptoms. Nothing relieves the symptoms. She has tried nothing for the symptoms.    Past Medical History:  Diagnosis Date  . Arthritis   . Breast cancer ([place]) 1995   LT LUMPECTOMY & s/p XRT  . Depression   . Hx of colonic polyp   . Hypercholesterolemia   . Hypertension   . Melanoma ([place])   . Spinal stenosis    lumbar    Patient Active Problem List   Diagnosis Date Noted  . Lumbar spinal stenosis 01/23/2016  . Hypertension 01/23/2016  . History of depression 01/23/2016  . 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 Right lower quadrant abdominal pain   . Anxiety 10/20/2015  . Loss of weight 10/20/2015  . Burn 07/14/2015  . Hyponatremia 08/07/2014  . Health care maintenance 05/05/2014  . Skin cancer 01/29/2014  . Other malaise and fatigue 10/21/2013  . Skin lesion of cheek 10/21/2013   . Renal cyst 03/22/2013  . Chronic kidney disease, stage III (moderate) 03/14/2013  . Abdominal bruit 03/14/2013  . Spinal stenosis of lumbar region 12/09/2012  . Bulging disc 12/09/2012  . History of colonic polyps 12/09/2012  . History of breast cancer 12/09/2012  . Depression 12/09/2012  . Essential hypertension, benign 12/09/2012  . Hyperlipidemia 12/09/2012    Past Surgical History:  Procedure Laterality Date  . BREAST BIOPSY Left 2006   EXCISIONAL - NEG  . BREAST BIOPSY Left 1995   LT LUMPECTOMY FOR CA  . BREAST LUMPECTOMY Left 1995  . EYE SURGERY Bilateral    cataract excision  . LAPAROSCOPIC APPENDECTOMY N/A 01/11/2016   Procedure: APPENDECTOMY LAPAROSCOPIC;  Surgeon: Dia Crawford III, MD;  Location: [facility] ORS;  Service: General;  Laterality: N/A;    OB History    No data available       Home Medications    Prior to Admission medications   Medication Sig Start Date End Date Taking? Authorizing Provider  aspirin 81 MG tablet Take 81 mg by mouth daily.   Yes Historical Provider, MD  Calcium Carb-Cholecalciferol (CALCIUM-VITAMIN D) 500-200 MG-UNIT tablet Take 2 tablets by mouth.    Yes Historical Provider, MD  clonazePAM (KLONOPIN) 0.5 MG tablet TAKE 1 TABLET BY MOUTH EVERY DAY TO TWICE DAILY AS NEEDED. 12/22/15  Yes Einar Pheasant, MD  lisinopril-hydrochlorothiazide (PRINZIDE,ZESTORETIC) 20-12.5 MG tablet TAKE 1 TABLET BY MOUTH EVERY  DAY 05/11/15  Yes Einar Pheasant, MD  lovastatin (MEVACOR) 40 MG tablet TAKE 1-2 TABLETS BY MOUTH DAILY 07/12/15  Yes Einar Pheasant, MD  mirtazapine (REMERON) 30 MG tablet TAKE 1 TABLET(30 MG) BY MOUTH AT BEDTIME 02/17/16  Yes Einar Pheasant, MD  Multiple Vitamin (MULTIVITAMIN WITH MINERALS) TABS tablet Take 1 tablet by mouth daily.   Yes Historical Provider, MD  Multiple Vitamin (MULTIVITAMIN) LIQD Take 5 mLs by mouth daily.   Yes Historical Provider, MD  pregabalin (LYRICA) 50 MG capsule Take 50 mg by mouth 2 (two) times daily.    Yes  Historical Provider, MD  traMADol (ULTRAM) 50 MG tablet Take 1 tablet (50 mg total) by mouth every 8 (eight) hours as needed. 01/23/16  Yes Hubbard Robinson, MD  ibuprofen (ADVIL,MOTRIN) 800 MG tablet Take 1 tablet by mouth 3 (three) times daily as needed. 01/18/16   Historical Provider, MD  prenatal vitamin w/FE, FA (PRENATAL 1 + 1) 27-1 MG TABS tablet Take 1 tablet by mouth daily at 12 noon.    Historical Provider, MD    Family History Family History  Problem Relation Age of Onset  . Arthritis Mother   . Hyperlipidemia Mother   . Hypertension Mother   . Hyperlipidemia Sister   . Kidney cancer Sister   . Breast cancer Neg Hx     Social History Social History  Substance Use Topics  . Smoking status: Former Smoker    Packs/day: 0.50    Years: 4.00    Types: Cigarettes    Quit date: 12/09/1984  . Smokeless tobacco: Never Used     Comment: quit 12/09/84  . Alcohol use No     Comment: sparingly     Allergies   Contrast media [iodinated diagnostic agents]   Review of Systems Review of Systems  Respiratory: Negative for shortness of breath.   Cardiovascular: Negative for chest pain.  Gastrointestinal: Negative for abdominal pain.  Neurological: Negative for headaches.  All other systems reviewed and are negative.    Physical Exam Triage Vital Signs ED Triage Vitals  Enc Vitals Group     BP 05/08/16 1104 132/83     Pulse Rate 05/08/16 1104 85     Resp 05/08/16 1104 16     Temp 05/08/16 1104 98.5 F (36.9 C)     Temp Source 05/08/16 1104 Oral     SpO2 05/08/16 1104 98 %     Weight 05/08/16 1106 130 lb (59 kg)     Height 05/08/16 1106 5\' 3"  (1.6 m)     Head Circumference --      Peak Flow --      Pain Score 05/08/16 1108 0     Pain Loc --      Pain Edu? --      Excl. in [place]? --    No data found.   Updated Vital Signs BP 132/83 (BP Location: Right Arm)   Pulse 85   Temp 98.5 F (36.9 C) (Oral)   Resp 16   Ht 5\' 3"  (1.6 m)   Wt 130 lb (59 kg)   SpO2  98%   BMI 23.03 kg/m   Visual Acuity Right Eye Distance:   Left Eye Distance:   Bilateral Distance:    Right Eye Near:   Left Eye Near:    Bilateral Near:     Physical Exam  Constitutional: She is oriented to person, place, and time. She appears well-nourished. She is active.  Non-toxic appearance. She does  not have a sickly appearance. She does not appear ill. No distress.  Elderly white female  HENT:  Head: Normocephalic and atraumatic.  Right Ear: Tympanic membrane and external ear normal. A foreign body is present. Decreased hearing is noted.  Left Ear: External ear normal. A foreign body is present. Decreased hearing is noted.  Eyes: EOM are normal. Pupils are equal, round, and reactive to light.  Neck: Normal range of motion. Neck supple.  Pulmonary/Chest: Effort normal.  Musculoskeletal: Normal range of motion.  Lymphadenopathy:    She has no cervical adenopathy.  Neurological: She is alert and oriented to person, place, and time.  Skin: Skin is warm.  Psychiatric: She has a normal mood and affect.  Vitals reviewed.    UC Treatments / Results  Labs (all labs ordered are listed, but only abnormal results are displayed) Labs Reviewed - No data to display  EKG  EKG Interpretation None       Radiology No results found.  Procedures .Ear Cerumen Removal Date/Time: 05/08/2016 12:31 PM Performed by: Frederich Cha Authorized by: Frederich Cha   Consent:    Consent obtained:  Verbal Procedure details:    Location: Both ears were impacted with cerumen left was much worse the right.   Procedure type comment:  During this procedure ears were irrigated by the nurse and cerumen was manually removed by curret by Dr. Alveta Heimlich Post-procedure details:    Inspection:  Bleeding   Patient tolerance of procedure:  Tolerated well, no immediate complications Comments:     Patient had a large black thickened wax ball in the left ear. Using the correct the wax ball was finally  moved some bleeding from ear canal was also noticed but at least with subsequent irrigation it was found and removed out of the left ear canal   (including critical care time)  Medications Ordered in UC Medications - No data to display   Initial Impression / Assessment and Plan / UC Course  I have reviewed the triage vital signs and the nursing notes.  Pertinent labs & imaging results that were available during my care of the patient were reviewed by me and considered in my medical decision making (see chart for details).     Irrigation and crit removal of wax from both ear canals. Follow-up PCP as needed in a week  Final Clinical Impressions(s) / UC Diagnoses   Final diagnoses:  Bilateral impacted cerumen    New Prescriptions New Prescriptions   No medications on file      Note: This dictation was prepared with Dragon dictation along with smaller phrase technology. Any transcriptional errors that result from this process are unintentional.   Frederich Cha, MD 05/08/16 1234

## 2016-05-10 ENCOUNTER — Other Ambulatory Visit: Payer: Self-pay | Admitting: Internal Medicine

## 2016-05-10 NOTE — Telephone Encounter (Signed)
Refilled 12/22/15. Pt last seen 03/30/16. Future appt 07/01/16. Please advise?

## 2016-05-11 ENCOUNTER — Telehealth: Payer: Self-pay | Admitting: *Deleted

## 2016-05-11 NOTE — Telephone Encounter (Signed)
Requested medication refill for :Clonavepam  Pharmacy:[facility] in [place] Please Contact Pt when ready or sent to Pharmacy:  [phone]

## 2016-05-11 NOTE — Telephone Encounter (Signed)
Pt informed faxed to [facility] in [place]

## 2016-05-13 ENCOUNTER — Other Ambulatory Visit: Payer: Self-pay | Admitting: Internal Medicine

## 2016-05-31 DIAGNOSIS — N183 Chronic kidney disease, stage 3 (moderate): Secondary | ICD-10-CM | POA: Diagnosis not present

## 2016-05-31 DIAGNOSIS — I129 Hypertensive chronic kidney disease with stage 1 through stage 4 chronic kidney disease, or unspecified chronic kidney disease: Secondary | ICD-10-CM | POA: Diagnosis not present

## 2016-07-01 ENCOUNTER — Ambulatory Visit (INDEPENDENT_AMBULATORY_CARE_PROVIDER_SITE_OTHER): Payer: PPO | Admitting: Internal Medicine

## 2016-07-01 ENCOUNTER — Encounter: Payer: Self-pay | Admitting: Internal Medicine

## 2016-07-01 VITALS — BP 134/86 | HR 86 | Temp 98.5°F | Resp 14 | Wt 132.4 lb

## 2016-07-01 DIAGNOSIS — I10 Essential (primary) hypertension: Secondary | ICD-10-CM

## 2016-07-01 DIAGNOSIS — N183 Chronic kidney disease, stage 3 unspecified: Secondary | ICD-10-CM

## 2016-07-01 DIAGNOSIS — Z853 Personal history of malignant neoplasm of breast: Secondary | ICD-10-CM | POA: Diagnosis not present

## 2016-07-01 DIAGNOSIS — F419 Anxiety disorder, unspecified: Secondary | ICD-10-CM | POA: Diagnosis not present

## 2016-07-01 DIAGNOSIS — M48061 Spinal stenosis, lumbar region without neurogenic claudication: Secondary | ICD-10-CM | POA: Diagnosis not present

## 2016-07-01 DIAGNOSIS — F329 Major depressive disorder, single episode, unspecified: Secondary | ICD-10-CM | POA: Diagnosis not present

## 2016-07-01 DIAGNOSIS — F32A Depression, unspecified: Secondary | ICD-10-CM

## 2016-07-01 DIAGNOSIS — D72829 Elevated white blood cell count, unspecified: Secondary | ICD-10-CM | POA: Diagnosis not present

## 2016-07-01 NOTE — Progress Notes (Signed)
[name], female   DOB: 05/04/1930, 83 y.o.   MRN: [id number]   Subjective:    [name], female    DOB: 1930-05-31, 83 y.o.   MRN: [id number]  HPI  Patient here for a scheduled follow up.  She saw her nephrologist 05/31/16 (Dr Diana Lester) - f/u CKD.  Felt stable.  Recommended f/u in 18 months.  She tries to stay active.  Reports doing better now.  No chest pain.  No sob.  No acid reflux.  No abdominal pain.  Bowels moving.  Has f/u appt with Dr Diana Lester soon.  Back pain stable.  Feels her anxiety is stable.  Desires no further intervention at this time.     Past Medical History:  Diagnosis Date  . Arthritis   . Breast cancer ([place]) 1995   LT LUMPECTOMY & s/p XRT  . Depression   . Hx of colonic polyp   . Hypercholesterolemia   . Hypertension   . Melanoma ([place])   . Spinal stenosis    lumbar   Past Surgical History:  Procedure Laterality Date  . BREAST BIOPSY Left 2006   EXCISIONAL - NEG  . BREAST BIOPSY Left 1995   LT LUMPECTOMY FOR CA  . BREAST LUMPECTOMY Left 1995  . EYE SURGERY Bilateral    cataract excision  . LAPAROSCOPIC APPENDECTOMY N/A 01/11/2016   Procedure: APPENDECTOMY LAPAROSCOPIC;  Surgeon: Dia Crawford III, MD;  Location: [facility] ORS;  Service: General;  Laterality: N/A;   Family History  Problem Relation Age of Onset  . Arthritis Mother   . Hyperlipidemia Mother   . Hypertension Mother   . Hyperlipidemia Sister   . Kidney cancer Sister   . Breast cancer Neg Hx    Social History   Social History  . Marital status: Widowed    Spouse name: N/A  . Number of children: 2  . Years of education: N/A   Social History Main Topics  . Smoking status: Former Smoker    Packs/day: 0.50    Years: 4.00    Types: Cigarettes    Quit date: 12/09/1984  . Smokeless tobacco: Never Used     Comment: quit 12/09/84  . Alcohol use No     Comment: sparingly  . Drug use: No  . Sexual activity: No   Other Topics Concern  . None   Social History  Narrative  . None    Outpatient Encounter Prescriptions as of 07/01/2016  Medication Sig  . aspirin 81 MG tablet Take 81 mg by mouth daily.  . Calcium Carb-Cholecalciferol (CALCIUM-VITAMIN D) 500-200 MG-UNIT tablet Take 2 tablets by mouth.   . clonazePAM (KLONOPIN) 0.5 MG tablet TAKE 1 TABLET BY MOUTH EVERY DAY TO TWICE DAILY AS NEEDED  . ibuprofen (ADVIL,MOTRIN) 800 MG tablet Take 1 tablet by mouth 3 (three) times daily as needed.  Marland Kitchen lisinopril-hydrochlorothiazide (PRINZIDE,ZESTORETIC) 20-12.5 MG tablet TAKE 1 TABLET BY MOUTH EVERY DAY  . lovastatin (MEVACOR) 40 MG tablet TAKE 1-2 TABLETS BY MOUTH DAILY  . mirtazapine (REMERON) 30 MG tablet TAKE 1 TABLET(30 MG) BY MOUTH AT BEDTIME  . Multiple Vitamin (MULTIVITAMIN WITH MINERALS) TABS tablet Take 1 tablet by mouth daily.  . pregabalin (LYRICA) 50 MG capsule Take 50 mg by mouth 2 (two) times daily.   . traMADol (ULTRAM) 50 MG tablet Take 1 tablet (50 mg total) by mouth every 8 (eight) hours as needed.  . Multiple Vitamin (MULTIVITAMIN) LIQD Take 5 mLs by mouth daily.  Marland Kitchen  prenatal vitamin w/FE, FA (PRENATAL 1 + 1) 27-1 MG TABS tablet Take 1 tablet by mouth daily at 12 noon.   No facility-administered encounter medications on file as of 07/01/2016.     Review of Systems  Constitutional: Negative for appetite change and unexpected weight change.  HENT: Negative for congestion and sinus pressure.   Respiratory: Negative for cough, chest tightness and shortness of breath.   Cardiovascular: Negative for chest pain, palpitations and leg swelling.  Gastrointestinal: Negative for abdominal pain, diarrhea, nausea and vomiting.  Genitourinary: Negative for difficulty urinating and dysuria.  Musculoskeletal: Negative for joint swelling.       Back pain - stable.    Skin: Negative for color change and rash.  Neurological: Negative for dizziness, light-headedness and headaches.  Psychiatric/Behavioral: Negative for agitation and dysphoric mood.        Objective:    Physical Exam  Constitutional: She appears well-developed and well-nourished. No distress.  HENT:  Nose: Nose normal.  Mouth/Throat: Oropharynx is clear and moist.  Neck: Neck supple. No thyromegaly present.  Cardiovascular: Normal rate and regular rhythm.   Pulmonary/Chest: Breath sounds normal. No respiratory distress. She has no wheezes.  Abdominal: Soft. Bowel sounds are normal. There is no tenderness.  Musculoskeletal: She exhibits no edema or tenderness.  Lymphadenopathy:    She has no cervical adenopathy.  Skin: No rash noted. No erythema.  Psychiatric: She has a normal mood and affect. Her behavior is normal.    BP 134/86 (BP Location: Left Arm, Patient Position: Sitting, Cuff Size: Normal)   Pulse 86   Temp 98.5 F (36.9 C) (Oral)   Resp 14   Wt 132 lb 6 oz (60 kg)   SpO2 95%   BMI 23.45 kg/m  Wt Readings from Last 3 Encounters:  07/01/16 132 lb 6 oz (60 kg)  05/08/16 130 lb (59 kg)  03/30/16 131 lb 4 oz (59.5 kg)     Lab Results  Component Value Date   WBC 11.1 (H) 04/13/2016   HGB 14.0 04/13/2016   HCT 43.4 04/13/2016   PLT 268.0 04/13/2016   GLUCOSE 127 (H) 04/13/2016   CHOL 226 (H) 04/13/2016   TRIG 173.0 (H) 04/13/2016   HDL 66.00 04/13/2016   LDLDIRECT 126.7 04/17/2013   LDLCALC 125 (H) 04/13/2016   ALT 12 04/13/2016   AST 19 04/13/2016   NA 138 04/13/2016   K 4.8 04/13/2016   CL 102 04/13/2016   CREATININE 1.51 (H) 04/13/2016   BUN 25 (H) 04/13/2016   CO2 31 04/13/2016   TSH 2.85 12/15/2015       Assessment & Plan:   Problem List Items Addressed This Visit    Anxiety    She feels she is currently doing better.  Declines need for any further intervention at this time.  Follow.        Chronic kidney disease, stage III (moderate)    Followed by Dr Diana Lester.  Evaluated 05/2016.  Felt stable.  Recommended f/u in 18 months.        Depression    Discussed with her today.  She feels she is doing better.  Declines need for any  further intervention.        Essential hypertension, benign    Blood pressure under good control.  Continue same medication regimen.  Follow pressures.  Follow metabolic panel.        Relevant Orders   TSH   Lipid panel   Hepatic function panel   Basic  metabolic panel   History of breast cancer    Mammogram 12/18/15 - Birads I.       Spinal stenosis of lumbar region    Stable.  States has f/u appt with Dr Diana Lester soon.  Follow.         Other Visit Diagnoses    Leukocytosis, unspecified type    -  Primary   Relevant Orders   CBC with Differential/Platelet       Einar Pheasant, MD

## 2016-07-01 NOTE — Progress Notes (Signed)
Pre visit review using our clinic review tool, if applicable. No additional management support is needed unless otherwise documented below in the visit note. 

## 2016-07-10 ENCOUNTER — Encounter: Payer: Self-pay | Admitting: Internal Medicine

## 2016-07-10 NOTE — Assessment & Plan Note (Signed)
Mammogram 12/18/15 - Birads I.

## 2016-07-10 NOTE — Assessment & Plan Note (Signed)
Blood pressure under good control.  Continue same medication regimen.  Follow pressures.  Follow metabolic panel.   

## 2016-07-10 NOTE — Assessment & Plan Note (Signed)
Followed by Dr Johnney Ou.  Evaluated 05/2016.  Felt stable.  Recommended f/u in 18 months.

## 2016-07-10 NOTE — Assessment & Plan Note (Signed)
Discussed with her today.  She feels she is doing better.  Declines need for any further intervention.

## 2016-07-10 NOTE — Assessment & Plan Note (Signed)
Stable.  States has f/u appt with Dr Sharlet Salina soon.  Follow.

## 2016-07-10 NOTE — Assessment & Plan Note (Signed)
She feels she is currently doing better.  Declines need for any further intervention at this time.  Follow.

## 2016-07-12 DIAGNOSIS — M5416 Radiculopathy, lumbar region: Secondary | ICD-10-CM | POA: Diagnosis not present

## 2016-07-12 DIAGNOSIS — M48062 Spinal stenosis, lumbar region with neurogenic claudication: Secondary | ICD-10-CM | POA: Diagnosis not present

## 2016-08-14 ENCOUNTER — Other Ambulatory Visit: Payer: Self-pay | Admitting: Internal Medicine

## 2016-08-16 DIAGNOSIS — Z86018 Personal history of other benign neoplasm: Secondary | ICD-10-CM | POA: Diagnosis not present

## 2016-08-16 DIAGNOSIS — Z872 Personal history of diseases of the skin and subcutaneous tissue: Secondary | ICD-10-CM | POA: Diagnosis not present

## 2016-08-16 DIAGNOSIS — Z859 Personal history of malignant neoplasm, unspecified: Secondary | ICD-10-CM | POA: Diagnosis not present

## 2016-08-16 DIAGNOSIS — L570 Actinic keratosis: Secondary | ICD-10-CM | POA: Diagnosis not present

## 2016-08-16 DIAGNOSIS — L853 Xerosis cutis: Secondary | ICD-10-CM | POA: Diagnosis not present

## 2016-08-16 DIAGNOSIS — L821 Other seborrheic keratosis: Secondary | ICD-10-CM | POA: Diagnosis not present

## 2016-08-16 DIAGNOSIS — Z85820 Personal history of malignant melanoma of skin: Secondary | ICD-10-CM | POA: Diagnosis not present

## 2016-08-16 DIAGNOSIS — Z85828 Personal history of other malignant neoplasm of skin: Secondary | ICD-10-CM | POA: Diagnosis not present

## 2016-08-16 NOTE — Telephone Encounter (Signed)
Patient needs refills  Last o/v 4/26 No f/u at this time

## 2016-08-17 NOTE — Telephone Encounter (Signed)
ok'd refill for clonazepam and remeron.  Please schedule a f/u appt.

## 2016-08-17 NOTE — Telephone Encounter (Signed)
Called pt informed refills sent f/u app has been made at this time with her.

## 2016-10-07 DIAGNOSIS — M48062 Spinal stenosis, lumbar region with neurogenic claudication: Secondary | ICD-10-CM | POA: Diagnosis not present

## 2016-10-07 DIAGNOSIS — M5136 Other intervertebral disc degeneration, lumbar region: Secondary | ICD-10-CM | POA: Diagnosis not present

## 2016-10-07 DIAGNOSIS — M5416 Radiculopathy, lumbar region: Secondary | ICD-10-CM | POA: Diagnosis not present

## 2016-10-21 ENCOUNTER — Encounter: Payer: Self-pay | Admitting: Internal Medicine

## 2016-10-21 ENCOUNTER — Ambulatory Visit (INDEPENDENT_AMBULATORY_CARE_PROVIDER_SITE_OTHER): Payer: PPO | Admitting: Internal Medicine

## 2016-10-21 DIAGNOSIS — N183 Chronic kidney disease, stage 3 unspecified: Secondary | ICD-10-CM

## 2016-10-21 DIAGNOSIS — M48061 Spinal stenosis, lumbar region without neurogenic claudication: Secondary | ICD-10-CM | POA: Diagnosis not present

## 2016-10-21 DIAGNOSIS — N281 Cyst of kidney, acquired: Secondary | ICD-10-CM | POA: Diagnosis not present

## 2016-10-21 DIAGNOSIS — I10 Essential (primary) hypertension: Secondary | ICD-10-CM | POA: Diagnosis not present

## 2016-10-21 DIAGNOSIS — E785 Hyperlipidemia, unspecified: Secondary | ICD-10-CM

## 2016-10-21 DIAGNOSIS — F32A Depression, unspecified: Secondary | ICD-10-CM

## 2016-10-21 DIAGNOSIS — F419 Anxiety disorder, unspecified: Secondary | ICD-10-CM | POA: Diagnosis not present

## 2016-10-21 DIAGNOSIS — F329 Major depressive disorder, single episode, unspecified: Secondary | ICD-10-CM | POA: Diagnosis not present

## 2016-10-21 NOTE — Progress Notes (Signed)
Pre-visit discussion using our clinic review tool. No additional management support is needed unless otherwise documented below in the visit note.  

## 2016-10-21 NOTE — Progress Notes (Signed)
[name], female   DOB: 1931/03/01, 83 y.o.   MRN: [id number]   Subjective:    [name], female    DOB: [birth year], 83 y.o.   MRN: [id number]  HPI  Patient here for a scheduled follow up.  She reports she is doing relatively well.  Some intermittent increased anxiety.  Worse at times.  Overall she feels she is handling things relatively well.  Sees Dr Sharlet Salina for her back.  Stable.  No chest pain.  No sob.  No acid reflux.  No abdominal pain.  Bowels moving.  No urine change.     Past Medical History:  Diagnosis Date  . Arthritis   . Breast cancer ([place]) 1995   LT LUMPECTOMY & s/p XRT  . Depression   . Hx of colonic polyp   . Hypercholesterolemia   . Hypertension   . Melanoma ([place])   . Spinal stenosis    lumbar   Past Surgical History:  Procedure Laterality Date  . BREAST BIOPSY Left 2006   EXCISIONAL - NEG  . BREAST BIOPSY Left 1995   LT LUMPECTOMY FOR CA  . BREAST LUMPECTOMY Left 1995  . EYE SURGERY Bilateral    cataract excision  . LAPAROSCOPIC APPENDECTOMY N/A 01/11/2016   Procedure: APPENDECTOMY LAPAROSCOPIC;  Surgeon: Dia Crawford III, MD;  Location: [facility] ORS;  Service: General;  Laterality: N/A;   Family History  Problem Relation Age of Onset  . Arthritis Mother   . Hyperlipidemia Mother   . Hypertension Mother   . Hyperlipidemia Sister   . Kidney cancer Sister   . Breast cancer Neg Hx    Social History   Social History  . Marital status: Widowed    Spouse name: N/A  . Number of children: 2  . Years of education: N/A   Social History Main Topics  . Smoking status: Former Smoker    Packs/day: 0.50    Years: 4.00    Types: Cigarettes    Quit date: 12/09/1984  . Smokeless tobacco: Never Used     Comment: quit 12/09/84  . Alcohol use No     Comment: sparingly  . Drug use: No  . Sexual activity: No   Other Topics Concern  . None   Social History Narrative  . None    Outpatient Encounter Prescriptions as of  10/21/2016  Medication Sig  . aspirin 81 MG tablet Take 81 mg by mouth daily.  . Calcium Carb-Cholecalciferol (CALCIUM-VITAMIN D) 500-200 MG-UNIT tablet Take 2 tablets by mouth.   . clonazePAM (KLONOPIN) 0.5 MG tablet TAKE 1 TABLET BY MOUTH ONCE TO TWICE DAILY AS NEEDED  . lisinopril-hydrochlorothiazide (PRINZIDE,ZESTORETIC) 20-12.5 MG tablet TAKE 1 TABLET BY MOUTH EVERY DAY  . lovastatin (MEVACOR) 40 MG tablet TAKE 1-2 TABLETS BY MOUTH DAILY  . mirtazapine (REMERON) 30 MG tablet TAKE 1 TABLET(30 MG) BY MOUTH AT BEDTIME  . Multiple Vitamin (MULTIVITAMIN) LIQD Take 5 mLs by mouth daily.  . pregabalin (LYRICA) 50 MG capsule Take 50 mg by mouth 2 (two) times daily.   . traMADol (ULTRAM) 50 MG tablet Take 1 tablet (50 mg total) by mouth every 8 (eight) hours as needed.  . [DISCONTINUED] ibuprofen (ADVIL,MOTRIN) 800 MG tablet Take 1 tablet by mouth 3 (three) times daily as needed.  . [DISCONTINUED] Multiple Vitamin (MULTIVITAMIN WITH MINERALS) TABS tablet Take 1 tablet by mouth daily.  . [DISCONTINUED] prenatal vitamin w/FE, FA (PRENATAL 1 + 1) 27-1 MG TABS tablet Take 1  tablet by mouth daily at 12 noon.   No facility-administered encounter medications on file as of 10/21/2016.     Review of Systems  Constitutional: Negative for appetite change and unexpected weight change.  HENT: Negative for congestion and sinus pressure.   Respiratory: Negative for cough, chest tightness and shortness of breath.   Cardiovascular: Negative for chest pain, palpitations and leg swelling.  Gastrointestinal: Negative for abdominal pain, diarrhea, nausea and vomiting.  Genitourinary: Negative for difficulty urinating and dysuria.  Musculoskeletal: Negative for back pain and joint swelling.  Skin: Negative for color change and rash.  Neurological: Negative for dizziness, light-headedness and headaches.  Psychiatric/Behavioral: Negative for agitation and dysphoric mood.       Increased anxiety as outlined.  Overall  stable.        Objective:    Physical Exam  Constitutional: She appears well-developed and well-nourished. No distress.  HENT:  Nose: Nose normal.  Mouth/Throat: Oropharynx is clear and moist.  Neck: Neck supple. No thyromegaly present.  Cardiovascular: Normal rate and regular rhythm.   Pulmonary/Chest: Breath sounds normal. No respiratory distress. She has no wheezes.  Abdominal: Soft. Bowel sounds are normal. There is no tenderness.  Musculoskeletal: She exhibits no edema or tenderness.  Lymphadenopathy:    She has no cervical adenopathy.  Skin: No rash noted. No erythema.  Psychiatric: She has a normal mood and affect. Her behavior is normal.    BP 138/86 (BP Location: Left Arm, Patient Position: Sitting, Cuff Size: Normal)   Pulse 91   Temp 99 F (37.2 C) (Oral)   Resp 12   Ht 5\' 3"  (1.6 m)   Wt 137 lb 9.6 oz (62.4 kg)   SpO2 95%   BMI 24.37 kg/m  Wt Readings from Last 3 Encounters:  10/21/16 137 lb 9.6 oz (62.4 kg)  07/01/16 132 lb 6 oz (60 kg)  05/08/16 130 lb (59 kg)     Lab Results  Component Value Date   WBC 11.1 (H) 04/13/2016   HGB 14.0 04/13/2016   HCT 43.4 04/13/2016   PLT 268.0 04/13/2016   GLUCOSE 127 (H) 04/13/2016   CHOL 226 (H) 04/13/2016   TRIG 173.0 (H) 04/13/2016   HDL 66.00 04/13/2016   LDLDIRECT 126.7 04/17/2013   LDLCALC 125 (H) 04/13/2016   ALT 12 04/13/2016   AST 19 04/13/2016   NA 138 04/13/2016   K 4.8 04/13/2016   CL 102 04/13/2016   CREATININE 1.51 (H) 04/13/2016   BUN 25 (H) 04/13/2016   CO2 31 04/13/2016   TSH 2.85 12/15/2015       Assessment & Plan:   Problem List Items Addressed This Visit    Anxiety    She feels things are stable.  On remeron and clonazepam.  Follow.       Chronic kidney disease, stage III (moderate)    Followed by Dr Johnney Ou.  Evaluated 05/2016.  Recommended f/u in 18 months.        Depression    Discussed with her today.  She feels things are overall stable.  Does not feel needs any further  intervention.  Follow.        Hyperlipidemia    Low cholesterol diet and exercise.  Follow lipid panel and liver function tests.  On lovastatin.        Hypertension    Blood pressure under good control.  Continue same medication regimen.  Follow pressures.  Follow metabolic panel.        Renal cyst  Saw nephrology 05/2016.  Per their note, not seen on ultrasound recently.  Follow.        Spinal stenosis of lumbar region    Followed by Dr Sharlet Salina.  Stable.            Einar Pheasant, MD

## 2016-10-24 ENCOUNTER — Encounter: Payer: Self-pay | Admitting: Internal Medicine

## 2016-10-24 NOTE — Assessment & Plan Note (Signed)
Blood pressure under good control.  Continue same medication regimen.  Follow pressures.  Follow metabolic panel.   

## 2016-10-24 NOTE — Assessment & Plan Note (Signed)
Followed by Dr Chasnis.  Stable.   

## 2016-10-24 NOTE — Assessment & Plan Note (Signed)
Saw nephrology 05/2016.  Per their note, not seen on ultrasound recently.  Follow.

## 2016-10-24 NOTE — Assessment & Plan Note (Signed)
Discussed with her today.  She feels things are overall stable.  Does not feel needs any further intervention.  Follow.

## 2016-10-24 NOTE — Assessment & Plan Note (Signed)
She feels things are stable.  On remeron and clonazepam.  Follow.

## 2016-10-24 NOTE — Assessment & Plan Note (Signed)
Followed by Dr Johnney Ou.  Evaluated 05/2016.  Recommended f/u in 18 months.

## 2016-10-24 NOTE — Assessment & Plan Note (Addendum)
Low cholesterol diet and exercise.  Follow lipid panel and liver function tests.  On lovastatin.

## 2016-10-29 DIAGNOSIS — H1132 Conjunctival hemorrhage, left eye: Secondary | ICD-10-CM | POA: Diagnosis not present

## 2016-11-09 ENCOUNTER — Other Ambulatory Visit: Payer: Self-pay | Admitting: Internal Medicine

## 2016-11-09 DIAGNOSIS — Z1231 Encounter for screening mammogram for malignant neoplasm of breast: Secondary | ICD-10-CM

## 2016-11-12 ENCOUNTER — Other Ambulatory Visit: Payer: Self-pay | Admitting: Internal Medicine

## 2016-11-12 NOTE — Telephone Encounter (Signed)
Please advise for refill, last OV was 8/16. Thanks

## 2016-11-29 DIAGNOSIS — M5416 Radiculopathy, lumbar region: Secondary | ICD-10-CM | POA: Diagnosis not present

## 2016-11-29 DIAGNOSIS — M48062 Spinal stenosis, lumbar region with neurogenic claudication: Secondary | ICD-10-CM | POA: Diagnosis not present

## 2016-11-29 DIAGNOSIS — M5136 Other intervertebral disc degeneration, lumbar region: Secondary | ICD-10-CM | POA: Diagnosis not present

## 2016-12-15 ENCOUNTER — Other Ambulatory Visit: Payer: Self-pay | Admitting: Physical Medicine and Rehabilitation

## 2016-12-15 DIAGNOSIS — M5416 Radiculopathy, lumbar region: Secondary | ICD-10-CM

## 2016-12-17 ENCOUNTER — Ambulatory Visit
Admission: RE | Admit: 2016-12-17 | Discharge: 2016-12-17 | Disposition: A | Discharge status: Home / Self Care | Payer: PPO | Source: Ambulatory Visit | Attending: Internal Medicine | Admitting: Internal Medicine

## 2016-12-17 DIAGNOSIS — Z1231 Encounter for screening mammogram for malignant neoplasm of breast: Secondary | ICD-10-CM | POA: Diagnosis not present

## 2016-12-21 ENCOUNTER — Ambulatory Visit: Payer: PPO

## 2016-12-27 ENCOUNTER — Ambulatory Visit
Admission: RE | Admit: 2016-12-27 | Discharge: 2016-12-27 | Disposition: A | Discharge status: Home / Self Care | Payer: PPO | Source: Ambulatory Visit | Attending: Physical Medicine and Rehabilitation | Admitting: Physical Medicine and Rehabilitation

## 2016-12-27 DIAGNOSIS — M5416 Radiculopathy, lumbar region: Secondary | ICD-10-CM | POA: Diagnosis not present

## 2016-12-27 DIAGNOSIS — M545 Low back pain: Secondary | ICD-10-CM | POA: Diagnosis not present

## 2016-12-27 DIAGNOSIS — M48061 Spinal stenosis, lumbar region without neurogenic claudication: Secondary | ICD-10-CM | POA: Insufficient documentation

## 2016-12-28 ENCOUNTER — Other Ambulatory Visit: Payer: Self-pay | Admitting: Internal Medicine

## 2016-12-29 NOTE — Telephone Encounter (Signed)
Last OV was 10/21/16, last refill was 08/17/16, #135 with no refills, please advise, thanks

## 2016-12-29 NOTE — Telephone Encounter (Signed)
Last OV 10/21/2016 Next OV 01/25/2017 Last refill 08/17/2016

## 2017-01-04 DIAGNOSIS — M545 Low back pain: Secondary | ICD-10-CM | POA: Diagnosis not present

## 2017-01-04 DIAGNOSIS — M48061 Spinal stenosis, lumbar region without neurogenic claudication: Secondary | ICD-10-CM | POA: Diagnosis not present

## 2017-01-04 DIAGNOSIS — M419 Scoliosis, unspecified: Secondary | ICD-10-CM | POA: Diagnosis not present

## 2017-01-04 DIAGNOSIS — G8929 Other chronic pain: Secondary | ICD-10-CM | POA: Diagnosis not present

## 2017-01-17 DIAGNOSIS — M545 Low back pain: Secondary | ICD-10-CM | POA: Diagnosis not present

## 2017-01-17 DIAGNOSIS — G8929 Other chronic pain: Secondary | ICD-10-CM | POA: Diagnosis not present

## 2017-01-19 ENCOUNTER — Other Ambulatory Visit: Payer: PPO

## 2017-01-20 ENCOUNTER — Other Ambulatory Visit: Payer: PPO

## 2017-01-25 ENCOUNTER — Ambulatory Visit: Payer: PPO | Admitting: Internal Medicine

## 2017-01-25 ENCOUNTER — Telehealth: Payer: Self-pay | Admitting: Internal Medicine

## 2017-01-25 DIAGNOSIS — G8929 Other chronic pain: Secondary | ICD-10-CM | POA: Diagnosis not present

## 2017-01-25 DIAGNOSIS — M545 Low back pain: Secondary | ICD-10-CM | POA: Diagnosis not present

## 2017-01-25 NOTE — Telephone Encounter (Signed)
Reviewed concerns.  Can schedule an appt to discuss.  Please notify daughter.

## 2017-01-25 NOTE — Telephone Encounter (Signed)
Left message to return call to our office for daughter.   

## 2017-01-25 NOTE — Telephone Encounter (Signed)
Please advise 

## 2017-01-25 NOTE — Telephone Encounter (Signed)
Spoke to daughter informed she is going to see mom over the weekend and they will decide when a good time to come in will be. She will call office next week to set up appointment.

## 2017-01-25 NOTE — Telephone Encounter (Signed)
Copied from [place] [id number]. Topic: Quick Communication - See Telephone Encounter >> Jan 25, 2017 10:13 AM Percell Belt A wrote: CRM for notification. See Telephone encounter for:  01/25/17.   Pt Daughter called in and has some concernes about her mother and just wanted to send a fyi to Dr Nicki Reaper. She went over to visit mother yesterday and they had the same 4 conversation over and over. She said that her and her brother have noticed her  memory getting worse daily. She also stated that her diet consent of a lot of icecream and not enough protein. They would like to if there is anything during her next visit that can be done to test memory or progress of her memory?   [place] in [place]. Ok to speak with.  Best number to reach her if any questions is [phone]

## 2017-01-27 IMAGING — MG MM DIGITAL DIAGNOSTIC UNILAT*R* W/ TOMO W/ CAD
6 series · 6 of 14 positions shown · non-contrast
Comparison: Previous exam(s).

CLINICAL DATA: Callback from screening mammogram for a possible
asymmetry right breast

EXAM:
2D DIGITAL DIAGNOSTIC UNILATERAL RIGHT MAMMOGRAM WITH CAD AND
ADJUNCT TOMO

[R MLO]
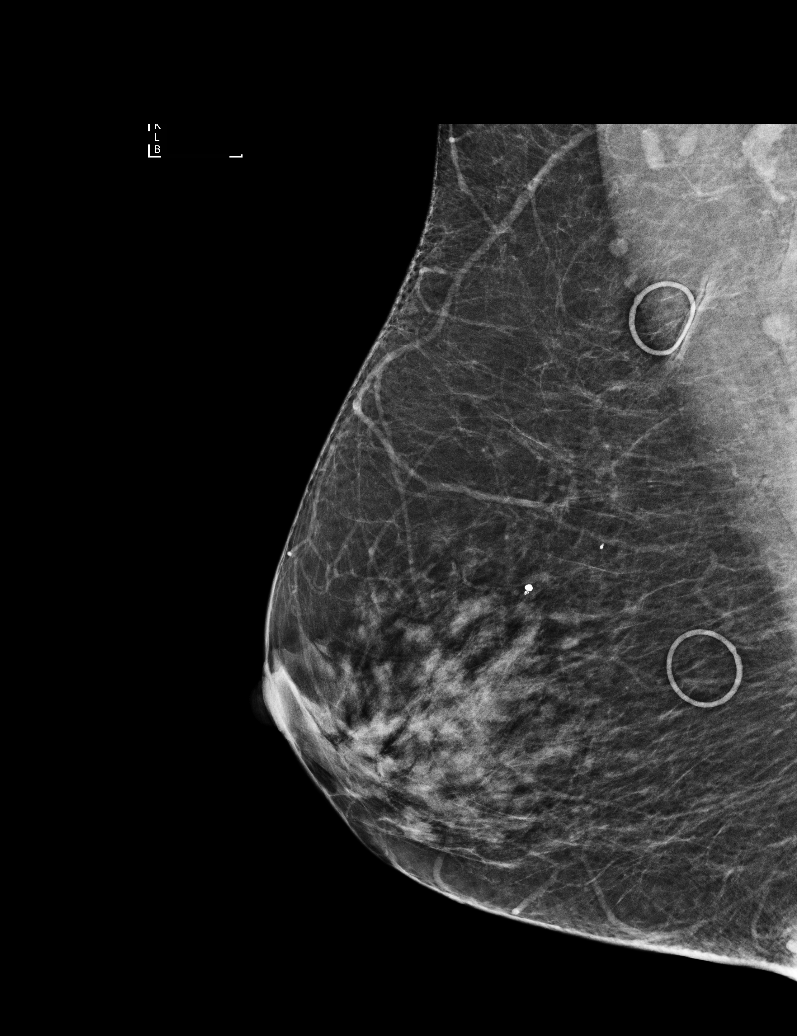

[R CC]
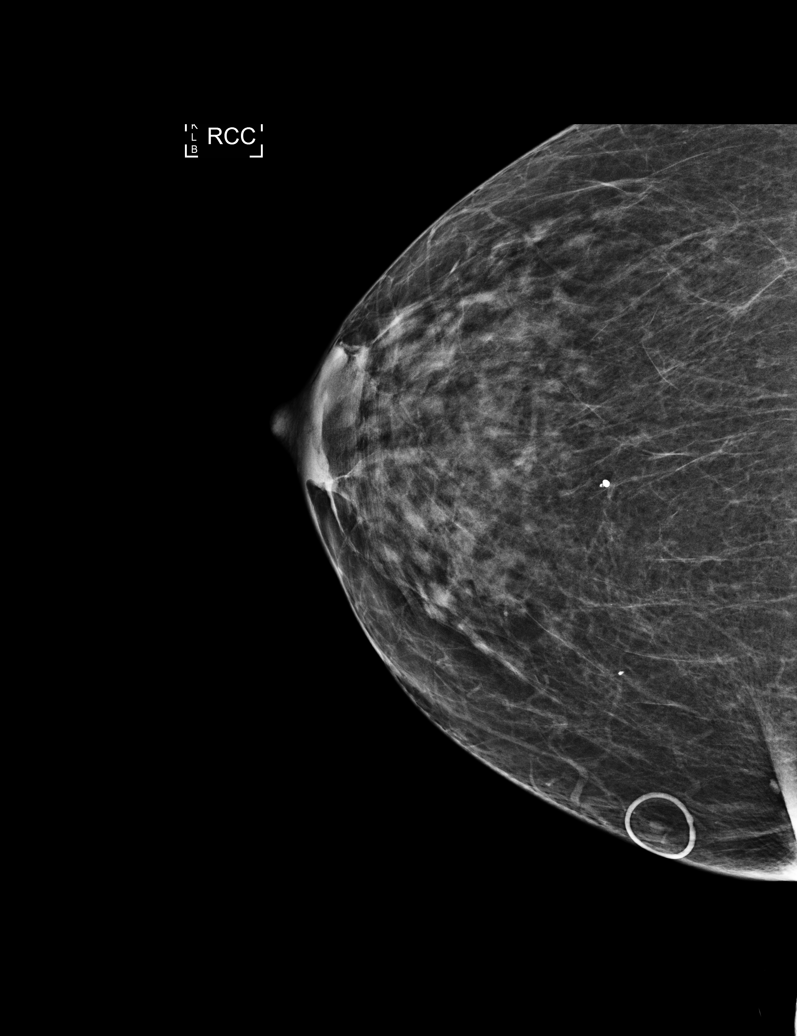

[R MLO synth-2D]
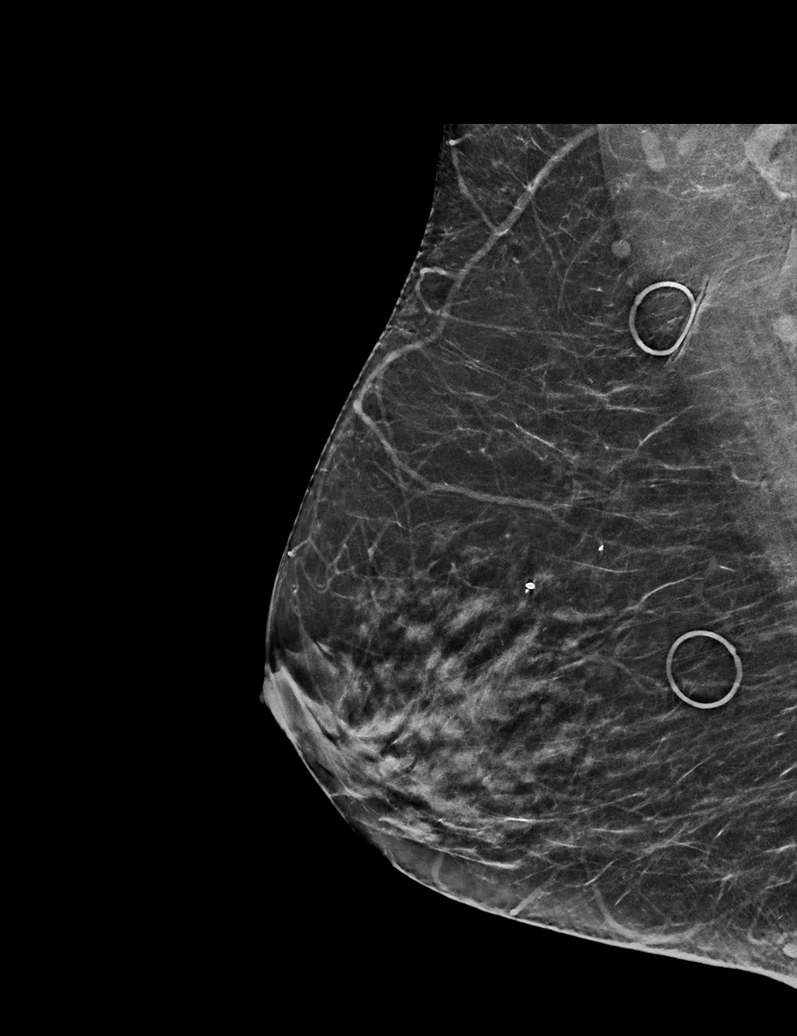

[R CC synth-2D]
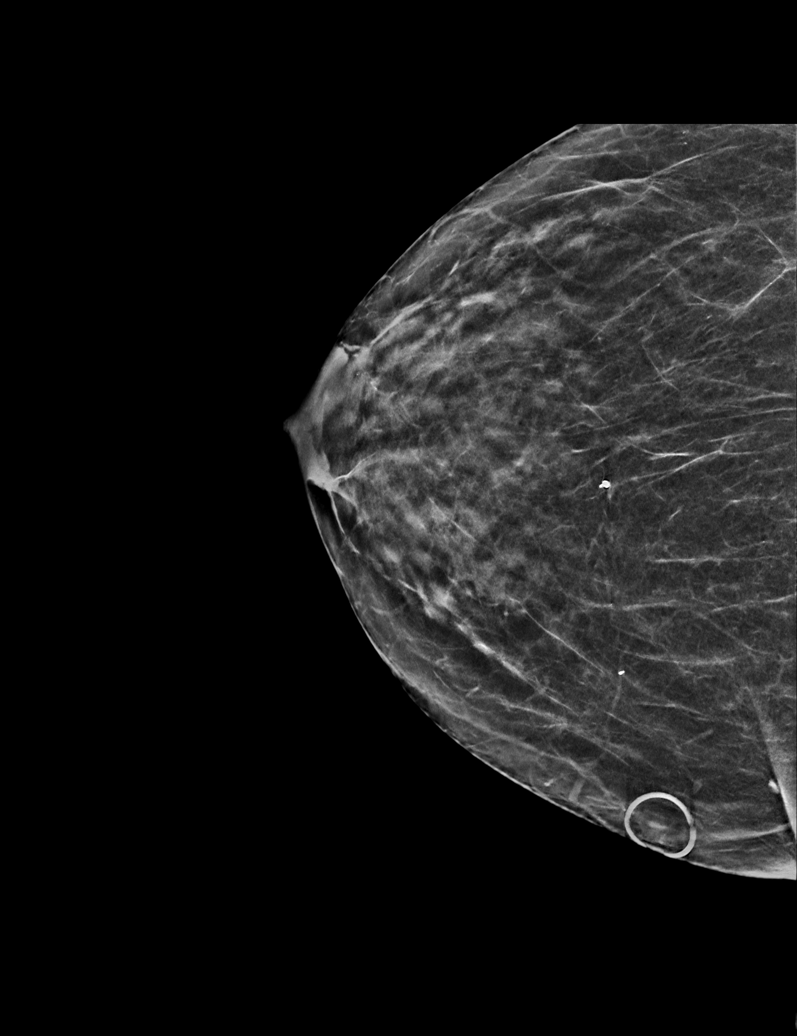

[R CC tomo · tomo slice 25/50.0]
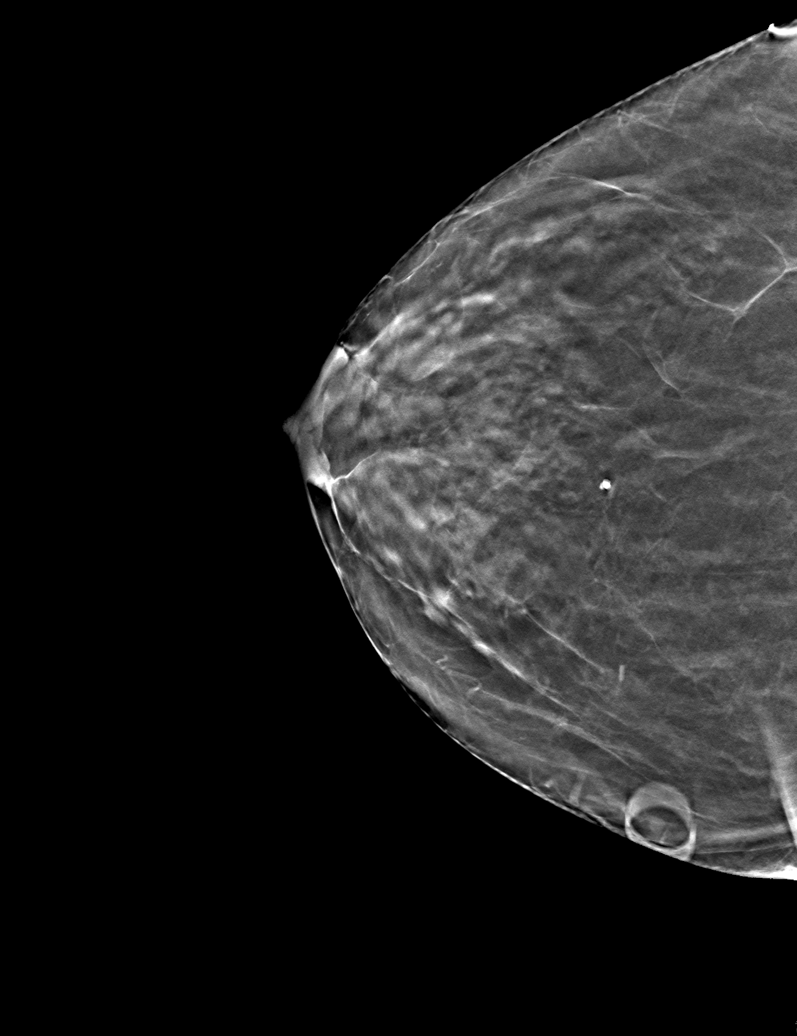

[R MLO tomo · tomo slice 27/52.0]
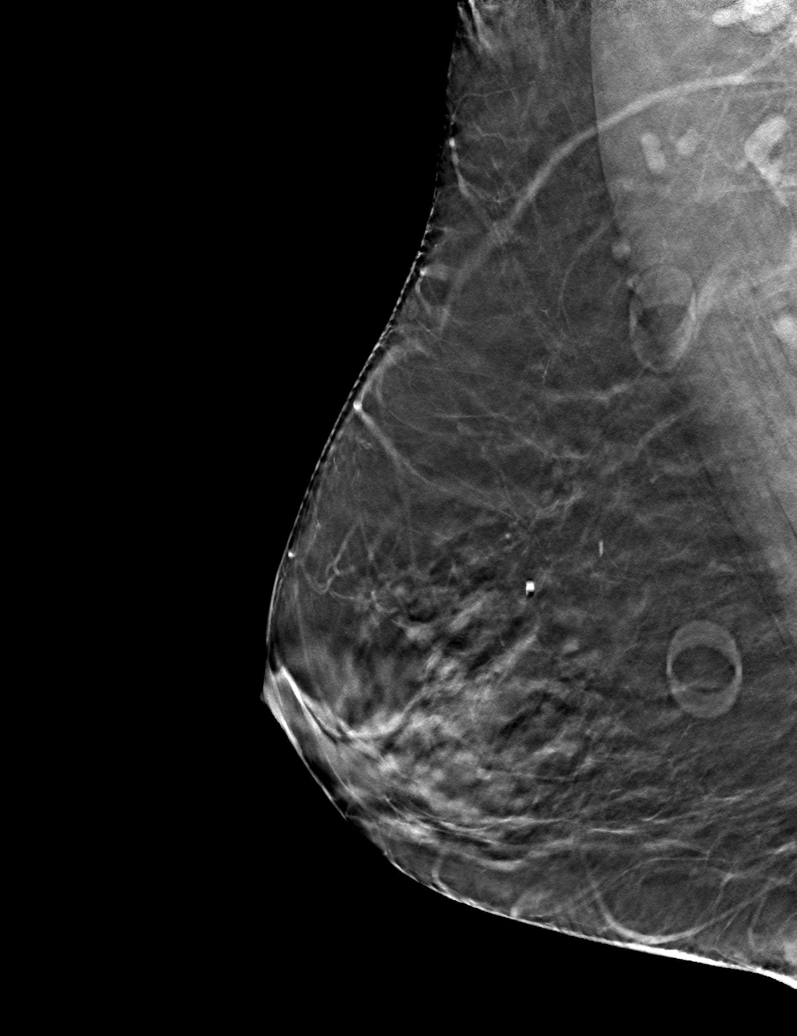

[6 of 14 positions shown; findings below may reference images not displayed]

ACR Breast Density Category b: There are scattered areas of
fibroglandular density.
FINDINGS: Cc and MLO views of the right breast are submitted. Previously
questioned asymmetry is due to superimposed loop of blood vessel. No
suspicious abnormalities identified in the right breast.

Mammographic images were processed with CAD.
IMPRESSION: Negative.

RECOMMENDATION:
Routine screening mammogram back on schedule.

I have discussed the findings and recommendations with the patient.
Results were also provided in writing at the conclusion of the
visit. If applicable, a reminder letter will be sent to the patient
regarding the next appointment.

BI-RADS CATEGORY  1: Negative.

## 2017-02-01 ENCOUNTER — Ambulatory Visit: Payer: PPO | Admitting: Internal Medicine

## 2017-02-01 DIAGNOSIS — L821 Other seborrheic keratosis: Secondary | ICD-10-CM | POA: Diagnosis not present

## 2017-02-09 DIAGNOSIS — M47816 Spondylosis without myelopathy or radiculopathy, lumbar region: Secondary | ICD-10-CM | POA: Diagnosis not present

## 2017-02-12 ENCOUNTER — Other Ambulatory Visit: Payer: Self-pay | Admitting: Internal Medicine

## 2017-02-26 ENCOUNTER — Ambulatory Visit
Admission: EM | Admit: 2017-02-26 | Discharge: 2017-02-26 | Disposition: A | Discharge status: Home / Self Care | Payer: PPO | Attending: Family Medicine | Admitting: Family Medicine

## 2017-02-26 ENCOUNTER — Other Ambulatory Visit: Payer: Self-pay

## 2017-02-26 DIAGNOSIS — J040 Acute laryngitis: Secondary | ICD-10-CM

## 2017-02-26 NOTE — ED Provider Notes (Signed)
[facility]    CSN: [id number] Arrival date & time: 02/26/17  1248     History   Chief Complaint Chief Complaint  Patient presents with  . Laryngitis    HPI Diana Lester is a 83 y.o. female.   HPI   Is an 83 year old female who presents with the sudden loss of her voice occurring yesterday.  She denies any recent illness.  She denies any discomfort.  Has had no upper respiratory illness symptoms.  Denies any fever chills.  She has a remote past history of smoking but it varies minimal amount.       Past Medical History:  Diagnosis Date  . Arthritis   . Breast cancer ([place]) 1995   LT LUMPECTOMY & s/p XRT  . Depression   . Hx of colonic polyp   . Hypercholesterolemia   . Hypertension   . Melanoma ([place])   . Spinal stenosis    lumbar    Patient Active Problem List   Diagnosis Date Noted  . Lumbar spinal stenosis 01/23/2016  . Hypertension 01/23/2016  . History of depression 01/23/2016  . 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 Right lower quadrant abdominal pain   . Anxiety 10/20/2015  . Loss of weight 10/20/2015  . Burn 07/14/2015  . Hyponatremia 08/07/2014  . Health care maintenance 05/05/2014  . Skin cancer 01/29/2014  . Other malaise and fatigue 10/21/2013  . Skin lesion of cheek 10/21/2013  . Renal cyst 03/22/2013  . Chronic kidney disease, stage III (moderate) ([place]) 03/14/2013  . Abdominal bruit 03/14/2013  . Spinal stenosis of lumbar region 12/09/2012  . Bulging disc 12/09/2012  . History of colonic polyps 12/09/2012  . History of breast cancer 12/09/2012  . Depression 12/09/2012  . Essential hypertension, benign 12/09/2012  . Hyperlipidemia 12/09/2012    Past Surgical History:  Procedure Laterality Date  . BREAST BIOPSY Left 2006   EXCISIONAL - NEG  . BREAST BIOPSY Left 1995   LT LUMPECTOMY FOR CA  . BREAST LUMPECTOMY Left 1995  . EYE SURGERY Bilateral    cataract excision  . LAPAROSCOPIC APPENDECTOMY N/A 01/11/2016   Procedure: APPENDECTOMY LAPAROSCOPIC;  Surgeon: Dia Crawford III, MD;  Location: [facility] ORS;  Service: General;  Laterality: N/A;    OB History    No data available       Home Medications    Prior to Admission medications   Medication Sig Start Date End Date Taking? Authorizing Provider  aspirin 81 MG tablet Take 81 mg by mouth daily.    [provider]  Calcium Carb-Cholecalciferol (CALCIUM-VITAMIN D) 500-200 MG-UNIT tablet Take 2 tablets by mouth.     [provider]  clonazePAM (KLONOPIN) 0.5 MG tablet TAKE 1 TABLET BY MOUTH 1 TO 2 TIMES DAILY AS NEEDED 12/29/16   Einar Pheasant, MD  lisinopril-hydrochlorothiazide (PRINZIDE,ZESTORETIC) 20-12.5 MG tablet TAKE 1 TABLET BY MOUTH EVERY DAY 02/16/17   Einar Pheasant, MD  lovastatin (MEVACOR) 40 MG tablet TAKE 1-2 TABLETS BY MOUTH DAILY 07/12/15   Einar Pheasant, MD  mirtazapine (REMERON) 30 MG tablet TAKE 1 TABLET(30 MG) BY MOUTH AT BEDTIME 11/12/16   Einar Pheasant, MD  Multiple Vitamin (MULTIVITAMIN) LIQD Take 5 mLs by mouth daily.    [provider]  pregabalin (LYRICA) 50 MG capsule Take 50 mg by mouth 2 (two) times daily.     [provider]  traMADol (ULTRAM) 50 MG tablet Take 1 tablet (50 mg total) by mouth every 8 (eight) hours as needed.  01/23/16   Hubbard Robinson, MD    Family History Family History  Problem Relation Age of Onset  . Arthritis Mother   . Hyperlipidemia Mother   . Hypertension Mother   . Hyperlipidemia Sister   . Kidney cancer Sister   . Breast cancer Neg Hx     Social History Social History   Tobacco Use  . Smoking status: Former Smoker    Packs/day: 0.50    Years: 4.00    Pack years: 2.00    Types: Cigarettes    Last attempt to quit: 12/09/1984    Years since quitting: 32.2  . Smokeless tobacco: Never Used  . Tobacco comment: quit 12/09/84  Substance Use Topics  . Alcohol use: No    Alcohol/week: 0.0 oz    Comment: sparingly  . Drug use: No      Allergies   Contrast media [iodinated diagnostic agents]   Review of Systems Review of Systems  Constitutional: Negative for activity change, appetite change, chills, fatigue and fever.  HENT: Positive for voice change.   All other systems reviewed and are negative.    Physical Exam Triage Vital Signs ED Triage Vitals  Enc Vitals Group     BP 02/26/17 1329 130/76     Pulse Rate 02/26/17 1329 77     Resp 02/26/17 1329 16     Temp 02/26/17 1329 98.3 F (36.8 C)     Temp Source 02/26/17 1329 Oral     SpO2 02/26/17 1329 99 %     Weight 02/26/17 1326 136 lb (61.7 kg)     Height 02/26/17 1326 5\' 3"  (1.6 m)     Head Circumference --      Peak Flow --      Pain Score --      Pain Loc --      Pain Edu? --      Excl. in [place]? --    No data found.  Updated Vital Signs BP 130/76 (BP Location: Right Arm)   Pulse 77   Temp 98.3 F (36.8 C) (Oral)   Resp 16   Ht 5\' 3"  (1.6 m)   Wt 136 lb (61.7 kg)   SpO2 99%   BMI 24.09 kg/m   Visual Acuity Right Eye Distance:   Left Eye Distance:   Bilateral Distance:    Right Eye Near:   Left Eye Near:    Bilateral Near:     Physical Exam  Constitutional: She is oriented to person, place, and time. She appears well-developed and well-nourished. No distress.  HENT:  Head: Normocephalic.  Right Ear: External ear normal.  Left Ear: External ear normal.  Nose: Nose normal.  Mouth/Throat: Oropharynx is clear and moist. No oropharyngeal exudate.  Eyes: Pupils are equal, round, and reactive to light. Right eye exhibits no discharge. Left eye exhibits no discharge.  Neck: Normal range of motion. No tracheal deviation present. No thyromegaly present.  Pulmonary/Chest: Effort normal and breath sounds normal.  Musculoskeletal: Normal range of motion.  Lymphadenopathy:    She has no cervical adenopathy.  Neurological: She is alert and oriented to person, place, and time.  Skin: Skin is warm and dry. She is not diaphoretic.   Psychiatric: She has a normal mood and affect. Her behavior is normal. Judgment and thought content normal.  Nursing note and vitals reviewed.    UC Treatments / Results  Labs (all labs ordered are listed, but only abnormal results are displayed) Labs Reviewed - No  data to display  EKG  EKG Interpretation None       Radiology No results found.  Procedures Procedures (including critical care time)  Medications Ordered in UC Medications - No data to display   Initial Impression / Assessment and Plan / UC Course  I have reviewed the triage vital signs and the nursing notes.  Pertinent labs & imaging results that were available during my care of the patient were reviewed by me and considered in my medical decision making (see chart for details).     Plan: 1. Test/x-ray results and diagnosis reviewed with patient 2. rx as per orders; risks, benefits, potential side effects reviewed with patient 3. Recommend supportive treatment with voice rest.  Recommend coolmist vaporizer.  Patient states she does not have one.  As an alternative she could try to run the shower in her bathroom next to her bedroom for moisture or as she prefers heating water over the stove.  I told her that this is a viral illness does not require antibiotics.  Does not require prednisone at this point.  If she continues to have problems or is concerned she should follow-up with an ear nose and throat physician. 4. F/u prn if symptoms worsen or don't improve   Final Clinical Impressions(s) / UC Diagnoses   Final diagnoses:  Acute laryngitis    ED Discharge Orders    None       Controlled Substance Prescriptions [place] Controlled Substance Registry consulted? Not Applicable   Lorin Picket, PA-C 02/26/17 1434

## 2017-02-26 NOTE — Discharge Instructions (Signed)
Moisturize your bedroom by running the shower hot until its Misty.  Drink plenty of fluids.  Try not to talk.  If laryngitis persists or worsens recommend following up with an ENT

## 2017-02-26 NOTE — ED Triage Notes (Signed)
Pt reports her voice suddenly got hoarse yesterday. Denies recent illness. Denies pain.

## 2017-03-05 ENCOUNTER — Other Ambulatory Visit: Payer: Self-pay | Admitting: Internal Medicine

## 2017-03-10 ENCOUNTER — Ambulatory Visit: Payer: PPO

## 2017-03-26 ENCOUNTER — Other Ambulatory Visit: Payer: Self-pay | Admitting: Internal Medicine

## 2017-04-13 ENCOUNTER — Other Ambulatory Visit: Payer: Self-pay

## 2017-04-13 ENCOUNTER — Ambulatory Visit
Admission: EM | Admit: 2017-04-13 | Discharge: 2017-04-13 | Disposition: A | Discharge status: Home / Self Care | Payer: PPO | Attending: Family Medicine | Admitting: Family Medicine

## 2017-04-13 DIAGNOSIS — H6123 Impacted cerumen, bilateral: Secondary | ICD-10-CM

## 2017-04-13 NOTE — ED Provider Notes (Signed)
[facility] ____________________________________________  Time seen: Approximately 1235 PM  I have reviewed the triage vital signs and the nursing notes.   HISTORY  Chief Complaint Cerumen Impaction   HPI Diana Lester is a 84 y.o. female presenting for evaluation of bilateral ear congested sensation and muffled hearing that is been present gradually over the last few days.  Patient reports history of similar in the past with cerumen impaction and states feels similar.  Denies pain.  Denies recent cough, congestion, fevers or other complaints.  Reports otherwise feels well. Denies chest pain, shortness of breath, abdominal pain. Denies recent sickness. Denies recent antibiotic use.   Einar Pheasant, MD: PCP   Past Medical History:  Diagnosis Date  . Arthritis   . Breast cancer ([place]) 1995   LT LUMPECTOMY & s/p XRT  . Depression   . Hx of colonic polyp   . Hypercholesterolemia   . Hypertension   . Melanoma ([place])   . Spinal stenosis    lumbar    Patient Active Problem List   Diagnosis Date Noted  . Lumbar spinal stenosis 01/23/2016  . Hypertension 01/23/2016  . History of depression 01/23/2016  . 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 Right lower quadrant abdominal pain   . Anxiety 10/20/2015  . Loss of weight 10/20/2015  . Burn 07/14/2015  . Hyponatremia 08/07/2014  . Health care maintenance 05/05/2014  . Skin cancer 01/29/2014  . Other malaise and fatigue 10/21/2013  . Skin lesion of cheek 10/21/2013  . Renal cyst 03/22/2013  . Chronic kidney disease, stage III (moderate) ([place]) 03/14/2013  . Abdominal bruit 03/14/2013  . Spinal stenosis of lumbar region 12/09/2012  . Bulging disc 12/09/2012  . History of colonic polyps 12/09/2012  . History of breast cancer 12/09/2012  . Depression 12/09/2012  . Essential hypertension, benign 12/09/2012  . Hyperlipidemia 12/09/2012    Past Surgical History:  Procedure Laterality Date  . BREAST BIOPSY Left  2006   EXCISIONAL - NEG  . BREAST BIOPSY Left 1995   LT LUMPECTOMY FOR CA  . BREAST LUMPECTOMY Left 1995  . EYE SURGERY Bilateral    cataract excision  . LAPAROSCOPIC APPENDECTOMY N/A 01/11/2016   Procedure: APPENDECTOMY LAPAROSCOPIC;  Surgeon: Dia Crawford III, MD;  Location: [facility] ORS;  Service: General;  Laterality: N/A;     No current facility-administered medications for this encounter.   Current Outpatient Medications:  .  aspirin 81 MG tablet, Take 81 mg by mouth daily., Disp: , Rfl:  .  Calcium Carb-Cholecalciferol (CALCIUM-VITAMIN D) 500-200 MG-UNIT tablet, Take 2 tablets by mouth. , Disp: , Rfl:  .  clonazePAM (KLONOPIN) 0.5 MG tablet, TAKE 1 TABLET BY MOUTH 1 TO 2 TIMES DAILY AS NEEDED, Disp: 135 tablet, Rfl: 0 .  lisinopril-hydrochlorothiazide (PRINZIDE,ZESTORETIC) 20-12.5 MG tablet, TAKE 1 TABLET BY MOUTH EVERY DAY, Disp: 90 tablet, Rfl: 0 .  lovastatin (MEVACOR) 40 MG tablet, TAKE 1-2 TABLETS BY MOUTH DAILY, Disp: 180 tablet, Rfl: 0 .  mirtazapine (REMERON) 30 MG tablet, TAKE 1 TABLET BY MOUTH AT BEDTIME, Disp: 90 tablet, Rfl: 0 .  Multiple Vitamin (MULTIVITAMIN) LIQD, Take 5 mLs by mouth daily., Disp: , Rfl:  .  pregabalin (LYRICA) 50 MG capsule, Take 50 mg by mouth 2 (two) times daily. , Disp: , Rfl:  .  traMADol (ULTRAM) 50 MG tablet, Take 1 tablet (50 mg total) by mouth every 8 (eight) hours as needed., Disp: 30 tablet, Rfl: 0  Allergies Contrast media [iodinated diagnostic agents]  Family  History  Problem Relation Age of Onset  . Arthritis Mother   . Hyperlipidemia Mother   . Hypertension Mother   . Hyperlipidemia Sister   . Kidney cancer Sister   . Breast cancer Neg Hx     Social History Social History   Tobacco Use  . Smoking status: Former Smoker    Packs/day: 0.50    Years: 4.00    Pack years: 2.00    Types: Cigarettes    Last attempt to quit: 12/09/1984    Years since quitting: 32.3  . Smokeless tobacco: Never Used  . Tobacco comment: quit 12/09/84    Substance Use Topics  . Alcohol use: No    Alcohol/week: 0.0 oz    Comment: sparingly  . Drug use: No    Review of Systems Constitutional: No fever/chills ENT: No sore throat. AS above.  Cardiovascular: Denies chest pain. Respiratory: Denies shortness of breath. Musculoskeletal: Negative for back pain. Skin: Negative for rash.   ____________________________________________   PHYSICAL EXAM:  VITAL SIGNS: ED Triage Vitals  Enc Vitals Group     BP 04/13/17 1219 (!) 125/50     Pulse Rate 04/13/17 1219 79     Resp 04/13/17 1219 16     Temp 04/13/17 1219 98.9 F (37.2 C)     Temp Source 04/13/17 1219 Oral     SpO2 04/13/17 1219 99 %     Weight 04/13/17 1218 136 lb (61.7 kg)     Height 04/13/17 1218 5\' 3"  (1.6 m)     Head Circumference --      Peak Flow --      Pain Score 04/13/17 1300 0     Pain Loc --      Pain Edu? --      Excl. in [place]? --    Constitutional: Alert and oriented. Well appearing and in no acute distress. Eyes: Conjunctivae are normal.  Head: Atraumatic. No sinus tenderness to palpation. No swelling. No erythema.  Ears: Bilateral ears total cerumen impaction present.  Post cerumen irrigation and curette removal, bilateral ears with normal canal, nontender, no erythema, normal TMs bilaterally.   Nose:No nasal congestion  Mouth/Throat: Mucous membranes are moist. No pharyngeal erythema. No tonsillar swelling or exudate.  Hematological/Lymphatic/Immunilogical: No cervical lymphadenopathy. Cardiovascular: Normal rate, regular rhythm. Grossly normal heart sounds.  Good peripheral circulation. Respiratory: Normal respiratory effort.  No retractions. No wheezes, rales or rhonchi. Good air movement.  Musculoskeletal: Ambulatory with steady gait. Neurologic:  Normal speech and language. No gait instability. Skin:  Skin appears warm, dry and intact. No rash noted. Psychiatric: Mood and affect are normal. Speech and behavior are  normal.  ___________________________________________   LABS (all labs ordered are listed, but only abnormal results are displayed)  Labs Reviewed - No data to display   PROCEDURES Procedures   Cerumen impaction noted bilaterally.  Wax is removed by Irrigation and curette by RN. Instructions for home care to prevent wax buildup are given.   INITIAL IMPRESSION / ASSESSMENT AND PLAN / ED COURSE  Pertinent labs & imaging results that were available during my care of the patient were reviewed by me and considered in my medical decision making (see chart for details).  Living patient.  No acute distress.  Bilateral cerumen impaction, removed in urgent care.  Patient tolerated well.  Encourage supportive care.   Discussed follow up and return parameters including no resolution or any worsening concerns. Patient verbalized understanding and agreed to plan.   ____________________________________________  FINAL CLINICAL IMPRESSION(S) / ED DIAGNOSES  Final diagnoses:  Bilateral impacted cerumen     ED Discharge Orders    None       Note: This dictation was prepared with Dragon dictation along with smaller phrase technology. Any transcriptional errors that result from this process are unintentional.         Marylene Land, NP 04/13/17 1430

## 2017-04-13 NOTE — ED Triage Notes (Signed)
Pt reports "I have wax in my ears" x past few days. Feels like "my head is in a drum."

## 2017-04-13 NOTE — ED Notes (Signed)
Cerumen disimpaction both ears

## 2017-04-14 ENCOUNTER — Ambulatory Visit: Payer: PPO | Admitting: Internal Medicine

## 2017-04-28 ENCOUNTER — Other Ambulatory Visit (HOSPITAL_COMMUNITY): Payer: Self-pay | Admitting: Neurology

## 2017-04-28 DIAGNOSIS — G3184 Mild cognitive impairment, so stated: Secondary | ICD-10-CM | POA: Diagnosis not present

## 2017-04-28 DIAGNOSIS — R4189 Other symptoms and signs involving cognitive functions and awareness: Secondary | ICD-10-CM | POA: Diagnosis not present

## 2017-04-28 DIAGNOSIS — E538 Deficiency of other specified B group vitamins: Secondary | ICD-10-CM | POA: Diagnosis not present

## 2017-04-28 DIAGNOSIS — E559 Vitamin D deficiency, unspecified: Secondary | ICD-10-CM | POA: Diagnosis not present

## 2017-05-02 ENCOUNTER — Telehealth: Payer: Self-pay | Admitting: *Deleted

## 2017-05-02 NOTE — Telephone Encounter (Signed)
Copied from [place] [phone]. Topic: Appointment Scheduling - Scheduling Inquiry for Clinic >> May 02, 2017  4:18 PM Aurelio Brash B wrote: Reason for CRM: Pt called to schedule an apt with Dr Nicki Reaper for a follow up concerning her memory issues.  She states she recently saw Dr Manuella Ghazi and he told her to make an apt with Dr Nicki Reaper ASAP. She wants to talk about alternative medication as she feels the meds may be contributing to her memory issues.  I did not see an apt slot available anytime soon and with Manuella Ghazi saying asap,  I didn't know if I should schedule her out that far.  Can she be worked in sooner?  Can the office call her with an apt day and time please.

## 2017-05-03 ENCOUNTER — Telehealth: Payer: Self-pay

## 2017-05-03 NOTE — Telephone Encounter (Signed)
Another phone note has already been sent to provider.

## 2017-05-03 NOTE — Telephone Encounter (Signed)
Copied from [place]. Topic: Appointment Scheduling - Scheduling Inquiry for Clinic >> May 03, 2017  8:11 AM Burnis Medin, NT wrote: Reason for CRM: Patient called and scheduled a cpe for 5/20 which was the next available appointment.  Pt. wanted to see if there is any way if she can be seen sooner for cpe. Patient said Dr. Manuella Ghazi from [facility] recommended her have a cpe as soon as possible for memory loss issues. If can be worked in patient would like a call back for scheduling.

## 2017-05-03 NOTE — Telephone Encounter (Signed)
Pt called back. Pt would like an appt as soon as possible to just address the memory issues. Please advise  Will keep the may appt

## 2017-05-03 NOTE — Telephone Encounter (Signed)
Reviewed neurology's note.  Recommended possibly adjusting medication.  Tell her we will find a place to work her in.  Please then send to [facility] to schedule appt.

## 2017-05-04 ENCOUNTER — Telehealth: Payer: Self-pay | Admitting: Internal Medicine

## 2017-05-04 NOTE — Telephone Encounter (Signed)
Diana Lester, looks like you were taking care of this for her and this is a duplicate message. Let me know if I need to do anything.

## 2017-05-04 NOTE — Telephone Encounter (Signed)
Copied from [place]. Topic: Appointment Scheduling - Scheduling Inquiry for Clinic >> May 04, 2017 10:54 AM Cecelia Byars, NT wrote: Reason for CRM: Patient called and need an appointment for memory issues with Dr Nicki Reaper  there is nothing available until May she has been told it may be 2 of her medications clonazepam and also tramadol can she possibly put on  her schedule before May please call her at [phone] she would like to know if Dr Nicki Reaper can review her med list if no appointment can be made and call her to discuss any suggestions .

## 2017-05-04 NOTE — Telephone Encounter (Signed)
Please advise 

## 2017-05-05 NOTE — Telephone Encounter (Signed)
Patient notified and Dr.Scott ask for you to find sooner appt.

## 2017-05-06 ENCOUNTER — Ambulatory Visit (HOSPITAL_COMMUNITY)
Admission: RE | Admit: 2017-05-06 | Discharge: 2017-05-06 | Disposition: A | Discharge status: Home / Self Care | Payer: PPO | Source: Ambulatory Visit | Attending: Neurology | Admitting: Neurology

## 2017-05-06 DIAGNOSIS — R4189 Other symptoms and signs involving cognitive functions and awareness: Secondary | ICD-10-CM | POA: Insufficient documentation

## 2017-05-06 DIAGNOSIS — R9389 Abnormal findings on diagnostic imaging of other specified body structures: Secondary | ICD-10-CM | POA: Diagnosis not present

## 2017-05-06 DIAGNOSIS — G3184 Mild cognitive impairment, so stated: Secondary | ICD-10-CM | POA: Diagnosis not present

## 2017-05-06 LAB — CREATININE, SERUM
CREATININE: 1.16 mg/dL — AB (ref 0.44–1.00)
GFR calc non Af Amer: 42 mL/min — ABNORMAL LOW (ref >60)
GFR, EST AFRICAN AMERICAN: 49 mL/min — AB (ref >60)

## 2017-05-06 MED ORDER — GADOBENATE DIMEGLUMINE 529 MG/ML IV SOLN
12.0000 mL | Freq: Once | INTRAVENOUS | Status: AC
  Administered 2017-05-06: 12 mL via INTRAVENOUS

## 2017-05-16 ENCOUNTER — Telehealth: Payer: Self-pay | Admitting: Internal Medicine

## 2017-05-16 ENCOUNTER — Ambulatory Visit (INDEPENDENT_AMBULATORY_CARE_PROVIDER_SITE_OTHER): Payer: PPO | Admitting: Internal Medicine

## 2017-05-16 DIAGNOSIS — N183 Chronic kidney disease, stage 3 unspecified: Secondary | ICD-10-CM

## 2017-05-16 DIAGNOSIS — F419 Anxiety disorder, unspecified: Secondary | ICD-10-CM

## 2017-05-16 DIAGNOSIS — M549 Dorsalgia, unspecified: Secondary | ICD-10-CM

## 2017-05-16 DIAGNOSIS — I10 Essential (primary) hypertension: Secondary | ICD-10-CM | POA: Diagnosis not present

## 2017-05-16 DIAGNOSIS — E785 Hyperlipidemia, unspecified: Secondary | ICD-10-CM | POA: Diagnosis not present

## 2017-05-16 NOTE — Telephone Encounter (Signed)
Copied from [place] [id number]. Topic: Quick Communication - See Telephone Encounter >> May 16, 2017  3:40 PM Margot Ables wrote: CRM for notification. See Telephone encounter for: 05/16/17.  Pt states the main reason for coming today was to address memory problems and she does not recall if it was discussed. Pt requesting a call back to notify.

## 2017-05-16 NOTE — Patient Instructions (Signed)
Decrease tramadol to one per day.  Take in the am.    Start tylenol extra strength - 2 tablets in the pm.     Continue clonazepam one in the am for now.

## 2017-05-16 NOTE — Progress Notes (Signed)
Patient ID: LULA KOLTON, female   DOB: Jan 02, 1931, 84 y.o.   MRN: [id number]   Subjective:    Patient ID: Youlanda Mighty, female    DOB: 1930-11-09, 84 y.o.   MRN: [id number]  HPI  Patient here for scheduled f/u.  She is accompanied by her sister.  History obtained from both of them.  Recently evaluated by neurology for cognitive impairment.  Note reviewed.  Her to discuss.  She is currently taking tramadol for back pain.  Prescribed by Dr Sharlet Salina.  Also on clonazepam.  Has problems with increased anxiety.  Needs something to help keep her calm.  She reports her back is doing relatively well now.  Takes tramadol bid.  Has not tried tylenol.  Discussed tapering.  Also discussed replacing clonazepam with SSRI - once off tramadol.  She is in agreement.  No chest pain.  Breathing stable.  No abdominal pain.  Bowels moving.  No urine change.      Past Medical History:  Diagnosis Date  . Arthritis   . Breast cancer ([place]) 1995   LT LUMPECTOMY & s/p XRT  . Depression   . Hx of colonic polyp   . Hypercholesterolemia   . Hypertension   . Melanoma ([place])   . Spinal stenosis    lumbar   Past Surgical History:  Procedure Laterality Date  . BREAST BIOPSY Left 2006   EXCISIONAL - NEG  . BREAST BIOPSY Left 1995   LT LUMPECTOMY FOR CA  . BREAST LUMPECTOMY Left 1995  . EYE SURGERY Bilateral    cataract excision  . LAPAROSCOPIC APPENDECTOMY N/A 01/11/2016   Procedure: APPENDECTOMY LAPAROSCOPIC;  Surgeon: Dia Crawford III, MD;  Location: [facility] ORS;  Service: General;  Laterality: N/A;   Family History  Problem Relation Age of Onset  . Arthritis Mother   . Hyperlipidemia Mother   . Hypertension Mother   . Hyperlipidemia Sister   . Kidney cancer Sister   . Breast cancer Neg Hx    Social History   Socioeconomic History  . Marital status: Widowed    Spouse name: None  . Number of children: 2  . Years of education: None  . Highest education level: None  Social Needs  . Financial resource  strain: None  . Food insecurity - worry: None  . Food insecurity - inability: None  . Transportation needs - medical: None  . Transportation needs - non-medical: None  Occupational History  . None  Tobacco Use  . Smoking status: Former Smoker    Packs/day: 0.50    Years: 4.00    Pack years: 2.00    Types: Cigarettes    Last attempt to quit: 12/09/1984    Years since quitting: 32.4  . Smokeless tobacco: Never Used  . Tobacco comment: quit 12/09/84  Substance and Sexual Activity  . Alcohol use: No    Alcohol/week: 0.0 oz    Comment: sparingly  . Drug use: No  . Sexual activity: No  Other Topics Concern  . None  Social History Narrative  . None    Outpatient Encounter Medications as of 05/16/2017  Medication Sig  . aspirin 81 MG tablet Take 81 mg by mouth daily.  . Calcium Carb-Cholecalciferol (CALCIUM-VITAMIN D) 500-200 MG-UNIT tablet Take 2 tablets by mouth.   . clonazePAM (KLONOPIN) 0.5 MG tablet TAKE 1 TABLET BY MOUTH 1 TO 2 TIMES DAILY AS NEEDED  . lisinopril-hydrochlorothiazide (PRINZIDE,ZESTORETIC) 20-12.5 MG tablet TAKE 1 TABLET BY MOUTH EVERY DAY  . lovastatin (  MEVACOR) 40 MG tablet TAKE 1-2 TABLETS BY MOUTH DAILY  . mirtazapine (REMERON) 30 MG tablet TAKE 1 TABLET BY MOUTH AT BEDTIME  . Multiple Vitamin (MULTIVITAMIN) LIQD Take 5 mLs by mouth daily.  . pregabalin (LYRICA) 50 MG capsule Take 50 mg by mouth 2 (two) times daily.   . traMADol (ULTRAM) 50 MG tablet Take 1 tablet (50 mg total) by mouth every 8 (eight) hours as needed.   No facility-administered encounter medications on file as of 05/16/2017.     Review of Systems  Constitutional: Negative for appetite change and unexpected weight change.  HENT: Negative for congestion and sinus pressure.   Respiratory: Negative for cough, chest tightness and shortness of breath.   Cardiovascular: Negative for chest pain, palpitations and leg swelling.  Gastrointestinal: Negative for abdominal pain, diarrhea, nausea and  vomiting.  Genitourinary: Negative for difficulty urinating and dysuria.  Musculoskeletal: Negative for joint swelling and myalgias.       Back is doing better.    Skin: Negative for color change and rash.  Neurological: Negative for dizziness, light-headedness and headaches.  Psychiatric/Behavioral: Negative for dysphoric mood.       Problems with anxiety as outlined.         Objective:    Physical Exam  Constitutional: She appears well-developed and well-nourished. No distress.  HENT:  Nose: Nose normal.  Mouth/Throat: Oropharynx is clear and moist.  Neck: Neck supple. No thyromegaly present.  Cardiovascular: Normal rate and regular rhythm.  Pulmonary/Chest: Breath sounds normal. No respiratory distress. She has no wheezes.  Abdominal: Soft. Bowel sounds are normal. There is no tenderness.  Musculoskeletal: She exhibits no edema or tenderness.  Lymphadenopathy:    She has no cervical adenopathy.  Skin: No rash noted. No erythema.  Psychiatric: She has a normal mood and affect. Her behavior is normal.    BP 140/80 (BP Location: Left Arm, Patient Position: Sitting, Cuff Size: Large)   Pulse 81   Temp 98.6 F (37 C) (Oral)   Resp 18   Wt 134 lb 3.2 oz (60.9 kg)   SpO2 94%   BMI 23.77 kg/m  Wt Readings from Last 3 Encounters:  05/16/17 134 lb 3.2 oz (60.9 kg)  04/13/17 136 lb (61.7 kg)  02/26/17 136 lb (61.7 kg)     Lab Results  Component Value Date   WBC 11.1 (H) 04/13/2016   HGB 14.0 04/13/2016   HCT 43.4 04/13/2016   PLT 268.0 04/13/2016   GLUCOSE 127 (H) 04/13/2016   CHOL 226 (H) 04/13/2016   TRIG 173.0 (H) 04/13/2016   HDL 66.00 04/13/2016   LDLDIRECT 126.7 04/17/2013   LDLCALC 125 (H) 04/13/2016   ALT 12 04/13/2016   AST 19 04/13/2016   NA 138 04/13/2016   K 4.8 04/13/2016   CL 102 04/13/2016   CREATININE 1.16 (H) 05/06/2017   BUN 25 (H) 04/13/2016   CO2 31 04/13/2016   TSH 2.85 12/15/2015    Mr Brain W Wo Contrast  Result Date:  05/06/2017 CLINICAL DATA:  Cognitive impairment. EXAM: MRI HEAD WITHOUT AND WITH CONTRAST TECHNIQUE: Multiplanar, multiecho pulse sequences of the brain and surrounding structures were obtained without and with intravenous contrast. Additionally, using NeuroQuant software a 3D volumetric analysis of the brain was performed and is compared to a normative database adjusted for age, gender and intracranial volume. COMPARISON:  None. FINDINGS: NeuroQuant Findings: Volumetric analysis of the brain was performed, with a fully detailed report in [place] PACS. Briefly, the comparison with age  and gender matched reference : Whole Brain:  88% Lateral ventricles:  50% Thalami:  Greater than 99% INTRACRANIAL CONTENTS: No reduced diffusion to suggest acute ischemia or hypercellular tumor. No susceptibility artifact to suggest hemorrhage. The ventricles and sulci are normal for patient's age. No suspicious parenchymal signal, masses, mass effect. Patchy supratentorial pontine white matter FLAIR T2 hyperintensities. Tiny T2 hyperintensities within basal ganglia and thalami compatible associated with small vessel ischemic disease. No abnormal intraparenchymal or extra-axial enhancement. No abnormal extra-axial fluid collections. No extra-axial masses. LEFT posterior fossa arachnoid cyst. VASCULAR: Normal major intracranial vascular flow voids present at skull base. SKULL AND UPPER CERVICAL SPINE: No abnormal sellar expansion. No suspicious calvarial bone marrow signal. Craniocervical junction maintained. SINUSES/ORBITS: Small LEFT mastoid effusion. Paranasal sinuses are well aerated.The included ocular globes and orbital contents are non-suspicious. Status post bilateral ocular lens implants. OTHER: None. IMPRESSION: 1. No acute intracranial process. 2. Moderate chronic small vessel ischemic disease. 1. NeuroQuant volumetric analysis of the brain, see details on [employer]. Electronically Signed   By: Elon Alas M.D.   On:  05/06/2017 20:57       Assessment & Plan:   Problem List Items Addressed This Visit    Anxiety    Has problems with increased anxiety.  On clonazepam and has been for years.  Only taking one per day.  Will taper of tramadol.  Discussed then starting SSRI and working to taper off clonazepam.  Pt in agreement.        Chronic kidney disease, stage III (moderate) (HCC)    Was evaluated by nephrology previously.  Desires not to return.  Recheck metabolic panel.  Was evaluated 05/2016 - Dr Johnney Ou.  Recommended f/u in 18 months.        Essential hypertension, benign    Blood pressure as outlined.  Hold on making changes.  Follow.        Relevant Orders   Urinalysis, Routine w reflex microscopic   Hyperlipidemia    Low cholesterol diet and exercise.  Follow lipid panel and liver function tests.  On lovastatin.         Other Visit Diagnoses    Back pain, unspecified back location, unspecified back pain laterality, unspecified chronicity           Einar Pheasant, MD

## 2017-05-17 NOTE — Telephone Encounter (Signed)
Patient aware.

## 2017-05-18 ENCOUNTER — Encounter: Payer: Self-pay | Admitting: Internal Medicine

## 2017-05-18 NOTE — Assessment & Plan Note (Signed)
Low cholesterol diet and exercise.  Follow lipid panel and liver function tests.  On lovastatin.

## 2017-05-18 NOTE — Assessment & Plan Note (Signed)
Blood pressure as outlined.  Hold on making changes.  Follow.

## 2017-05-18 NOTE — Assessment & Plan Note (Signed)
Was evaluated by nephrology previously.  Desires not to return.  Recheck metabolic panel.  Was evaluated 05/2016 - Dr Johnney Ou.  Recommended f/u in 18 months.

## 2017-05-18 NOTE — Assessment & Plan Note (Signed)
Has problems with increased anxiety.  On clonazepam and has been for years.  Only taking one per day.  Will taper of tramadol.  Discussed then starting SSRI and working to taper off clonazepam.  Pt in agreement.

## 2017-05-23 ENCOUNTER — Other Ambulatory Visit (INDEPENDENT_AMBULATORY_CARE_PROVIDER_SITE_OTHER): Payer: PPO

## 2017-05-23 ENCOUNTER — Ambulatory Visit (INDEPENDENT_AMBULATORY_CARE_PROVIDER_SITE_OTHER): Payer: PPO

## 2017-05-23 ENCOUNTER — Other Ambulatory Visit: Payer: PPO

## 2017-05-23 DIAGNOSIS — Z23 Encounter for immunization: Secondary | ICD-10-CM | POA: Diagnosis not present

## 2017-05-23 DIAGNOSIS — D72829 Elevated white blood cell count, unspecified: Secondary | ICD-10-CM

## 2017-05-23 DIAGNOSIS — I10 Essential (primary) hypertension: Secondary | ICD-10-CM

## 2017-05-23 LAB — CBC WITH DIFFERENTIAL/PLATELET
BASOS ABS: 0.1 10*3/uL (ref 0.0–0.1)
Basophils Relative: 0.7 % (ref 0.0–3.0)
EOS ABS: 0.2 10*3/uL (ref 0.0–0.7)
Eosinophils Relative: 3.2 % (ref 0.0–5.0)
HEMATOCRIT: 46.5 % — AB (ref 36.0–46.0)
HEMOGLOBIN: 15.5 g/dL — AB (ref 12.0–15.0)
LYMPHS PCT: 22.7 % (ref 12.0–46.0)
Lymphs Abs: 1.7 10*3/uL (ref 0.7–4.0)
MCHC: 33.2 g/dL (ref 30.0–36.0)
MCV: 88.4 fl (ref 78.0–100.0)
MONO ABS: 0.5 10*3/uL (ref 0.1–1.0)
Monocytes Relative: 7.1 % (ref 3.0–12.0)
Neutro Abs: 5 10*3/uL (ref 1.4–7.7)
Neutrophils Relative %: 66.3 % (ref 43.0–77.0)
PLATELETS: 276 10*3/uL (ref 150.0–400.0)
RBC: 5.26 Mil/uL — ABNORMAL HIGH (ref 3.87–5.11)
RDW: 14.1 % (ref 11.5–15.5)
WBC: 7.6 10*3/uL (ref 4.0–10.5)

## 2017-05-23 LAB — URINALYSIS, ROUTINE W REFLEX MICROSCOPIC
Bilirubin Urine: NEGATIVE
Hgb urine dipstick: NEGATIVE
Ketones, ur: NEGATIVE
Leukocytes, UA: NEGATIVE
Nitrite: NEGATIVE
PH: 5.5 (ref 5.0–8.0)
RBC / HPF: NONE SEEN (ref 0–?)
SPECIFIC GRAVITY, URINE: 1.025 (ref 1.000–1.030)
Total Protein, Urine: NEGATIVE
UROBILINOGEN UA: 0.2 (ref 0.0–1.0)
Urine Glucose: NEGATIVE

## 2017-05-23 LAB — HEPATIC FUNCTION PANEL
ALK PHOS: 66 U/L (ref 39–117)
ALT: 12 U/L (ref 0–35)
AST: 19 U/L (ref 0–37)
Albumin: 4.5 g/dL (ref 3.5–5.2)
BILIRUBIN DIRECT: 0.2 mg/dL (ref 0.0–0.3)
TOTAL PROTEIN: 7.1 g/dL (ref 6.0–8.3)
Total Bilirubin: 0.7 mg/dL (ref 0.2–1.2)

## 2017-05-23 LAB — LIPID PANEL
CHOL/HDL RATIO: 3
Cholesterol: 224 mg/dL — ABNORMAL HIGH (ref 0–200)
HDL: 68 mg/dL (ref >39.00)
LDL CALC: 123 mg/dL — AB (ref 0–99)
NONHDL: 156.34
Triglycerides: 167 mg/dL — ABNORMAL HIGH (ref 0.0–149.0)
VLDL: 33.4 mg/dL (ref 0.0–40.0)

## 2017-05-23 LAB — TSH: TSH: 4.87 u[IU]/mL — ABNORMAL HIGH (ref 0.35–4.50)

## 2017-05-23 LAB — BASIC METABOLIC PANEL
BUN: 21 mg/dL (ref 6–23)
CHLORIDE: 103 meq/L (ref 96–112)
CO2: 27 mEq/L (ref 19–32)
Calcium: 10.2 mg/dL (ref 8.4–10.5)
Creatinine, Ser: 1.39 mg/dL — ABNORMAL HIGH (ref 0.40–1.20)
GFR: 38.36 mL/min — AB (ref >60.00)
Glucose, Bld: 132 mg/dL — ABNORMAL HIGH (ref 70–99)
POTASSIUM: 4.2 meq/L (ref 3.5–5.1)
SODIUM: 141 meq/L (ref 135–145)

## 2017-05-23 NOTE — Progress Notes (Signed)
Patient came in for pneumonia shot.  She received injection in right deltoid.  Patient tolerated injection very well.  Patient had no questions, comments, and or concerns.  Reviewed.  Dr Nicki Reaper

## 2017-05-24 ENCOUNTER — Other Ambulatory Visit: Payer: Self-pay | Admitting: Internal Medicine

## 2017-05-24 ENCOUNTER — Telehealth: Payer: Self-pay | Admitting: Internal Medicine

## 2017-05-24 DIAGNOSIS — R7989 Other specified abnormal findings of blood chemistry: Secondary | ICD-10-CM

## 2017-05-24 DIAGNOSIS — D582 Other hemoglobinopathies: Secondary | ICD-10-CM

## 2017-05-24 MED ORDER — LISINOPRIL-HYDROCHLOROTHIAZIDE 20-12.5 MG PO TABS: 1.0000 | ORAL_TABLET | Freq: Every day | ORAL | 0 refills | Status: DC

## 2017-05-24 NOTE — Telephone Encounter (Signed)
Copied from [place]. Topic: Quick Communication - Rx Refill/Question >> May 24, 2017  3:14 PM Ahmed Prima L wrote: Medication: lisinopril-hydrochlorothiazide (PRINZIDE,ZESTORETIC) 20-12.5 MG tablet   Has the patient contacted their pharmacy? Yes last week   (Agent: If no, request that the patient contact the pharmacy for the refill.)   Preferred Pharmacy (with phone number or street name): [facility]   Agent: Please be advised that RX refills may take up to 3 business days. We ask that you follow-up with your pharmacy.

## 2017-05-24 NOTE — Progress Notes (Signed)
Orders placed for f/u labs.  

## 2017-05-31 DIAGNOSIS — M47816 Spondylosis without myelopathy or radiculopathy, lumbar region: Secondary | ICD-10-CM | POA: Diagnosis not present

## 2017-06-11 ENCOUNTER — Other Ambulatory Visit: Payer: Self-pay | Admitting: Family

## 2017-06-13 ENCOUNTER — Ambulatory Visit: Payer: PPO | Admitting: Internal Medicine

## 2017-06-14 ENCOUNTER — Encounter: Payer: Self-pay | Admitting: Internal Medicine

## 2017-06-14 ENCOUNTER — Ambulatory Visit (INDEPENDENT_AMBULATORY_CARE_PROVIDER_SITE_OTHER): Payer: PPO | Admitting: Internal Medicine

## 2017-06-14 ENCOUNTER — Ambulatory Visit: Payer: PPO | Admitting: Internal Medicine

## 2017-06-14 VITALS — BP 146/80 | HR 91 | Temp 98.1°F | Resp 18 | Wt 134.0 lb

## 2017-06-14 DIAGNOSIS — F32A Depression, unspecified: Secondary | ICD-10-CM

## 2017-06-14 DIAGNOSIS — R7989 Other specified abnormal findings of blood chemistry: Secondary | ICD-10-CM

## 2017-06-14 DIAGNOSIS — E785 Hyperlipidemia, unspecified: Secondary | ICD-10-CM | POA: Diagnosis not present

## 2017-06-14 DIAGNOSIS — M48061 Spinal stenosis, lumbar region without neurogenic claudication: Secondary | ICD-10-CM

## 2017-06-14 DIAGNOSIS — F419 Anxiety disorder, unspecified: Secondary | ICD-10-CM | POA: Diagnosis not present

## 2017-06-14 DIAGNOSIS — D582 Other hemoglobinopathies: Secondary | ICD-10-CM | POA: Diagnosis not present

## 2017-06-14 DIAGNOSIS — R350 Frequency of micturition: Secondary | ICD-10-CM | POA: Diagnosis not present

## 2017-06-14 DIAGNOSIS — N183 Chronic kidney disease, stage 3 unspecified: Secondary | ICD-10-CM

## 2017-06-14 DIAGNOSIS — F329 Major depressive disorder, single episode, unspecified: Secondary | ICD-10-CM

## 2017-06-14 DIAGNOSIS — I10 Essential (primary) hypertension: Secondary | ICD-10-CM

## 2017-06-14 LAB — POCT URINALYSIS DIP (MANUAL ENTRY)
BILIRUBIN UA: NEGATIVE
BILIRUBIN UA: NEGATIVE mg/dL
Glucose, UA: NEGATIVE mg/dL
LEUKOCYTES UA: NEGATIVE
Nitrite, UA: NEGATIVE
PROTEIN UA: NEGATIVE mg/dL
Spec Grav, UA: <=1.005 — AB (ref 1.010–1.025)
Urobilinogen, UA: 0.2 E.U./dL
pH, UA: 6.5 (ref 5.0–8.0)

## 2017-06-14 LAB — HEMOGLOBIN: Hemoglobin: 15.5 g/dL — ABNORMAL HIGH (ref 12.0–15.0)

## 2017-06-14 LAB — TSH: TSH: 2.89 u[IU]/mL (ref 0.35–4.50)

## 2017-06-14 NOTE — Telephone Encounter (Signed)
Last filled 03/07/17 Next office visit 07/27/17

## 2017-06-14 NOTE — Patient Instructions (Signed)
Stop tramadol

## 2017-06-14 NOTE — Progress Notes (Signed)
[name], female   DOB: 06-17-1930, 84 y.o.   MRN: [id number]   Subjective:    [name], female    DOB: [birth year], 84 y.o.   MRN: [id number]  HPI  Patient here for a scheduled follow up.  She saw Dr Sharlet Salina for her back.  States pain is better.  Trying to gradually get her off of tramadol and clonazepam.  She is only taking one tramadol per day now.  Takes this in the morning.  Takes tylenol in the evening.  Since the procedure by Dr Sharlet Salina, pain is better.  Discussed stopping the tramadol.  She is only taking one clonazepam in the am.  Stays active.  No chest pain.  No sob.  Eating.  No nausea or vomiting.  No abdominal pain.  Has trouble with her memory.  States she makes a lot of notes.  She does report some urinary frequency.     Past Medical History:  Diagnosis Date  . Arthritis   . Breast cancer ([place]) 1995   LT LUMPECTOMY & s/p XRT  . Depression   . Hx of colonic polyp   . Hypercholesterolemia   . Hypertension   . Melanoma ([place])   . Spinal stenosis    lumbar   Past Surgical History:  Procedure Laterality Date  . BREAST BIOPSY Left 2006   EXCISIONAL - NEG  . BREAST BIOPSY Left 1995   LT LUMPECTOMY FOR CA  . BREAST LUMPECTOMY Left 1995  . EYE SURGERY Bilateral    cataract excision  . LAPAROSCOPIC APPENDECTOMY N/A 01/11/2016   Procedure: APPENDECTOMY LAPAROSCOPIC;  Surgeon: Dia Crawford III, MD;  Location: [facility] ORS;  Service: General;  Laterality: N/A;   Family History  Problem Relation Age of Onset  . Arthritis Mother   . Hyperlipidemia Mother   . Hypertension Mother   . Hyperlipidemia Sister   . Kidney cancer Sister   . Breast cancer Neg Hx    Social History   Socioeconomic History  . Marital status: Widowed    Spouse name: Not on file  . Number of children: 2  . Years of education: Not on file  . Highest education level: Not on file  Occupational History  . Not on file  Social Needs  . Financial resource strain: Not on  file  . Food insecurity:    Worry: Not on file    Inability: Not on file  . Transportation needs:    Medical: Not on file    Non-medical: Not on file  Tobacco Use  . Smoking status: Former Smoker    Packs/day: 0.50    Years: 4.00    Pack years: 2.00    Types: Cigarettes    Last attempt to quit: 12/09/1984    Years since quitting: 32.5  . Smokeless tobacco: Never Used  . Tobacco comment: quit 12/09/84  Substance and Sexual Activity  . Alcohol use: No    Alcohol/week: 0.0 oz    Comment: sparingly  . Drug use: No  . Sexual activity: Never  Lifestyle  . Physical activity:    Days per week: Not on file    Minutes per session: Not on file  . Stress: Not on file  Relationships  . Social connections:    Talks on phone: Not on file    Gets together: Not on file    Attends religious service: Not on file    Active member of club or organization: Not on file  Attends meetings of clubs or organizations: Not on file    Relationship status: Not on file  Other Topics Concern  . Not on file  Social History Narrative  . Not on file    Outpatient Encounter Medications as of 06/14/2017  Medication Sig  . aspirin 81 MG tablet Take 81 mg by mouth daily.  . Calcium Carb-Cholecalciferol (CALCIUM-VITAMIN D) 500-200 MG-UNIT tablet Take 2 tablets by mouth.   Marland Kitchen lisinopril-hydrochlorothiazide (PRINZIDE,ZESTORETIC) 20-12.5 MG tablet Take 1 tablet by mouth daily.  Marland Kitchen lovastatin (MEVACOR) 40 MG tablet TAKE 1-2 TABLETS BY MOUTH DAILY  . Multiple Vitamin (MULTIVITAMIN) LIQD Take 5 mLs by mouth daily.  . pregabalin (LYRICA) 50 MG capsule Take 50 mg by mouth 2 (two) times daily.   . [DISCONTINUED] clonazePAM (KLONOPIN) 0.5 MG tablet TAKE 1 TABLET BY MOUTH 1 TO 2 TIMES DAILY AS NEEDED  . [DISCONTINUED] mirtazapine (REMERON) 30 MG tablet TAKE 1 TABLET BY MOUTH AT BEDTIME  . [DISCONTINUED] traMADol (ULTRAM) 50 MG tablet Take 1 tablet (50 mg total) by mouth every 8 (eight) hours as needed.   No  facility-administered encounter medications on file as of 06/14/2017.     Review of Systems  Constitutional: Negative for appetite change and unexpected weight change.  HENT: Negative for congestion and sinus pressure.   Respiratory: Negative for cough, chest tightness and shortness of breath.   Cardiovascular: Negative for chest pain, palpitations and leg swelling.  Gastrointestinal: Negative for abdominal pain, diarrhea, nausea and vomiting.  Genitourinary: Positive for frequency. Negative for difficulty urinating.  Musculoskeletal: Negative for myalgias and neck pain.  Skin: Negative for color change and rash.  Neurological: Negative for dizziness, light-headedness and headaches.  Psychiatric/Behavioral: Negative for agitation and dysphoric mood.       Objective:    Physical Exam  Constitutional: She appears well-developed and well-nourished. No distress.  HENT:  Nose: Nose normal.  Mouth/Throat: Oropharynx is clear and moist.  Neck: Neck supple. No thyromegaly present.  Cardiovascular: Normal rate and regular rhythm.  Pulmonary/Chest: Breath sounds normal. No respiratory distress. She has no wheezes.  Abdominal: Soft. Bowel sounds are normal. There is no tenderness.  Musculoskeletal: She exhibits no edema or tenderness.  Lymphadenopathy:    She has no cervical adenopathy.  Skin: No rash noted. No erythema.  Psychiatric: She has a normal mood and affect. Her behavior is normal.    BP (!) 146/80 (BP Location: Right Arm, Patient Position: Sitting, Cuff Size: Normal)   Pulse 91   Temp 98.1 F (36.7 C) (Oral)   Resp 18   Wt 134 lb (60.8 kg)   SpO2 97%   BMI 23.74 kg/m  Wt Readings from Last 3 Encounters:  06/14/17 134 lb (60.8 kg)  05/16/17 134 lb 3.2 oz (60.9 kg)  04/13/17 136 lb (61.7 kg)     Lab Results  Component Value Date   WBC 7.6 05/23/2017   HGB 15.5 (H) 06/14/2017   HCT 46.5 (H) 05/23/2017   PLT 276.0 05/23/2017   GLUCOSE 132 (H) 05/23/2017   CHOL 224  (H) 05/23/2017   TRIG 167.0 (H) 05/23/2017   HDL 68.00 05/23/2017   LDLDIRECT 126.7 04/17/2013   LDLCALC 123 (H) 05/23/2017   ALT 12 05/23/2017   AST 19 05/23/2017   NA 141 05/23/2017   K 4.2 05/23/2017   CL 103 05/23/2017   CREATININE 1.39 (H) 05/23/2017   BUN 21 05/23/2017   CO2 27 05/23/2017   TSH 2.89 06/14/2017    Mr Brain W  Wo Contrast  Result Date: 05/06/2017 CLINICAL DATA:  Cognitive impairment. EXAM: MRI HEAD WITHOUT AND WITH CONTRAST TECHNIQUE: Multiplanar, multiecho pulse sequences of the brain and surrounding structures were obtained without and with intravenous contrast. Additionally, using NeuroQuant software a 3D volumetric analysis of the brain was performed and is compared to a normative database adjusted for age, gender and intracranial volume. COMPARISON:  None. FINDINGS: NeuroQuant Findings: Volumetric analysis of the brain was performed, with a fully detailed report in [place] PACS. Briefly, the comparison with age and gender matched reference : Whole Brain:  88% Lateral ventricles:  50% Thalami:  Greater than 99% INTRACRANIAL CONTENTS: No reduced diffusion to suggest acute ischemia or hypercellular tumor. No susceptibility artifact to suggest hemorrhage. The ventricles and sulci are normal for patient's age. No suspicious parenchymal signal, masses, mass effect. Patchy supratentorial pontine white matter FLAIR T2 hyperintensities. Tiny T2 hyperintensities within basal ganglia and thalami compatible associated with small vessel ischemic disease. No abnormal intraparenchymal or extra-axial enhancement. No abnormal extra-axial fluid collections. No extra-axial masses. LEFT posterior fossa arachnoid cyst. VASCULAR: Normal major intracranial vascular flow voids present at skull base. SKULL AND UPPER CERVICAL SPINE: No abnormal sellar expansion. No suspicious calvarial bone marrow signal. Craniocervical junction maintained. SINUSES/ORBITS: Small LEFT mastoid effusion. Paranasal  sinuses are well aerated.The included ocular globes and orbital contents are non-suspicious. Status post bilateral ocular lens implants. OTHER: None. IMPRESSION: 1. No acute intracranial process. 2. Moderate chronic small vessel ischemic disease. 1. NeuroQuant volumetric analysis of the brain, see details on [employer]. Electronically Signed   By: Elon Alas M.D.   On: 05/06/2017 20:57       Assessment & Plan:   Problem List Items Addressed This Visit    Anxiety    Has had a history of increased anxiety.  On clonazepam.  Taking one per day now.  Will stop the tramadol.  If she does ok with this, then will try to decrease clonazepam use and may add SSRI.  Discussed with pt.        Chronic kidney disease, stage III (moderate) (HCC)    Has been followed by nephrology previously.  Desires not to return.  Follow metabolic panel.        Depression    On remeron. She feels things are stable.  Follow.       Essential hypertension, benign    Blood pressure as outlined.  Same medication regimen.  Follow pressures.  Follow metabolic panel.        Hyperlipidemia    Follow lipid panel and liver function tests.  On lovastatin.        Lumbar spinal stenosis    Saw Dr Sharlet Salina.  S/p procedure. Pain better.  Stop tramadol.  Use tylenol.        Other Visit Diagnoses    Urine frequency    -  Primary   check urinalysis to confirm no infection.    Relevant Orders   POCT urinalysis dipstick (Completed)   Urine Culture (Completed)   Urine Microscopic Only (Completed)   Elevated hemoglobin (HCC)       recheck cbc.    Elevated TSH           Einar Pheasant, MD

## 2017-06-15 ENCOUNTER — Other Ambulatory Visit: Payer: Self-pay | Admitting: Internal Medicine

## 2017-06-15 DIAGNOSIS — R350 Frequency of micturition: Secondary | ICD-10-CM | POA: Diagnosis not present

## 2017-06-15 LAB — URINALYSIS, MICROSCOPIC ONLY: RBC / HPF: NONE SEEN (ref 0–?)

## 2017-06-16 LAB — URINE CULTURE
MICRO NUMBER: 90442145
RESULT: NO GROWTH
SPECIMEN QUALITY:: ADEQUATE

## 2017-06-17 ENCOUNTER — Other Ambulatory Visit: Payer: Self-pay | Admitting: Internal Medicine

## 2017-06-17 ENCOUNTER — Telehealth: Payer: Self-pay | Admitting: Internal Medicine

## 2017-06-17 ENCOUNTER — Ambulatory Visit: Payer: Self-pay | Admitting: *Deleted

## 2017-06-17 NOTE — Telephone Encounter (Signed)
Last OV 06/14/2017 Next OV 07/27/2017 Last refill 12/29/2016

## 2017-06-17 NOTE — Telephone Encounter (Signed)
Pt given results per notes of Dr. Nicki Reaper on 06/16/17. Unable to document in result note due to result note not being routed to [facility].  Notes recorded by Einar Pheasant, MD on 06/16/2017 at 10:10 AM EDT Please notify pt that urine culture is negative.

## 2017-06-17 NOTE — Telephone Encounter (Signed)
rx ok'd for clonazepam one po q day prn #90 with no refills.  rx signed and placed in box.

## 2017-06-17 NOTE — Telephone Encounter (Signed)
Patients labs were negative. Diana Lester spoke with you about this pt.

## 2017-06-17 NOTE — Telephone Encounter (Signed)
Patient declined pain and stated she had exaggerated earlier. Advised patietn to  Go to urgent care if symptoms start again. Patient agreed to comply

## 2017-06-17 NOTE — Telephone Encounter (Signed)
Patient was in the office on Tuesday- all testing was negative- but patient is having symptoms. Call to office- information collected per request and sent to office for review by provider.  Answer Assessment - Initial Assessment Questions 1. SYMPTOM: "What's the main symptom you're concerned about?" (e.g., frequency, incontinence)     Patient is having frequency and has started burning with urination 2. ONSET: "When did the  ________  start?"     Last night the symptoms got worse- patient reports she did not sleep well 3. PAIN: "Is there any pain?" If so, ask: "How bad is it?" (Scale: 1-10; mild, moderate, severe)     Pain with urination- 7  4. CAUSE: "What do you think is causing the symptoms?"     Possible UTI 5. OTHER SYMPTOMS: "Do you have any other symptoms?" (e.g., fever, flank pain, blood in urine, pain with urination)     Pain with urination, frequency- going about every 1/2 hour 6. PREGNANCY: "Is there any chance you are pregnant?" "When was your last menstrual period?"     n/a  Protocols used: URINARY [facility]

## 2017-06-17 NOTE — Telephone Encounter (Signed)
Since her urine culture was negative and pian is a 7/10, I do feel she needs to be seen.  I could work her in next week, but I don't want her waiting that long to be seen.   Needs to go to acute care for evaluation.

## 2017-06-18 ENCOUNTER — Encounter: Payer: Self-pay | Admitting: Internal Medicine

## 2017-06-18 NOTE — Assessment & Plan Note (Signed)
Saw Dr Sharlet Salina.  S/p procedure. Pain better.  Stop tramadol.  Use tylenol.

## 2017-06-18 NOTE — Assessment & Plan Note (Signed)
Follow lipid panel and liver function tests.  On lovastatin.

## 2017-06-18 NOTE — Assessment & Plan Note (Signed)
Has been followed by nephrology previously.  Desires not to return.  Follow metabolic panel.

## 2017-06-18 NOTE — Assessment & Plan Note (Signed)
On remeron. She feels things are stable.  Follow.

## 2017-06-18 NOTE — Assessment & Plan Note (Signed)
Blood pressure as outlined.  Same medication regimen.  Follow pressures.  Follow metabolic panel.  

## 2017-06-18 NOTE — Assessment & Plan Note (Signed)
Has had a history of increased anxiety.  On clonazepam.  Taking one per day now.  Will stop the tramadol.  If she does ok with this, then will try to decrease clonazepam use and may add SSRI.  Discussed with pt.

## 2017-06-20 ENCOUNTER — Other Ambulatory Visit: Payer: Self-pay

## 2017-06-20 ENCOUNTER — Telehealth: Payer: Self-pay | Admitting: Internal Medicine

## 2017-06-20 ENCOUNTER — Ambulatory Visit
Admission: EM | Admit: 2017-06-20 | Discharge: 2017-06-20 | Disposition: A | Discharge status: Home / Self Care | Payer: PPO | Attending: Family Medicine | Admitting: Family Medicine

## 2017-06-20 DIAGNOSIS — R350 Frequency of micturition: Secondary | ICD-10-CM

## 2017-06-20 LAB — URINALYSIS, COMPLETE (UACMP) WITH MICROSCOPIC
BACTERIA UA: NONE SEEN
Bilirubin Urine: NEGATIVE
Glucose, UA: NEGATIVE mg/dL
Ketones, ur: NEGATIVE mg/dL
Leukocytes, UA: NEGATIVE
Nitrite: NEGATIVE
PH: 5.5 (ref 5.0–8.0)
PROTEIN: NEGATIVE mg/dL
Specific Gravity, Urine: 1.01 (ref 1.005–1.030)
WBC, UA: NONE SEEN WBC/hpf (ref 0–5)

## 2017-06-20 NOTE — Telephone Encounter (Signed)
This is the patient that called at the end of last week.  Agree that she needs to be seen if persistent symptoms.  I can see her Wednesday at 1:00, but I do not want her to wait that long.

## 2017-06-20 NOTE — Telephone Encounter (Signed)
Patient stated she was having some frequent urination and slight burning. Patient stated if symptoms worsened she would go over to the walk-in.

## 2017-06-20 NOTE — Telephone Encounter (Signed)
See my previous message

## 2017-06-20 NOTE — ED Provider Notes (Signed)
[facility]    CSN: [id number] Arrival date & time: 06/20/17  1500     History   Chief Complaint Chief Complaint  Patient presents with  . Urinary Frequency    HPI Diana Lester is a 84 y.o. female.   HPI  84 year old female presents with urinary frequency.  She has no urgency.  Denies any vaginal pain or discomfort or discharge.  Complains of a fullness in the pelvic area. No  Bleeding.  No fever or chills.  She was seen by Dr. Nicki Reaper her primary care physician on 06/14/2017. urinalysis did not show any significant infection and cultures confirmed this.          Past Medical History:  Diagnosis Date  . Arthritis   . Breast cancer ([place]) 1995   LT LUMPECTOMY & s/p XRT  . Depression   . Hx of colonic polyp   . Hypercholesterolemia   . Hypertension   . Melanoma ([place])   . Spinal stenosis    lumbar    Patient Active Problem List   Diagnosis Date Noted  . Lumbar spinal stenosis 01/23/2016  . Hypertension 01/23/2016  . History of depression 01/23/2016  . 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 Right lower quadrant abdominal pain   . Anxiety 10/20/2015  . Loss of weight 10/20/2015  . Hyponatremia 08/07/2014  . Health care maintenance 05/05/2014  . Skin cancer 01/29/2014  . Other malaise and fatigue 10/21/2013  . Skin lesion of cheek 10/21/2013  . Renal cyst 03/22/2013  . Chronic kidney disease, stage III (moderate) ([place]) 03/14/2013  . Abdominal bruit 03/14/2013  . Spinal stenosis of lumbar region 12/09/2012  . Bulging disc 12/09/2012  . History of colonic polyps 12/09/2012  . History of breast cancer 12/09/2012  . Depression 12/09/2012  . Essential hypertension, benign 12/09/2012  . Hyperlipidemia 12/09/2012    Past Surgical History:  Procedure Laterality Date  . BREAST BIOPSY Left 2006   EXCISIONAL - NEG  . BREAST BIOPSY Left 1995   LT LUMPECTOMY FOR CA  . BREAST LUMPECTOMY Left 1995  . EYE SURGERY Bilateral    cataract excision  .  LAPAROSCOPIC APPENDECTOMY N/A 01/11/2016   Procedure: APPENDECTOMY LAPAROSCOPIC;  Surgeon: Dia Crawford III, MD;  Location: [facility] ORS;  Service: General;  Laterality: N/A;    OB History   None      Home Medications    Prior to Admission medications   Medication Sig Start Date End Date Taking? Authorizing Provider  aspirin 81 MG tablet Take 81 mg by mouth daily.    [provider]  Calcium Carb-Cholecalciferol (CALCIUM-VITAMIN D) 500-200 MG-UNIT tablet Take 2 tablets by mouth.     [provider]  clonazePAM (KLONOPIN) 0.5 MG tablet Take 1 tablet (0.5 mg total) by mouth daily as needed for anxiety. 06/17/17   Einar Pheasant, MD  lisinopril-hydrochlorothiazide (PRINZIDE,ZESTORETIC) 20-12.5 MG tablet Take 1 tablet by mouth daily. 05/24/17   Einar Pheasant, MD  lovastatin (MEVACOR) 40 MG tablet TAKE 1-2 TABLETS BY MOUTH DAILY 03/28/17   Einar Pheasant, MD  mirtazapine (REMERON) 30 MG tablet TAKE 1 TABLET BY MOUTH AT BEDTIME 06/15/17   Einar Pheasant, MD  Multiple Vitamin (MULTIVITAMIN) LIQD Take 5 mLs by mouth daily.    [provider]  pregabalin (LYRICA) 50 MG capsule Take 50 mg by mouth 2 (two) times daily.     [provider]    Family History Family History  Problem Relation Age of Onset  . Arthritis Mother   .  Hyperlipidemia Mother   . Hypertension Mother   . Hyperlipidemia Sister   . Kidney cancer Sister   . Breast cancer Neg Hx     Social History Social History   Tobacco Use  . Smoking status: Former Smoker    Packs/day: 0.50    Years: 4.00    Pack years: 2.00    Types: Cigarettes    Last attempt to quit: 12/09/1984    Years since quitting: 32.5  . Smokeless tobacco: Never Used  . Tobacco comment: quit 12/09/84  Substance Use Topics  . Alcohol use: No    Alcohol/week: 0.0 oz    Comment: sparingly  . Drug use: No     Allergies   Contrast media [iodinated diagnostic agents]   Review of Systems Review of Systems    Constitutional: Positive for activity change. Negative for appetite change, chills, fatigue and fever.  Genitourinary: Positive for frequency. Negative for dysuria, hematuria, urgency, vaginal bleeding, vaginal discharge and vaginal pain.  All other systems reviewed and are negative.    Physical Exam Triage Vital Signs ED Triage Vitals  Enc Vitals Group     BP 06/20/17 1510 (!) 143/80     Pulse Rate 06/20/17 1510 86     Resp 06/20/17 1510 16     Temp 06/20/17 1510 98.7 F (37.1 C)     Temp Source 06/20/17 1510 Oral     SpO2 06/20/17 1510 97 %     Weight 06/20/17 1507 133 lb (60.3 kg)     Height 06/20/17 1507 5\' 3"  (1.6 m)     Head Circumference --      Peak Flow --      Pain Score 06/20/17 1507 0     Pain Loc --      Pain Edu? --      Excl. in [place]? --    No data found.  Updated Vital Signs BP (!) 143/80 (BP Location: Right Arm)   Pulse 86   Temp 98.7 F (37.1 C) (Oral)   Resp 16   Ht 5\' 3"  (1.6 m)   Wt 133 lb (60.3 kg)   SpO2 97%   BMI 23.56 kg/m   Visual Acuity Right Eye Distance:   Left Eye Distance:   Bilateral Distance:    Right Eye Near:   Left Eye Near:    Bilateral Near:     Physical Exam  Constitutional: She is oriented to person, place, and time. She appears well-developed and well-nourished. No distress.  HENT:  Head: Normocephalic.  Eyes: Pupils are equal, round, and reactive to light.  Neck: Normal range of motion.  Musculoskeletal: Normal range of motion.  Neurological: She is alert and oriented to person, place, and time.  Skin: Skin is warm and dry. She is not diaphoretic.  Psychiatric: She has a normal mood and affect. Her behavior is normal. Judgment and thought content normal.  Nursing note and vitals reviewed.    UC Treatments / Results  Labs (all labs ordered are listed, but only abnormal results are displayed) Labs Reviewed  URINALYSIS, COMPLETE (UACMP) WITH MICROSCOPIC - Abnormal; Notable for the following components:       Result Value   Color, Urine STRAW (*)    Hgb urine dipstick TRACE (*)    Squamous Epithelial / LPF 0-5 (*)    All other components within normal limits    EKG None Radiology No results found.  Procedures Procedures (including critical care time)  Medications Ordered in UC Medications -  No data to display   Initial Impression / Assessment and Plan / UC Course  I have reviewed the triage vital signs and the nursing notes.  Pertinent labs & imaging results that were available during my care of the patient were reviewed by me and considered in my medical decision making (see chart for details).     Plan: 1. Test/x-ray results and diagnosis reviewed with patient 2. rx as per orders; risks, benefits, potential side effects reviewed with patient 3. Recommend supportive treatment with contacting Dr. Nicki Reaper for further instructions.  Patient may benefit from referral to a urologist.  States that she will contact Dr. Nicki Reaper this afternoon. 4. F/u prn if symptoms worsen or don't improve   Final Clinical Impressions(s) / UC Diagnoses   Final diagnoses:  None    ED Discharge Orders    None       Controlled Substance Prescriptions [place] Controlled Substance Registry consulted? Not Applicable   Lorin Picket, PA-C 06/20/17 1719

## 2017-06-20 NOTE — Telephone Encounter (Signed)
Copied from [place] [id number]. Topic: Inquiry >> Jun 20, 2017 11:07 AM Conception Chancy, NT wrote: Reason for CRM: patient is calling and states that she was seen on 06/14/17. She states she did a urine test and the test was negative. She states she is still having frequent urination and is having burning while urinating. She would like to know what she should do.

## 2017-06-20 NOTE — ED Triage Notes (Signed)
Pt was seen at PCP on 4/10 and had urine testing done which was negative. Has been having frequency of urination which as worsened.

## 2017-06-20 NOTE — Telephone Encounter (Signed)
Spoke with Jana Half, patients sister, she is going to [facility] to be evaluated.

## 2017-06-20 NOTE — Telephone Encounter (Signed)
Copied from [place] [id number]. Topic: Inquiry >> Jun 20, 2017 11:07 AM Diana Lester, NT wrote: Reason for CRM: patient is calling and states that she was seen on 06/14/17. She states she did a urine test and the test was negative. She states she is still having frequent urination and is having burning while urinating. She would like to know what she should do.  >> Jun 20, 2017  4:09 PM Ahmed Prima L wrote: Patient went to the walk in clinic & they told her that her urine was negative & they did no culture. They did ask her to contact her primary for recommendation on the next step

## 2017-06-21 ENCOUNTER — Telehealth: Payer: Self-pay

## 2017-06-21 NOTE — Telephone Encounter (Signed)
° °  Pt said she went to the walk clinic at [facility] and was told her urine was neg but they did not do a culture told her to follow up with her pcp. They did not give her no medicine. She said she is still having burning and urinating.   Call pt back at [phone]

## 2017-06-21 NOTE — Telephone Encounter (Signed)
Pt scheduled  

## 2017-06-21 NOTE — Telephone Encounter (Signed)
Pt scheduled for 1:00 tomorrow see Dr. Bary Leriche other message.

## 2017-06-21 NOTE — Telephone Encounter (Signed)
It looks as though patient was seen in ED for these symptoms, I called and left a voicemail for patient to call and confirm.  Copied from [place] [phone]. Topic: Inquiry >> Jun 20, 2017 11:07 AM Diana Lester, NT wrote: Reason for CRM: patient is calling and states that she was seen on 06/14/17. She states she did a urine test and the test was negative. She states she is still having frequent urination and is having burning while urinating. She would like to know what she should do.  >> Jun 20, 2017  4:09 PM Ahmed Prima L wrote: Patient went to the walk in clinic & they told her that her urine was negative & they did no culture. They did ask her to contact her primary for recommendation on the next step >> Jun 21, 2017 11:32 AM Ahmed Prima L wrote: Patient is calling for an update. She said she is still hurting and would like to be called today, please.

## 2017-06-22 ENCOUNTER — Other Ambulatory Visit (HOSPITAL_COMMUNITY)
Admission: RE | Admit: 2017-06-22 | Discharge: 2017-06-22 | Disposition: A | Discharge status: Home / Self Care | Payer: PPO | Source: Ambulatory Visit | Attending: Internal Medicine | Admitting: Internal Medicine

## 2017-06-22 ENCOUNTER — Ambulatory Visit (INDEPENDENT_AMBULATORY_CARE_PROVIDER_SITE_OTHER): Payer: PPO | Admitting: Internal Medicine

## 2017-06-22 VITALS — BP 128/80 | HR 85 | Temp 98.5°F | Resp 18 | Wt 132.4 lb

## 2017-06-22 DIAGNOSIS — F329 Major depressive disorder, single episode, unspecified: Secondary | ICD-10-CM | POA: Diagnosis not present

## 2017-06-22 DIAGNOSIS — R300 Dysuria: Secondary | ICD-10-CM | POA: Insufficient documentation

## 2017-06-22 DIAGNOSIS — M48061 Spinal stenosis, lumbar region without neurogenic claudication: Secondary | ICD-10-CM | POA: Insufficient documentation

## 2017-06-22 DIAGNOSIS — I10 Essential (primary) hypertension: Secondary | ICD-10-CM | POA: Diagnosis not present

## 2017-06-22 DIAGNOSIS — Z853 Personal history of malignant neoplasm of breast: Secondary | ICD-10-CM | POA: Diagnosis not present

## 2017-06-22 DIAGNOSIS — R350 Frequency of micturition: Secondary | ICD-10-CM

## 2017-06-22 DIAGNOSIS — Z85820 Personal history of malignant melanoma of skin: Secondary | ICD-10-CM | POA: Insufficient documentation

## 2017-06-22 DIAGNOSIS — N898 Other specified noninflammatory disorders of vagina: Secondary | ICD-10-CM | POA: Diagnosis not present

## 2017-06-22 DIAGNOSIS — E7800 Pure hypercholesterolemia, unspecified: Secondary | ICD-10-CM | POA: Insufficient documentation

## 2017-06-22 LAB — POCT URINALYSIS DIP (MANUAL ENTRY)
BILIRUBIN UA: NEGATIVE
BILIRUBIN UA: NEGATIVE mg/dL
Blood, UA: NEGATIVE
Glucose, UA: NEGATIVE mg/dL
LEUKOCYTES UA: NEGATIVE
Nitrite, UA: NEGATIVE
PH UA: 5.5 (ref 5.0–8.0)
Protein Ur, POC: NEGATIVE mg/dL
Spec Grav, UA: <=1.005 — AB (ref 1.010–1.025)
Urobilinogen, UA: 0.2 E.U./dL

## 2017-06-22 LAB — URINALYSIS, MICROSCOPIC ONLY: RBC / HPF: NONE SEEN (ref 0–?)

## 2017-06-22 MED ORDER — PHENAZOPYRIDINE HCL 200 MG PO TABS: 200.0000 mg | ORAL_TABLET | Freq: Three times a day (TID) | ORAL | 0 refills | Status: DC | PRN

## 2017-06-22 NOTE — Progress Notes (Signed)
toloPatient ID: Diana Lester, female   DOB: [birth year], 84 y.o.   MRN: [id number]   Subjective:    [name], female    DOB: 02-Oct-1930, 84 y.o.   MRN: [id number]  HPI  Patient here as a work in with concerns regarding some increased urinary frequencty and some burning with urination.  May not be emptying bladder fully.  No fever.  Eating and drinking.  No nausea or vomiting.  No abdominal pain.  Bowels moving.     Past Medical History:  Diagnosis Date  . Arthritis   . Breast cancer ([place]) 1995   LT LUMPECTOMY & s/p XRT  . Depression   . Hx of colonic polyp   . Hypercholesterolemia   . Hypertension   . Melanoma ([place])   . Spinal stenosis    lumbar   Past Surgical History:  Procedure Laterality Date  . BREAST BIOPSY Left 2006   EXCISIONAL - NEG  . BREAST BIOPSY Left 1995   LT LUMPECTOMY FOR CA  . BREAST LUMPECTOMY Left 1995  . EYE SURGERY Bilateral    cataract excision  . LAPAROSCOPIC APPENDECTOMY N/A 01/11/2016   Procedure: APPENDECTOMY LAPAROSCOPIC;  Surgeon: Dia Crawford III, MD;  Location: [facility] ORS;  Service: General;  Laterality: N/A;   Family History  Problem Relation Age of Onset  . Arthritis Mother   . Hyperlipidemia Mother   . Hypertension Mother   . Hyperlipidemia Sister   . Kidney cancer Sister   . Breast cancer Neg Hx    Social History   Socioeconomic History  . Marital status: Widowed    Spouse name: Not on file  . Number of children: 2  . Years of education: Not on file  . Highest education level: Not on file  Occupational History  . Not on file  Social Needs  . Financial resource strain: Not on file  . Food insecurity:    Worry: Not on file    Inability: Not on file  . Transportation needs:    Medical: Not on file    Non-medical: Not on file  Tobacco Use  . Smoking status: Former Smoker    Packs/day: 0.50    Years: 4.00    Pack years: 2.00    Types: Cigarettes    Last attempt to quit: 12/09/1984    Years since quitting:  32.5  . Smokeless tobacco: Never Used  . Tobacco comment: quit 12/09/84  Substance and Sexual Activity  . Alcohol use: No    Alcohol/week: 0.0 oz    Comment: sparingly  . Drug use: No  . Sexual activity: Never  Lifestyle  . Physical activity:    Days per week: Not on file    Minutes per session: Not on file  . Stress: Not on file  Relationships  . Social connections:    Talks on phone: Not on file    Gets together: Not on file    Attends religious service: Not on file    Active member of club or organization: Not on file    Attends meetings of clubs or organizations: Not on file    Relationship status: Not on file  Other Topics Concern  . Not on file  Social History Narrative  . Not on file    Outpatient Encounter Medications as of 06/22/2017  Medication Sig  . aspirin 81 MG tablet Take 81 mg by mouth daily.  . Calcium Carb-Cholecalciferol (CALCIUM-VITAMIN D) 500-200 MG-UNIT tablet Take 2 tablets by mouth.   Marland Kitchen  clonazePAM (KLONOPIN) 0.5 MG tablet Take 1 tablet (0.5 mg total) by mouth daily as needed for anxiety.  Marland Kitchen lisinopril-hydrochlorothiazide (PRINZIDE,ZESTORETIC) 20-12.5 MG tablet Take 1 tablet by mouth daily.  Marland Kitchen lovastatin (MEVACOR) 40 MG tablet TAKE 1-2 TABLETS BY MOUTH DAILY  . mirtazapine (REMERON) 30 MG tablet TAKE 1 TABLET BY MOUTH AT BEDTIME  . Multiple Vitamin (MULTIVITAMIN) LIQD Take 5 mLs by mouth daily.  . phenazopyridine (PYRIDIUM) 200 MG tablet Take 1 tablet (200 mg total) by mouth 3 (three) times daily as needed for pain.  . pregabalin (LYRICA) 50 MG capsule Take 50 mg by mouth 2 (two) times daily.    No facility-administered encounter medications on file as of 06/22/2017.     Review of Systems  Constitutional: Negative for appetite change and unexpected weight change.  Respiratory: Negative for shortness of breath.   Cardiovascular: Negative for chest pain and leg swelling.  Gastrointestinal: Negative for abdominal pain, diarrhea, nausea and vomiting.    Genitourinary: Positive for frequency. Negative for difficulty urinating.       Burning with urination at times.  Question - vaginal burning.    Musculoskeletal: Negative for joint swelling and myalgias.  Skin: Negative for color change and rash.  Neurological: Negative for light-headedness and headaches.  Psychiatric/Behavioral:       Increased stress related to her urinary symptoms.         Objective:    Physical Exam  Constitutional: She appears well-developed and well-nourished. No distress.  Cardiovascular: Normal rate and regular rhythm.  Pulmonary/Chest: Breath sounds normal. No respiratory distress. She has no wheezes.  Abdominal: Soft. Bowel sounds are normal. There is no tenderness.  Genitourinary:  Genitourinary Comments: Normal external genitalia.  Vaginal vault without lesions.  Could not appreciate any adnexal masses or tenderness.  KOH/wet prep obtained.    Musculoskeletal: She exhibits no edema or tenderness.  No CVA tenderness.    Skin: No rash noted. No erythema.    BP 128/80 (BP Location: Right Arm)   Pulse 85   Temp 98.5 F (36.9 C) (Oral)   Resp 18   Wt 132 lb 6.4 oz (60.1 kg)   SpO2 96%   BMI 23.45 kg/m  Wt Readings from Last 3 Encounters:  06/22/17 132 lb 6.4 oz (60.1 kg)  06/20/17 133 lb (60.3 kg)  06/14/17 134 lb (60.8 kg)     Lab Results  Component Value Date   WBC 7.6 05/23/2017   HGB 15.5 (H) 06/14/2017   HCT 46.5 (H) 05/23/2017   PLT 276.0 05/23/2017   GLUCOSE 132 (H) 05/23/2017   CHOL 224 (H) 05/23/2017   TRIG 167.0 (H) 05/23/2017   HDL 68.00 05/23/2017   LDLDIRECT 126.7 04/17/2013   LDLCALC 123 (H) 05/23/2017   ALT 12 05/23/2017   AST 19 05/23/2017   NA 141 05/23/2017   K 4.2 05/23/2017   CL 103 05/23/2017   CREATININE 1.39 (H) 05/23/2017   BUN 21 05/23/2017   CO2 27 05/23/2017   TSH 2.89 06/14/2017       Assessment & Plan:   Problem List Items Addressed This Visit    Essential hypertension, benign    Blood pressure  under good control.  Continue same medication regimen.  Follow pressures.  Follow metabolic panel.        Urinary frequency    Persistent symptoms.  Urine dip negative.  Wet prep pending.  Some urinary discomfort persist.  Treat with pyridium.  Await culture results.  If persistent fullness/pressure, will  require further w/up (i.e. possible pelvic ultrasound, etc.).       Relevant Orders   Urine Microscopic Only (Completed)   Urine Culture (Completed)    Other Visit Diagnoses    Dysuria    -  Primary   Relevant Orders   POCT urinalysis dipstick (Completed)   Vaginal discharge       Relevant Orders   Cervicovaginal ancillary only (Completed)       Einar Pheasant, MD

## 2017-06-23 LAB — CERVICOVAGINAL ANCILLARY ONLY: Wet Prep (BD Affirm): NEGATIVE

## 2017-06-23 LAB — URINE CULTURE
MICRO NUMBER: 90473088
Result:: NO GROWTH
SPECIMEN QUALITY:: ADEQUATE

## 2017-06-25 ENCOUNTER — Encounter: Payer: Self-pay | Admitting: Internal Medicine

## 2017-06-25 DIAGNOSIS — R350 Frequency of micturition: Secondary | ICD-10-CM | POA: Insufficient documentation

## 2017-06-25 NOTE — Assessment & Plan Note (Signed)
Blood pressure under good control.  Continue same medication regimen.  Follow pressures.  Follow metabolic panel.   

## 2017-06-25 NOTE — Assessment & Plan Note (Signed)
Persistent symptoms.  Urine dip negative.  Wet prep pending.  Some urinary discomfort persist.  Treat with pyridium.  Await culture results.  If persistent fullness/pressure, will require further w/up (i.e. possible pelvic ultrasound, etc.).

## 2017-06-27 ENCOUNTER — Telehealth: Payer: Self-pay

## 2017-06-27 NOTE — Telephone Encounter (Signed)
Pt notified & would like to start with the urology referral first.

## 2017-06-27 NOTE — Telephone Encounter (Signed)
Notify pt that given her persistent urinary frequency, I would like to schedule her to see urology for further evaluation.  Also, if agreeable, I would like to schedule her for a pelvic ultrasound.  If agreeable, let me know and I will place the order.

## 2017-06-27 NOTE — Telephone Encounter (Signed)
Called patient to see what symptoms she was having and she states that she is still having frequent urination, she did not mention any other symptoms. According to the urine culture it looks like it was negative. Patient wants to know what else could be the issue for the frequent urination. Copied from [place] [id number]. Topic: Inquiry >> Jun 20, 2017 11:07 AM Conception Chancy, NT wrote: Reason for CRM: patient is calling and states that she was seen on 06/14/17. She states she did a urine test and the test was negative. She states she is still having frequent urination and is having burning while urinating. She would like to know what she should do.  >> Jun 20, 2017  4:09 PM Ahmed Prima L wrote: Patient went to the walk in clinic & they told her that her urine was negative & they did no culture. They did ask her to contact her primary for recommendation on the next step >> Jun 21, 2017 11:32 AM Ahmed Prima L wrote: Patient is calling for an update. She said she is still hurting and would like to be called today, please.  >> Jun 27, 2017 11:59 AM Vernona Rieger wrote: Patient said she wants Dr Bary Leriche nurse to call her back. She said she is still urinating a lot. [phone]

## 2017-06-28 ENCOUNTER — Telehealth: Payer: Self-pay | Admitting: Internal Medicine

## 2017-06-28 ENCOUNTER — Other Ambulatory Visit: Payer: Self-pay | Admitting: Internal Medicine

## 2017-06-28 DIAGNOSIS — R300 Dysuria: Secondary | ICD-10-CM

## 2017-06-28 DIAGNOSIS — R350 Frequency of micturition: Secondary | ICD-10-CM

## 2017-06-28 NOTE — Telephone Encounter (Signed)
Can we try to get her in with another urologist?

## 2017-06-28 NOTE — Progress Notes (Signed)
Order placed for urology referral.  

## 2017-06-28 NOTE — Telephone Encounter (Signed)
Please advise 

## 2017-06-28 NOTE — Telephone Encounter (Unsigned)
Copied from [place] [phone]. Topic: Quick Communication - See Telephone Encounter >> Jun 28, 2017  3:05 PM Neva Seat wrote: Pt states she cannot be seen by the urologist until June 3.  Pt cannot wait until then, how can she get help or relief asap? Please call pt to discuss.

## 2017-06-28 NOTE — Telephone Encounter (Signed)
Order placed for urology referral.  

## 2017-06-30 ENCOUNTER — Telehealth: Payer: Self-pay | Admitting: Internal Medicine

## 2017-06-30 NOTE — Telephone Encounter (Signed)
I did a pelvic exam and check urine for infection.  No clear etiology for her pain.  I talked to [facility] and they have her on a cancellation list with urology.  Also, will see if can get an eariler appt.  While waiting, I would like to order a pelvic ultrasound to better evaluate the pain and discomfort.  If agreeable, let me know and I will place the order.

## 2017-06-30 NOTE — Telephone Encounter (Signed)
Copied from [place] [phone]. Topic: General - Other >> Jun 30, 2017 12:05 PM Darl Householder, RMA wrote: Reason for CRM: Patient is requesting a call back from Dr. Nicki Reaper or CMA concerning pelvic pain

## 2017-06-30 NOTE — Telephone Encounter (Signed)
Patient agreeable to pelvic US. Advised that someone would be calling to schedule

## 2017-06-30 NOTE — Telephone Encounter (Signed)
Please advise. Patient has had her urine checked twice for UTI and we did a wet prep the last time she was here.

## 2017-06-30 NOTE — Telephone Encounter (Signed)
Patient states that she is having frequent urination with burning for a few days. She states that she has not seen any blood but she is also having pain that starts in her abdomen then radiates down to her vagina.She states she is suppose to see Urology in June but the discomfort and frequent urination has gotten worse. Please advise

## 2017-07-01 ENCOUNTER — Encounter: Payer: Self-pay | Admitting: Emergency Medicine

## 2017-07-01 ENCOUNTER — Emergency Department
Admission: EM | Admit: 2017-07-01 | Discharge: 2017-07-01 | Disposition: A | Discharge status: Home / Self Care | Payer: PPO | Attending: Emergency Medicine | Admitting: Emergency Medicine

## 2017-07-01 ENCOUNTER — Other Ambulatory Visit: Payer: Self-pay

## 2017-07-01 ENCOUNTER — Telehealth: Payer: Self-pay | Admitting: Internal Medicine

## 2017-07-01 ENCOUNTER — Other Ambulatory Visit: Payer: Self-pay | Admitting: Internal Medicine

## 2017-07-01 DIAGNOSIS — I129 Hypertensive chronic kidney disease with stage 1 through stage 4 chronic kidney disease, or unspecified chronic kidney disease: Secondary | ICD-10-CM | POA: Insufficient documentation

## 2017-07-01 DIAGNOSIS — Z87891 Personal history of nicotine dependence: Secondary | ICD-10-CM | POA: Insufficient documentation

## 2017-07-01 DIAGNOSIS — R300 Dysuria: Secondary | ICD-10-CM | POA: Diagnosis not present

## 2017-07-01 DIAGNOSIS — Z853 Personal history of malignant neoplasm of breast: Secondary | ICD-10-CM | POA: Diagnosis not present

## 2017-07-01 DIAGNOSIS — N183 Chronic kidney disease, stage 3 (moderate): Secondary | ICD-10-CM | POA: Diagnosis not present

## 2017-07-01 DIAGNOSIS — Z79899 Other long term (current) drug therapy: Secondary | ICD-10-CM | POA: Diagnosis not present

## 2017-07-01 DIAGNOSIS — R350 Frequency of micturition: Secondary | ICD-10-CM

## 2017-07-01 DIAGNOSIS — Z7982 Long term (current) use of aspirin: Secondary | ICD-10-CM | POA: Insufficient documentation

## 2017-07-01 DIAGNOSIS — R102 Pelvic and perineal pain: Secondary | ICD-10-CM

## 2017-07-01 LAB — URINALYSIS, COMPLETE (UACMP) WITH MICROSCOPIC
BACTERIA UA: NONE SEEN
Bilirubin Urine: NEGATIVE
Glucose, UA: NEGATIVE mg/dL
Hgb urine dipstick: NEGATIVE
Ketones, ur: NEGATIVE mg/dL
Leukocytes, UA: NEGATIVE
Nitrite: NEGATIVE
PROTEIN: NEGATIVE mg/dL
SQUAMOUS EPITHELIAL / LPF: NONE SEEN (ref 0–5)
Specific Gravity, Urine: 1.01 (ref 1.005–1.030)
pH: 6 (ref 5.0–8.0)

## 2017-07-01 NOTE — ED Notes (Signed)
MD at bedside. PT reports having multiple urinalysis and urine cultures performed due to continued urine frequency and pressure in bladder. Pt reports small amount of burning when urinating but denies pain. No flank pain reported and no fevers noted. No NVD. No abd pain. No blood in urine

## 2017-07-01 NOTE — Telephone Encounter (Signed)
Notify her that I have placed order for gyn referral.  They can evaluate and do pelvic ultrasound if warranted. Will b called with appt.

## 2017-07-01 NOTE — Progress Notes (Signed)
Order placed for gyn evaluation.

## 2017-07-01 NOTE — ED Triage Notes (Signed)
Pt to ED via POV for urinary burning and frequency. Pt has been seen by her PCP, [facility], Pt was unable to get into OBGYN or urology so she came to the ED. Pt in NAD at this time.

## 2017-07-01 NOTE — ED Provider Notes (Signed)
[facility] Emergency Department Provider Note  ____________________________________________   I have reviewed the triage vital signs and the nursing notes.   HISTORY  Chief Complaint Dysuria   History limited by: Not Limited   HPI Diana Lester is a 84 y.o. female who presents to the emergency department today because of concern for continued dysuria and lower abdominal pain. The patients symptoms have been present for weeks. She has been working with her primary care physician for these symptoms and has had negative urine testing. PCP has referred patient to urology and ob/gyn but patient states it will be some time before she can see the specialists.    Per medical record review patient has a history of recent PCP visits for similar symptoms.   Past Medical History:  Diagnosis Date  . Arthritis   . Breast cancer ([place]) 1995   LT LUMPECTOMY & s/p XRT  . Depression   . Hx of colonic polyp   . Hypercholesterolemia   . Hypertension   . Melanoma ([place])   . Spinal stenosis    lumbar    Patient Active Problem List   Diagnosis Date Noted  . Urinary frequency 06/25/2017  . Lumbar spinal stenosis 01/23/2016  . Hypertension 01/23/2016  . History of depression 01/23/2016  . 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 Right lower quadrant abdominal pain   . Anxiety 10/20/2015  . Loss of weight 10/20/2015  . Hyponatremia 08/07/2014  . Health care maintenance 05/05/2014  . Skin cancer 01/29/2014  . Other malaise and fatigue 10/21/2013  . Skin lesion of cheek 10/21/2013  . Renal cyst 03/22/2013  . Chronic kidney disease, stage III (moderate) ([place]) 03/14/2013  . Abdominal bruit 03/14/2013  . Spinal stenosis of lumbar region 12/09/2012  . Bulging disc 12/09/2012  . History of colonic polyps 12/09/2012  . History of breast cancer 12/09/2012  . Depression 12/09/2012  . Essential hypertension, benign 12/09/2012  . Hyperlipidemia 12/09/2012    Past  Surgical History:  Procedure Laterality Date  . BREAST BIOPSY Left 2006   EXCISIONAL - NEG  . BREAST BIOPSY Left 1995   LT LUMPECTOMY FOR CA  . BREAST LUMPECTOMY Left 1995  . EYE SURGERY Bilateral    cataract excision  . LAPAROSCOPIC APPENDECTOMY N/A 01/11/2016   Procedure: APPENDECTOMY LAPAROSCOPIC;  Surgeon: Dia Crawford III, MD;  Location: [facility] ORS;  Service: General;  Laterality: N/A;    Prior to Admission medications   Medication Sig Start Date End Date Taking? Authorizing Provider  aspirin 81 MG tablet Take 81 mg by mouth daily.    [provider]  Calcium Carb-Cholecalciferol (CALCIUM-VITAMIN D) 500-200 MG-UNIT tablet Take 2 tablets by mouth.     [provider]  clonazePAM (KLONOPIN) 0.5 MG tablet Take 1 tablet (0.5 mg total) by mouth daily as needed for anxiety. 06/17/17   Einar Pheasant, MD  lisinopril-hydrochlorothiazide (PRINZIDE,ZESTORETIC) 20-12.5 MG tablet Take 1 tablet by mouth daily. 05/24/17   Einar Pheasant, MD  lovastatin (MEVACOR) 40 MG tablet TAKE 1-2 TABLETS BY MOUTH DAILY 03/28/17   Einar Pheasant, MD  mirtazapine (REMERON) 30 MG tablet TAKE 1 TABLET BY MOUTH AT BEDTIME 06/15/17   Einar Pheasant, MD  Multiple Vitamin (MULTIVITAMIN) LIQD Take 5 mLs by mouth daily.    [provider]  phenazopyridine (PYRIDIUM) 200 MG tablet Take 1 tablet (200 mg total) by mouth 3 (three) times daily as needed for pain. 06/22/17   Einar Pheasant, MD  pregabalin (LYRICA) 50 MG capsule Take 50  mg by mouth 2 (two) times daily.     [provider]    Allergies Contrast media [iodinated diagnostic agents]  Family History  Problem Relation Age of Onset  . Arthritis Mother   . Hyperlipidemia Mother   . Hypertension Mother   . Hyperlipidemia Sister   . Kidney cancer Sister   . Breast cancer Neg Hx     Social History Social History   Tobacco Use  . Smoking status: Former Smoker    Packs/day: 0.50    Years: 4.00    Pack years: 2.00    Types:  Cigarettes    Last attempt to quit: 12/09/1984    Years since quitting: 32.5  . Smokeless tobacco: Never Used  . Tobacco comment: quit 12/09/84  Substance Use Topics  . Alcohol use: No    Alcohol/week: 0.0 oz    Comment: sparingly  . Drug use: No    Review of Systems Constitutional: No fever/chills Eyes: No visual changes. ENT: No sore throat. Cardiovascular: Denies chest pain. Respiratory: Denies shortness of breath. Gastrointestinal: Positive for lower abdominal pain. Genitourinary: Positive for dysuria.  Musculoskeletal: Positive for chronic back pain. Skin: Negative for rash. Neurological: Negative for headaches, focal weakness or numbness.  ____________________________________________   PHYSICAL EXAM:  VITAL SIGNS: ED Triage Vitals  Enc Vitals Group     BP 07/01/17 1519 136/68     Pulse Rate 07/01/17 1519 89     Resp 07/01/17 1519 16     Temp 07/01/17 1519 98.5 F (36.9 C)     Temp Source 07/01/17 1519 Oral     SpO2 07/01/17 1519 95 %     Weight 07/01/17 1520 132 lb (59.9 kg)     Height 07/01/17 1520 5\' 3"  (1.6 m)     Head Circumference --      Peak Flow --      Pain Score 07/01/17 1520 8   Constitutional: Alert and oriented. Well appearing and in no distress. Eyes: Conjunctivae are normal.  ENT   Head: Normocephalic and atraumatic.   Nose: No congestion/rhinnorhea.   Mouth/Throat: Mucous membranes are moist.   Neck: No stridor. Hematological/Lymphatic/Immunilogical: No cervical lymphadenopathy. Cardiovascular: Normal rate, regular rhythm.  No murmurs, rubs, or gallops.  Respiratory: Normal respiratory effort without tachypnea nor retractions. Breath sounds are clear and equal bilaterally. No wheezes/rales/rhonchi. Gastrointestinal: Soft and minimally tender in the lower abdomen. No rebound. No guarding.  Genitourinary: Deferred Musculoskeletal: Normal range of motion in all extremities. No lower extremity edema. Neurologic:  Normal speech and  language. No gross focal neurologic deficits are appreciated.  Skin:  Skin is warm, dry and intact. No rash noted. Psychiatric: Mood and affect are normal. Speech and behavior are normal. Patient exhibits appropriate insight and judgment.  ____________________________________________    LABS (pertinent positives/negatives)  UA not consistent with infection  ____________________________________________   EKG  None  ____________________________________________    RADIOLOGY  None  ____________________________________________   PROCEDURES  Procedures  ____________________________________________   INITIAL IMPRESSION / ASSESSMENT AND PLAN / ED COURSE  Pertinent labs & imaging results that were available during my care of the patient were reviewed by me and considered in my medical decision making (see chart for details).  Patient presented to the emergency department today because of concerns for continued dysuria and lower abdominal pain.  Patient has had this symptoms for weeks and has been working with primary care.  They are trying to follow-up with urology.  On exam patient appears well.  Very mild tenderness to the lower abdomen.  Bladder scan revealed less than 100 cc of urine.  Did discuss with patient and offered to perform pelvic ultrasound.  Patient however declined to have that performed here in the emergency department.  UA does not show concerning signs for infection.  At this point unclear etiology of the patient's discomfort.  Do think it is reasonable for patient to continue outpatient work-up with primary care physician.  ____________________________________________   FINAL CLINICAL IMPRESSION(S) / ED DIAGNOSES  Final diagnoses:  Urinary frequency     Note: This dictation was prepared with Dragon dictation. Any transcriptional errors that result from this process are unintentional     Nance Pear, MD 07/01/17 1816

## 2017-07-01 NOTE — ED Notes (Signed)
PT and family report they want to leave without an ultrasound.

## 2017-07-01 NOTE — Telephone Encounter (Signed)
Patient aware.

## 2017-07-01 NOTE — ED Notes (Signed)
Performed a bladder scan, 49cc noted.

## 2017-07-01 NOTE — Discharge Instructions (Addendum)
Please seek medical attention for any high fevers, chest pain, shortness of breath, change in behavior, persistent vomiting, bloody stool or any other new or concerning symptoms.  

## 2017-07-01 NOTE — Telephone Encounter (Unsigned)
Copied from [place] [id number]. Topic: Quick Communication - See Telephone Encounter >> Jul 01, 2017  2:44 PM Neva Seat wrote: Pt is going to hospital for vagina issues today.   Pt said to let Garnette Scheuermann know to not pursue the ultrasound referral they had discussed.

## 2017-07-01 NOTE — ED Notes (Signed)
Pt reports feeling better and verbalized plan for follow up on Monday. PT ambulatory and in NAD.

## 2017-07-04 ENCOUNTER — Encounter: Payer: Self-pay | Admitting: Obstetrics and Gynecology

## 2017-07-04 ENCOUNTER — Ambulatory Visit (INDEPENDENT_AMBULATORY_CARE_PROVIDER_SITE_OTHER): Payer: PPO | Admitting: Obstetrics and Gynecology

## 2017-07-04 ENCOUNTER — Encounter

## 2017-07-04 VITALS — BP 122/70 | HR 85 | Ht 63.0 in | Wt 135.0 lb

## 2017-07-04 DIAGNOSIS — R350 Frequency of micturition: Secondary | ICD-10-CM

## 2017-07-04 DIAGNOSIS — R102 Pelvic and perineal pain: Secondary | ICD-10-CM

## 2017-07-04 LAB — POCT URINALYSIS DIPSTICK
BILIRUBIN UA: NEGATIVE
Blood, UA: NEGATIVE
GLUCOSE UA: NEGATIVE
KETONES UA: NEGATIVE
Leukocytes, UA: NEGATIVE
Nitrite, UA: NEGATIVE
SPEC GRAV UA: 1.015 (ref 1.010–1.025)
Urobilinogen, UA: NEGATIVE E.U./dL — AB
pH, UA: 5 (ref 5.0–8.0)

## 2017-07-04 MED ORDER — ESTROGENS, CONJUGATED 0.625 MG/GM VA CREA: TOPICAL_CREAM | VAGINAL | 0 refills | Status: DC

## 2017-07-04 NOTE — Telephone Encounter (Signed)
Pt was seen by obgyn today

## 2017-07-04 NOTE — Patient Instructions (Signed)
Atrophic Vaginitis Atrophic vaginitis is a condition in which the tissues that line the vagina become dry and thin. This condition is most common in women who have stopped having regular menstrual periods (menopause). This usually starts when a woman is 45-82 years old. Estrogen helps to keep the vagina moist. It stimulates the vagina to produce a clear fluid that lubricates the vagina for sexual intercourse. This fluid also protects the vagina from infection. Lack of estrogen can cause the lining of the vagina to get thinner and dryer. The vagina may also shrink in size. It may become less elastic. Atrophic vaginitis tends to get worse over time as a woman's estrogen level drops. What are the causes? This condition is caused by the normal drop in estrogen that happens around the time of menopause. What increases the risk? Certain conditions or situations may lower a woman's estrogen level, which increases her risk of atrophic vaginitis. These include:  Taking medicine that blocks estrogen.  Having ovaries removed surgically.  Being treated for cancer with X-ray treatment (radiation) or medicines (chemotherapy).  Exercising very hard and often.  Having an eating disorder (anorexia).  Giving birth or breastfeeding.  Being over the age of 50.  Smoking.  What are the signs or symptoms? Symptoms of this condition include:  Pain, soreness, or bleeding during sexual intercourse (dyspareunia).  Vaginal burning, irritation, or itching.  Pain or bleeding during a vaginal examination using a speculum (pelvic exam).  Loss of interest in sexual activity.  Having burning pain when passing urine.  Vaginal discharge that is brown or yellow.  In some cases, there are no symptoms. How is this diagnosed? This condition is diagnosed with a medical history and physical exam. This will include a pelvic exam that checks whether the inside of your vagina appears pale, thin, or dry. Rarely, you may  also have other tests, including:  A urine test.  A test that checks the acid balance in your vaginal fluid (acid balance test).  How is this treated? Treatment for this condition may depend on the severity of your symptoms. Treatment may include:  Using an over-the-counter vaginal lubricant before you have sexual intercourse.  Using a long-acting vaginal moisturizer.  Using low-dose vaginal estrogen for moderate to severe symptoms that do not respond to other treatments. Options include creams, tablets, and inserts (vaginal rings). Before using vaginal estrogen, tell your health care provider if you have a history of: ? Breast cancer. ? Endometrial cancer. ? Blood clots.  Taking medicines. You may be able to take a daily pill for dyspareunia. Discuss all of the risks of this medicine with your health care provider. It is usually not recommended for women who have a family history or personal history of breast cancer.  If your symptoms are very mild and you are not sexually active, you may not need treatment. Follow these instructions at home:  Take medicines only as directed by your health care provider. Do not use herbal or alternative medicines unless your health care provider says that you can.  Use over-the-counter creams, lubricants, or moisturizers for dryness only as directed by your health care provider.  If your atrophic vaginitis is caused by menopause, discuss all of your menopausal symptoms and treatment options with your health care provider.  Do not douche.  Do not use products that can make your vagina dry. These include: ? Scented feminine sprays. ? Scented tampons. ? Scented soaps.  If it hurts to have sex, talk with your sexual   partner. Contact a health care provider if:  Your discharge looks different than normal.  Your vagina has an unusual smell.  You have new symptoms.  Your symptoms do not improve with treatment.  Your symptoms get worse. This  information is not intended to replace advice given to you by your health care provider. Make sure you discuss any questions you have with your health care provider. Document Released: 07/09/2014 Document Revised: 07/31/2015 Document Reviewed: 02/13/2014 Elsevier Interactive Patient Education  2018 Elsevier Inc.  

## 2017-07-04 NOTE — Addendum Note (Signed)
Addended by: Dorthula Nettles on: 07/04/2017 05:38 PM   Modules accepted: Level of Service

## 2017-07-04 NOTE — Progress Notes (Addendum)
Obstetrics & Gynecology Office Visit   Chief Complaint:  Chief Complaint  Patient presents with  . Urinary Frequency    [facility] referral  . Pelvic pressure    History of Present Illness:  Diana Lester is a 84 y.o. female who presents in consultation at the request of Dr. Einar Pheasant for evaluation of pelvic pressure and dysuria.   She reports that she has had the above symptoms for 2weeks  Urinary Leakage Symptoms:  She reports that she does not have urinary incontinence.  She does not wear a pad.    Stress incontinence:  She does not report stress urinary leakage  Urgency incontinence: She does not report urgency   Bladder Emptying/Upper Urinary Tract Symptoms:    Voiding frequency/nocturia: She typically urinated once at night  Bladder emptying: She  She does not splint to accomplish voiding.    Upper tract symptoms: Shedenies a history of kidney stones and denies gross hematuria.   UTI: She denies a history of frequent urinary tract infections.  Bulge Symptoms:  She denies pressure/bulge symptoms.    Bowel Symptoms:  She denies issues with constipation  Fecal incontinence: She denies encopresis    Defecatory dysfunction: She does not splint to accomplish a bowel movement.     The patient does have a prior history of breast cancer treated with lumpectomy, unknown BRCA status.  No other family history of breast or ovarian cancer.  She is not currently sexually active.  Denies postmenopausal bleeding or abnormal vaginal discharge.  No early satiety or abdominal bloating.   Review of Systems: Review of Systems  Constitutional: Negative.   HENT: Negative.   Eyes: Negative.   Cardiovascular: Negative.   Genitourinary: Positive for dysuria and frequency. Negative for flank pain, hematuria and urgency.  Musculoskeletal: Negative.   Skin: Negative.   Neurological: Negative.   Endo/Heme/Allergies: Negative.   Psychiatric/Behavioral: Negative.     Past Medical  History:  Past Medical History:  Diagnosis Date  . Arthritis   . Breast cancer ([place]) 1995   LT LUMPECTOMY & s/p XRT  . Depression   . Hx of colonic polyp   . Hypercholesterolemia   . Hypertension   . Melanoma ([place])   . Spinal stenosis    lumbar    Past Surgical History:  Past Surgical History:  Procedure Laterality Date  . BREAST BIOPSY Left 2006   EXCISIONAL - NEG  . BREAST BIOPSY Left 1995   LT LUMPECTOMY FOR CA  . BREAST LUMPECTOMY Left 1995  . EYE SURGERY Bilateral    cataract excision  . LAPAROSCOPIC APPENDECTOMY N/A 01/11/2016   Procedure: APPENDECTOMY LAPAROSCOPIC;  Surgeon: Dia Crawford III, MD;  Location: [facility] ORS;  Service: General;  Laterality: N/A;    Gynecologic History: No LMP recorded (exact date). Patient is postmenopausal.  Obstetric History: No obstetric history on file.  Family History:  Family History  Problem Relation Age of Onset  . Arthritis Mother   . Hyperlipidemia Mother   . Hypertension Mother   . Hyperlipidemia Sister   . Kidney cancer Sister   . Breast cancer Neg Hx     Social History:  Social History   Socioeconomic History  . Marital status: Widowed    Spouse name: Not on file  . Number of children: 2  . Years of education: Not on file  . Highest education level: Not on file  Occupational History  . Not on file  Social Needs  . Financial resource strain: Not on  file  . Food insecurity:    Worry: Not on file    Inability: Not on file  . Transportation needs:    Medical: Not on file    Non-medical: Not on file  Tobacco Use  . Smoking status: Former Smoker    Packs/day: 0.50    Years: 4.00    Pack years: 2.00    Types: Cigarettes    Last attempt to quit: 12/09/1984    Years since quitting: 32.5  . Smokeless tobacco: Never Used  . Tobacco comment: quit 12/09/84  Substance and Sexual Activity  . Alcohol use: No    Alcohol/week: 0.0 oz    Comment: sparingly  . Drug use: No  . Sexual activity: Never  Lifestyle  .  Physical activity:    Days per week: Not on file    Minutes per session: Not on file  . Stress: Not on file  Relationships  . Social connections:    Talks on phone: Not on file    Gets together: Not on file    Attends religious service: Not on file    Active member of club or organization: Not on file    Attends meetings of clubs or organizations: Not on file    Relationship status: Not on file  . Intimate partner violence:    Fear of current or ex partner: Not on file    Emotionally abused: Not on file    Physically abused: Not on file    Forced sexual activity: Not on file  Other Topics Concern  . Not on file  Social History Narrative  . Not on file    Allergies:  Allergies  Allergen Reactions  . Contrast Media [Iodinated Diagnostic Agents] Hives    Medications: Prior to Admission medications   Medication Sig Start Date End Date Taking? Authorizing Provider  aspirin 81 MG tablet Take 81 mg by mouth daily.   Yes [provider]  Calcium Carb-Cholecalciferol (CALCIUM-VITAMIN D) 500-200 MG-UNIT tablet Take 2 tablets by mouth.    Yes [provider]  clonazePAM (KLONOPIN) 0.5 MG tablet Take 1 tablet (0.5 mg total) by mouth daily as needed for anxiety. 06/17/17  Yes Einar Pheasant, MD  lisinopril-hydrochlorothiazide (PRINZIDE,ZESTORETIC) 20-12.5 MG tablet Take 1 tablet by mouth daily. 05/24/17  Yes Einar Pheasant, MD  lovastatin (MEVACOR) 40 MG tablet TAKE 1-2 TABLETS BY MOUTH DAILY 03/28/17  Yes Einar Pheasant, MD  mirtazapine (REMERON) 30 MG tablet TAKE 1 TABLET BY MOUTH AT BEDTIME 06/15/17  Yes Einar Pheasant, MD  pregabalin (LYRICA) 50 MG capsule Take 50 mg by mouth 2 (two) times daily.    Yes [provider]  conjugated estrogens (PREMARIN) vaginal cream 1 gram vaginally nightly at bedtime for 2 weeks, 1 gram vaginally every other night at bedtime for 2 weeks, then 1 gram vaginally twice weekly 07/04/17   Malachy Mood, MD  Multiple Vitamin  (MULTIVITAMIN) LIQD Take 5 mLs by mouth daily.    [provider]  phenazopyridine (PYRIDIUM) 200 MG tablet Take 1 tablet (200 mg total) by mouth 3 (three) times daily as needed for pain. Patient not taking: Reported on 07/04/2017 06/22/17   Einar Pheasant, MD    Physical Exam Blood pressure 122/70, pulse 85, height 5' 3"  (1.6 m), weight 135 lb (61.2 kg).  No LMP recorded (exact date). Patient is postmenopausal.  General: NAD HEENT: normocephalic, anicteric Pulmonary: No increased work of breathing Abdomen: Soft, non-tender, non-distended.  Umbilicus without lesions.  No hepatomegaly, splenomegaly or masses  palpable. No evidence of hernia  Genitourinary:  External: Normal external female genitalia for age with presence of atrophic changes.  Normal urethral meatus, normal Bartholin's and Skene's glands.    Vagina: Normal vaginal mucosa, no evidence of prolapse.    Cervix: Grossly normal in appearance, no bleeding  Uterus: Non-enlarged, mobile, normal contour.  No CMT  Adnexa: ovaries non-enlarged, + right adnexal tenderness  Rectal: deferred  Lymphatic: no evidence of inguinal lymphadenopathy Extremities: no edema, erythema, or tenderness Neurologic: Grossly intact Psychiatric: mood appropriate, affect full  PVR: Not done, but bladder scan in ER 07/01/2017 with PVR <168m  Female chaperone present for pelvic portions of the physical exam  Results for orders placed or performed in visit on 07/04/17 (from the past 24 hour(s))  POCT urinalysis dipstick     Status: Abnormal   Collection Time: 07/04/17 10:03 AM  Result Value Ref Range   Color, UA     Clarity, UA     Glucose, UA negative    Bilirubin, UA negative    Ketones, UA negative    Spec Grav, UA 1.015 1.010 - 1.025   Blood, UA Negative    pH, UA 5.0 5.0 - 8.0   Protein, UA Trace    Urobilinogen, UA negative (A) 0.2 or 1.0 E.U./dL   Nitrite, UA Negative    Leukocytes, UA Negative Negative   Appearance     Odor         Assessment: 84y.o. presenting for evaluation of pelvic pressure and dysuria  Plan: Problem List Items Addressed This Visit      Other   Urinary frequency - Primary   Relevant Orders   POCT urinalysis dipstick (Completed)    Other Visit Diagnoses    Pelvic pressure in female       Relevant Orders   POCT urinalysis dipstick (Completed)   [place]vaginal Non-OB     1) Dysuria - negative UA's 06/22/17, 07/01/17. And today.  Urine culture 07/01/17 negative.  We discussed vaginal atrophy and will trial patient on course of vaginal premarin cream  2) Pelvic pressure - no evidence of pelvic masses or prolapse on exam.  Will obtain TVUS to further evaluate.  No gynecologic pathology noted on CT A/P 01/20/2016.  We discussed low overall concern for malignancy, local recurrence of breast cancer more likely than metastasis and normal mammogram 12/17/17.  Conlonoscopy last done 12/24/2011/  3) A total of 30 minutes were spent in face-to-face contact with the patient during this encounter with over half of that time devoted to counseling and coordination of care.  4) Return in about 1 week (around 07/11/2017) for TVUS and follow up.    AMalachy Mood MD, FLou[facility] PardonOB/GYN, CN[facility] 07/04/2017, 1:39 PM

## 2017-07-08 ENCOUNTER — Encounter: Payer: Self-pay | Admitting: Obstetrics & Gynecology

## 2017-07-11 ENCOUNTER — Ambulatory Visit (INDEPENDENT_AMBULATORY_CARE_PROVIDER_SITE_OTHER): Payer: PPO

## 2017-07-11 ENCOUNTER — Encounter: Payer: Self-pay | Admitting: Obstetrics and Gynecology

## 2017-07-11 ENCOUNTER — Ambulatory Visit (INDEPENDENT_AMBULATORY_CARE_PROVIDER_SITE_OTHER): Payer: PPO | Admitting: Obstetrics and Gynecology

## 2017-07-11 VITALS — BP 130/70 | HR 90 | Wt 136.0 lb

## 2017-07-11 DIAGNOSIS — R300 Dysuria: Secondary | ICD-10-CM | POA: Diagnosis not present

## 2017-07-11 DIAGNOSIS — N952 Postmenopausal atrophic vaginitis: Secondary | ICD-10-CM

## 2017-07-11 DIAGNOSIS — R102 Pelvic and perineal pain: Secondary | ICD-10-CM

## 2017-07-11 NOTE — Progress Notes (Signed)
Gynecology Ultrasound Follow Up  Chief Complaint:  Chief Complaint  Patient presents with  . GYN follow up    U/S     History of Present Illness: Patient is a 84 y.o. female who presents today for ultrasound evaluation of pelvic pressure .  Ultrasound demonstrates the following findgins Adnexa: atrophic, not imaged Uterus: Non-enlarged with thin homogenous endometrial stripe endometrial stripe   Additional: No free fluid.  There is some fluid in the cervix likely secondary to cervical stenosis  Since our last visit she continues to have the same symptoms, no change in pressure or urinary discomfort.    Review of Systems: Review of Systems  Gastrointestinal: Negative.   Genitourinary: Positive for dysuria. Negative for flank pain, frequency, hematuria and urgency.    Past Medical History:  Past Medical History:  Diagnosis Date  . Arthritis   . Breast cancer ([place]) 1995   LT LUMPECTOMY & s/p XRT  . Depression   . Hx of colonic polyp   . Hypercholesterolemia   . Hypertension   . Melanoma ([place])   . Spinal stenosis    lumbar    Past Surgical History:  Past Surgical History:  Procedure Laterality Date  . BREAST BIOPSY Left 2006   EXCISIONAL - NEG  . BREAST BIOPSY Left 1995   LT LUMPECTOMY FOR CA  . BREAST LUMPECTOMY Left 1995  . EYE SURGERY Bilateral    cataract excision  . LAPAROSCOPIC APPENDECTOMY N/A 01/11/2016   Procedure: APPENDECTOMY LAPAROSCOPIC;  Surgeon: Dia Crawford III, MD;  Location: [facility] ORS;  Service: General;  Laterality: N/A;    Gynecologic History:  No LMP recorded. Patient is postmenopausal.  Family History:  Family History  Problem Relation Age of Onset  . Arthritis Mother   . Hyperlipidemia Mother   . Hypertension Mother   . Hyperlipidemia Sister   . Kidney cancer Sister   . Breast cancer Neg Hx     Social History:  Social History   Socioeconomic History  . Marital status: Widowed    Spouse name: Not on file  . Number of children:  2  . Years of education: Not on file  . Highest education level: Not on file  Occupational History  . Not on file  Social Needs  . Financial resource strain: Not on file  . Food insecurity:    Worry: Not on file    Inability: Not on file  . Transportation needs:    Medical: Not on file    Non-medical: Not on file  Tobacco Use  . Smoking status: Former Smoker    Packs/day: 0.50    Years: 4.00    Pack years: 2.00    Types: Cigarettes    Last attempt to quit: 12/09/1984    Years since quitting: 32.6  . Smokeless tobacco: Never Used  . Tobacco comment: quit 12/09/84  Substance and Sexual Activity  . Alcohol use: No    Alcohol/week: 0.0 oz    Comment: sparingly  . Drug use: No  . Sexual activity: Never  Lifestyle  . Physical activity:    Days per week: Not on file    Minutes per session: Not on file  . Stress: Not on file  Relationships  . Social connections:    Talks on phone: Not on file    Gets together: Not on file    Attends religious service: Not on file    Active member of club or organization: Not on file    Attends  meetings of clubs or organizations: Not on file    Relationship status: Not on file  . Intimate partner violence:    Fear of current or ex partner: Not on file    Emotionally abused: Not on file    Physically abused: Not on file    Forced sexual activity: Not on file  Other Topics Concern  . Not on file  Social History Narrative  . Not on file    Allergies:  Allergies  Allergen Reactions  . Contrast Media [Iodinated Diagnostic Agents] Hives    Medications: Prior to Admission medications   Medication Sig Start Date End Date Taking? Authorizing Provider  aspirin 81 MG tablet Take 81 mg by mouth daily.   Yes [provider]  Calcium Carb-Cholecalciferol (CALCIUM-VITAMIN D) 500-200 MG-UNIT tablet Take 2 tablets by mouth.    Yes [provider]  clonazePAM (KLONOPIN) 0.5 MG tablet Take 1 tablet (0.5 mg total) by mouth daily as  needed for anxiety. 06/17/17  Yes Einar Pheasant, MD  conjugated estrogens (PREMARIN) vaginal cream 1 gram vaginally nightly at bedtime for 2 weeks, 1 gram vaginally every other night at bedtime for 2 weeks, then 1 gram vaginally twice weekly 07/04/17  Yes Malachy Mood, MD  lisinopril-hydrochlorothiazide (PRINZIDE,ZESTORETIC) 20-12.5 MG tablet Take 1 tablet by mouth daily. 05/24/17  Yes Einar Pheasant, MD  lovastatin (MEVACOR) 40 MG tablet TAKE 1-2 TABLETS BY MOUTH DAILY 03/28/17  Yes Einar Pheasant, MD  mirtazapine (REMERON) 30 MG tablet TAKE 1 TABLET BY MOUTH AT BEDTIME 06/15/17  Yes Einar Pheasant, MD  Multiple Vitamin (MULTIVITAMIN) LIQD Take 5 mLs by mouth daily.   Yes [provider]  phenazopyridine (PYRIDIUM) 200 MG tablet Take 1 tablet (200 mg total) by mouth 3 (three) times daily as needed for pain. 06/22/17  Yes Einar Pheasant, MD  pregabalin (LYRICA) 50 MG capsule Take 50 mg by mouth 2 (two) times daily.    Yes [provider]    Physical Exam Vitals: Blood pressure 130/70, pulse 90, weight 136 lb (61.7 kg).  General: NAD HEENT: normocephalic, anicteric Pulmonary: No increased work of breathing Extremities: no edema, erythema, or tenderness Neurologic: Grossly intact, normal gait Psychiatric: mood appropriate, affect full  US Transvaginal Non-ob  Result Date: 07/11/2017 ULTRASOUND REPORT Patient Name: MARJORIE DEPREY DOB: 13-Jul-1930 MRN: [id number] Location: [facility] Date of Service: 07/11/2017 Indications:pelvic pressure Findings: The uterus is anteverted and measures 7.89 x 5.15 x 3.23cm. Echo texture is homogenous without evidence of focal masses. There appears to be fluid with echogenic debris in the cervix. The Endometrium measures 4.60 mm. Right Ovary is not visualized. Left Ovary is not visualized. Survey of the adnexa demonstrates no adnexal masses. There is no free fluid in the cul de sac. Impression: 1.fluid with echogenic debris in the cervix.  Recommendations: 1.Clinical correlation with the patient's History and Physical Exam. Edwena Bunde, RDMS, RVT Ultrasound today reveals some fluid in the cervical canal, given age likely secondary to cervical stenosis.  The endometrial stripe is just over 13mm but with no associated postmenopausal bleeding.  Ovaries are not imaged but there is no evidence of adnexal masses or pathology An endometrial measurement greater than 4 mm that is incidentally discovered in a postmenopausal patient without bleeding need not routinely trigger evaluation, although an individualized assessment based on patient characteristics and risk factors is appropriate. Thus, transvaginal ultrasonography is not an appropriate screening tool for endometrial cancer in postmenopausal women without bleeding.  ACOG Committee Opinion 734, May 2018 "The  Role of Transvaginal Ultrasonography in Evaluating the Endometrium of Women with Postmenopausal Bleeding" Malachy Mood, MD, [place] 07/11/2017, 2:10 PM    Assessment: 84 y.o. with pelvic pressure and bladder discomfort  Problem List Items Addressed This Visit    None    Visit Diagnoses    Pelvic pressure in female    -  Primary   Dysuria       Vaginal atrophy          1) Ultrasound today demonstrated no gyn pathology to explain the patient's symptoms of pressure.  On exam last visit no significant cystocele was noted that would benefit from pessary trial.  Ovaries image normally, no free fluid.  The endometrium measure slightly above 39mm but we discussed that the <47mm cut off to to rule out endometrial hyperplasia or malignancy was created for patient with postmenopausal bleeding and not upper limit has been established to screen asymptomatic postmenopausal women.  This finding therefore does not warrant further follow up. - at present continue premarin for vulvovaginal atrophy  2) If normal urology evaluation no improvement on  premarin consider trial of small pessary although no demonstrable prolapse noted at time of last visit I discussed with patient and family that it may be reasonable to try although she may actually note more pressure with a pessary in given lack of prolapse.    3) A total of 15 minutes were spent in face-to-face contact with the patient during this encounter with over half of that time devoted to counseling and coordination of care.    Malachy Mood, MD, [facility], [facility] 07/11/2017, 2:06 PM

## 2017-07-15 ENCOUNTER — Telehealth: Payer: Self-pay | Admitting: Radiology

## 2017-07-15 NOTE — Telephone Encounter (Signed)
Pt coming in for labs Monday, please place future orders. Thank you 

## 2017-07-15 NOTE — Telephone Encounter (Signed)
Per review of chart, the was to f/u on her tsh.  Her tsh was checked when she was here for work in appt, so she does not need to come in for lab check.  Please notify pt.  Thanks.

## 2017-07-15 NOTE — Telephone Encounter (Signed)
Patient aware and cancelled appt.

## 2017-07-18 ENCOUNTER — Other Ambulatory Visit: Payer: PPO

## 2017-07-21 ENCOUNTER — Ambulatory Visit: Payer: PPO | Admitting: Urology

## 2017-07-21 ENCOUNTER — Encounter: Payer: Self-pay | Admitting: Urology

## 2017-07-21 VITALS — BP 122/78 | HR 87 | Ht 63.0 in | Wt 135.6 lb

## 2017-07-21 DIAGNOSIS — R350 Frequency of micturition: Secondary | ICD-10-CM

## 2017-07-21 DIAGNOSIS — R300 Dysuria: Secondary | ICD-10-CM | POA: Diagnosis not present

## 2017-07-21 LAB — URINALYSIS, COMPLETE
BILIRUBIN UA: NEGATIVE
Glucose, UA: NEGATIVE
KETONES UA: NEGATIVE
LEUKOCYTES UA: NEGATIVE
NITRITE UA: NEGATIVE
Protein, UA: NEGATIVE
RBC UA: NEGATIVE
SPEC GRAV UA: 1.01 (ref 1.005–1.030)
Urobilinogen, Ur: 0.2 mg/dL (ref 0.2–1.0)
pH, UA: 7 (ref 5.0–7.5)

## 2017-07-21 LAB — MICROSCOPIC EXAMINATION
RBC MICROSCOPIC, UA: NONE SEEN /HPF (ref 0–2)
WBC, UA: NONE SEEN /hpf (ref 0–5)

## 2017-07-21 LAB — BLADDER SCAN AMB NON-IMAGING: Scan Result: 95

## 2017-07-21 MED ORDER — MIRABEGRON ER 25 MG PO TB24: 25.0000 mg | ORAL_TABLET | Freq: Every day | ORAL | 11 refills | Status: DC

## 2017-07-21 NOTE — Progress Notes (Signed)
07/21/2017 12:24 PM   Diana Lester Corky Sing March 08, 1931 [id number]  Referring provider: Einar Pheasant, MD [address] [place], [place] [address]  Chief Complaint  Patient presents with  . Dysuria    HPI: The patient is an 84 year old female with a past medical history of vaginal atrophy on Estrace cream who presents today for evaluation of urinary frequency and suprapubic pressure.  This is been going on for some time and is becoming increasingly bothersome.  She notes that she needs to urinate approximately 12 times per day (every 1-2 hours).  The volumes vary when she does urinate during the day.  This is impacting her quality of life.  She also gets bouts of urgency without incontinence.  She feels that she empties her bladder.  She only has nocturia 0-1 times per night.  She also notes intermittent dysuria with negative urine cultures in the past.  It is only mildly bothersome at this time.  She further notes feeling of suprapubic pressure in her bilateral lower quadrants.  Nothing makes his pressure better or worse.  The pressure does not seem to be related to voiding.  She has  had a full work-up for this by gynecology including pelvic ultrasound and exam which was negative except for vaginal atrophy.  Also of note, the patient has dementia, and her sister has to provide some of her history.   PMH: Past Medical History:  Diagnosis Date  . Arthritis   . Breast cancer ([place]) 1995   LT LUMPECTOMY & s/p XRT  . Depression   . Hx of colonic polyp   . Hypercholesterolemia   . Hypertension   . Melanoma ([place])   . Spinal stenosis    lumbar    Surgical History: Past Surgical History:  Procedure Laterality Date  . BREAST BIOPSY Left 2006   EXCISIONAL - NEG  . BREAST BIOPSY Left 1995   LT LUMPECTOMY FOR CA  . BREAST LUMPECTOMY Left 1995  . EYE SURGERY Bilateral    cataract excision  . LAPAROSCOPIC APPENDECTOMY N/A 01/11/2016   Procedure: APPENDECTOMY  LAPAROSCOPIC;  Surgeon: Dia Crawford III, MD;  Location: [facility] ORS;  Service: General;  Laterality: N/A;    Home Medications:  Allergies as of 07/21/2017      Reactions   Contrast Media [iodinated Diagnostic Agents] Hives      Medication List        Accurate as of 07/21/17 12:24 PM. Always use your most recent med list.          aspirin 81 MG tablet Take 81 mg by mouth daily.   calcium-vitamin D 500-200 MG-UNIT tablet Take 2 tablets by mouth.   clonazePAM 0.5 MG tablet Commonly known as:  KLONOPIN Take 1 tablet (0.5 mg total) by mouth daily as needed for anxiety.   conjugated estrogens vaginal cream Commonly known as:  PREMARIN 1 gram vaginally nightly at bedtime for 2 weeks, 1 gram vaginally every other night at bedtime for 2 weeks, then 1 gram vaginally twice weekly   lisinopril-hydrochlorothiazide 20-12.5 MG tablet Commonly known as:  PRINZIDE,ZESTORETIC Take 1 tablet by mouth daily.   lovastatin 40 MG tablet Commonly known as:  MEVACOR TAKE 1-2 TABLETS BY MOUTH DAILY   mirabegron ER 25 MG Tb24 tablet Commonly known as:  MYRBETRIQ Take 1 tablet (25 mg total) by mouth daily.   mirtazapine 30 MG tablet Commonly known as:  REMERON TAKE 1 TABLET BY MOUTH AT BEDTIME   multivitamin Liqd Take 5 mLs by  mouth daily.   phenazopyridine 200 MG tablet Commonly known as:  PYRIDIUM Take 1 tablet (200 mg total) by mouth 3 (three) times daily as needed for pain.   pregabalin 50 MG capsule Commonly known as:  LYRICA Take 50 mg by mouth 2 (two) times daily.       Allergies:  Allergies  Allergen Reactions  . Contrast Media [Iodinated Diagnostic Agents] Hives    Family History: Family History  Problem Relation Age of Onset  . Arthritis Mother   . Hyperlipidemia Mother   . Hypertension Mother   . Hyperlipidemia Sister   . Kidney cancer Sister   . Breast cancer Neg Hx     Social History:  reports that she quit smoking about 32 years ago. Her smoking use included  cigarettes. She has a 2.00 pack-year smoking history. She has never used smokeless tobacco. She reports that she does not drink alcohol or use drugs.  ROS: UROLOGY Frequent Urination?: Yes Hard to postpone urination?: Yes Burning/pain with urination?: Yes Get up at night to urinate?: Yes Leakage of urine?: No Urine stream starts and stops?: No Trouble starting stream?: No Do you have to strain to urinate?: No Blood in urine?: No Urinary tract infection?: No Sexually transmitted disease?: No Injury to kidneys or bladder?: No Painful intercourse?: No Weak stream?: No Currently pregnant?: No Vaginal bleeding?: No Last menstrual period?: n  Gastrointestinal Nausea?: No Vomiting?: No Indigestion/heartburn?: No Diarrhea?: No Constipation?: No  Constitutional Fever: No Night sweats?: No Weight loss?: No Fatigue?: Yes  Skin Skin rash/lesions?: No Itching?: No  Eyes Blurred vision?: No Double vision?: No  Ears/Nose/Throat Sore throat?: No Sinus problems?: No  Hematologic/Lymphatic Swollen glands?: No Easy bruising?: No  Cardiovascular Leg swelling?: No Chest pain?: No  Respiratory Cough?: No Shortness of breath?: No  Endocrine Excessive thirst?: No  Musculoskeletal Back pain?: Yes Joint pain?: No  Neurological Headaches?: No Dizziness?: No  Psychologic Depression?: Yes Anxiety?: Yes  Physical Exam: BP 122/78 (BP Location: Right Arm, Patient Position: Sitting, Cuff Size: Normal)   Pulse 87   Ht 5\' 3"  (1.6 m)   Wt 135 lb 9.6 oz (61.5 kg)   BMI 24.02 kg/m   Constitutional:  Alert and oriented, No acute distress. HEENT: [place] AT, moist mucus membranes.  Trachea midline, no masses. Cardiovascular: No clubbing, cyanosis, or edema. Respiratory: Normal respiratory effort, no increased work of breathing. GI: Abdomen is soft, nontender, nondistended, no abdominal masses GU: No CVA tenderness.  Skin: No rashes, bruises or suspicious lesions. Lymph: No  cervical or inguinal adenopathy. Neurologic: Grossly intact, no focal deficits, moving all 4 extremities. Psychiatric: Normal mood and affect.  Laboratory Data: Lab Results  Component Value Date   WBC 7.6 05/23/2017   HGB 15.5 (H) 06/14/2017   HCT 46.5 (H) 05/23/2017   MCV 88.4 05/23/2017   PLT 276.0 05/23/2017    Lab Results  Component Value Date   CREATININE 1.39 (H) 05/23/2017    No results found for: PSA  No results found for: TESTOSTERONE  No results found for: HGBA1C  Urinalysis    Component Value Date/Time   COLORURINE YELLOW (A) 07/01/2017 1522   APPEARANCEUR CLEAR (A) 07/01/2017 1522   LABSPEC 1.010 07/01/2017 1522   PHURINE 6.0 07/01/2017 1522   GLUCOSEU NEGATIVE 07/01/2017 1522   GLUCOSEU NEGATIVE 05/23/2017 0837   HGBUR NEGATIVE 07/01/2017 1522   BILIRUBINUR negative 07/04/2017 1003   KETONESUR NEGATIVE 07/01/2017 1522   PROTEINUR Trace 07/04/2017 1003   PROTEINUR NEGATIVE 07/01/2017 1522  UROBILINOGEN negative (A) 07/04/2017 1003   UROBILINOGEN 0.2 05/23/2017 0837   NITRITE Negative 07/04/2017 1003   NITRITE NEGATIVE 07/01/2017 1522   LEUKOCYTESUR Negative 07/04/2017 1003    Assessment & Plan:    1.  Urinary frequency We will start the patient on Myrbetriq 25 mg daily.  We will reassess her symptoms in approximately 3 months.  She is not a good candidate for anticholinergics due to her baseline dementia.  2.  Dysuria The patient is not terribly bothered by this currently.  I have recommended over-the-counter AZO if this becomes more of a bother.  Return in about 3 months (around 10/21/2017).  Nickie Retort, MD  [facility] [address] [place], [place] [address] [phone]

## 2017-07-25 ENCOUNTER — Encounter: Payer: PPO | Admitting: Internal Medicine

## 2017-07-25 ENCOUNTER — Encounter

## 2017-07-27 ENCOUNTER — Ambulatory Visit (INDEPENDENT_AMBULATORY_CARE_PROVIDER_SITE_OTHER): Payer: PPO | Admitting: Internal Medicine

## 2017-07-27 ENCOUNTER — Encounter: Payer: Self-pay | Admitting: Internal Medicine

## 2017-07-27 VITALS — BP 126/78 | HR 82 | Temp 98.3°F | Resp 16 | Wt 134.8 lb

## 2017-07-27 DIAGNOSIS — N183 Chronic kidney disease, stage 3 unspecified: Secondary | ICD-10-CM

## 2017-07-27 DIAGNOSIS — R350 Frequency of micturition: Secondary | ICD-10-CM | POA: Diagnosis not present

## 2017-07-27 DIAGNOSIS — F32A Depression, unspecified: Secondary | ICD-10-CM

## 2017-07-27 DIAGNOSIS — Z0000 Encounter for general adult medical examination without abnormal findings: Secondary | ICD-10-CM | POA: Diagnosis not present

## 2017-07-27 DIAGNOSIS — F419 Anxiety disorder, unspecified: Secondary | ICD-10-CM | POA: Diagnosis not present

## 2017-07-27 DIAGNOSIS — F329 Major depressive disorder, single episode, unspecified: Secondary | ICD-10-CM | POA: Diagnosis not present

## 2017-07-27 DIAGNOSIS — I10 Essential (primary) hypertension: Secondary | ICD-10-CM | POA: Diagnosis not present

## 2017-07-27 DIAGNOSIS — E785 Hyperlipidemia, unspecified: Secondary | ICD-10-CM

## 2017-07-27 NOTE — Progress Notes (Signed)
[name], female   DOB: 1930-10-31, 84 y.o.   MRN: [id number]   Subjective:    [name], female    DOB: 06-Jan-1931, 84 y.o.   MRN: [id number]  HPI  Patient with past history of CKD, hypertension, depression and hypertension.  She comes in today to follow up on these issues as well as for a complete physical exam.  She is accompanied by her son.  History obtained from both of them.  She is staying by herself.  Son reports she is able to do her ADLs without significant difficulty.  Tries to stay active.  No chest pain.  No sob. No acid reflux.  No abdominal pain.  Bowels moving.  Does report some vaginal burning and has had issues with increased urinary frequency.  Has now seen gyn and urology.  Placed on estrogen cream and myrbetriq.  Does report some improvement in the frequency of urination.  Still with some vaginal irritation, but she just started the medication.  Discussed her memory.  Saw neurology.   She desires not to return for f/u at this time.  Off tramadol.  Continues to take one clonazepam per day.  Overall feels things are stable.     Past Medical History:  Diagnosis Date  . Arthritis   . Breast cancer ([place]) 1995   LT LUMPECTOMY & s/p XRT  . Depression   . Hx of colonic polyp   . Hypercholesterolemia   . Hypertension   . Melanoma ([place])   . Spinal stenosis    lumbar   Past Surgical History:  Procedure Laterality Date  . BREAST BIOPSY Left 2006   EXCISIONAL - NEG  . BREAST BIOPSY Left 1995   LT LUMPECTOMY FOR CA  . BREAST LUMPECTOMY Left 1995  . EYE SURGERY Bilateral    cataract excision  . LAPAROSCOPIC APPENDECTOMY N/A 01/11/2016   Procedure: APPENDECTOMY LAPAROSCOPIC;  Surgeon: Dia Crawford III, MD;  Location: [facility] ORS;  Service: General;  Laterality: N/A;   Family History  Problem Relation Age of Onset  . Arthritis Mother   . Hyperlipidemia Mother   . Hypertension Mother   . Hyperlipidemia Sister   . Kidney cancer Sister   .  Breast cancer Neg Hx    Social History   Socioeconomic History  . Marital status: Widowed    Spouse name: Not on file  . Number of children: 2  . Years of education: Not on file  . Highest education level: Not on file  Occupational History  . Not on file  Social Needs  . Financial resource strain: Not on file  . Food insecurity:    Worry: Not on file    Inability: Not on file  . Transportation needs:    Medical: Not on file    Non-medical: Not on file  Tobacco Use  . Smoking status: Former Smoker    Packs/day: 0.50    Years: 4.00    Pack years: 2.00    Types: Cigarettes    Last attempt to quit: 12/09/1984    Years since quitting: 32.6  . Smokeless tobacco: Never Used  . Tobacco comment: quit 12/09/84  Substance and Sexual Activity  . Alcohol use: No    Alcohol/week: 0.0 oz    Comment: sparingly  . Drug use: No  . Sexual activity: Never  Lifestyle  . Physical activity:    Days per week: Not on file    Minutes per session: Not on file  .  Stress: Not on file  Relationships  . Social connections:    Talks on phone: Not on file    Gets together: Not on file    Attends religious service: Not on file    Active member of club or organization: Not on file    Attends meetings of clubs or organizations: Not on file    Relationship status: Not on file  Other Topics Concern  . Not on file  Social History Narrative  . Not on file    Outpatient Encounter Medications as of 07/27/2017  Medication Sig  . aspirin 81 MG tablet Take 81 mg by mouth daily.  . Calcium Carb-Cholecalciferol (CALCIUM-VITAMIN D) 500-200 MG-UNIT tablet Take 2 tablets by mouth.   . clonazePAM (KLONOPIN) 0.5 MG tablet Take 1 tablet (0.5 mg total) by mouth daily as needed for anxiety.  . conjugated estrogens (PREMARIN) vaginal cream 1 gram vaginally nightly at bedtime for 2 weeks, 1 gram vaginally every other night at bedtime for 2 weeks, then 1 gram vaginally twice weekly  . lisinopril-hydrochlorothiazide  (PRINZIDE,ZESTORETIC) 20-12.5 MG tablet Take 1 tablet by mouth daily.  Marland Kitchen lovastatin (MEVACOR) 40 MG tablet TAKE 1-2 TABLETS BY MOUTH DAILY  . mirabegron ER (MYRBETRIQ) 25 MG TB24 tablet Take 1 tablet (25 mg total) by mouth daily.  . mirtazapine (REMERON) 30 MG tablet TAKE 1 TABLET BY MOUTH AT BEDTIME  . Multiple Vitamin (MULTIVITAMIN) LIQD Take 5 mLs by mouth daily.  . phenazopyridine (PYRIDIUM) 200 MG tablet Take 1 tablet (200 mg total) by mouth 3 (three) times daily as needed for pain.  . pregabalin (LYRICA) 50 MG capsule Take 50 mg by mouth 2 (two) times daily.    No facility-administered encounter medications on file as of 07/27/2017.     Review of Systems  Constitutional: Negative for appetite change and unexpected weight change.  HENT: Negative for congestion and sinus pressure.   Eyes: Negative for pain and visual disturbance.  Respiratory: Negative for cough, chest tightness and shortness of breath.   Cardiovascular: Negative for chest pain, palpitations and leg swelling.  Gastrointestinal: Negative for abdominal pain, diarrhea, nausea and vomiting.  Genitourinary: Positive for frequency. Negative for difficulty urinating and dysuria.       Vaginal burning as outlined.    Musculoskeletal: Negative for joint swelling and myalgias.  Skin: Negative for color change and rash.  Neurological: Negative for dizziness, light-headedness and headaches.  Hematological: Negative for adenopathy. Does not bruise/bleed easily.  Psychiatric/Behavioral: Negative for agitation and dysphoric mood.       Objective:    Physical Exam  Constitutional: She is oriented to person, place, and time. She appears well-developed and well-nourished. No distress.  HENT:  Nose: Nose normal.  Mouth/Throat: Oropharynx is clear and moist.  Eyes: Right eye exhibits no discharge. Left eye exhibits no discharge. No scleral icterus.  Neck: Neck supple. No thyromegaly present.  Cardiovascular: Normal rate and  regular rhythm.  Pulmonary/Chest: Breath sounds normal. No accessory muscle usage. No tachypnea. No respiratory distress. She has no decreased breath sounds. She has no wheezes. She has no rhonchi. Right breast exhibits no inverted nipple, no mass, no nipple discharge and no tenderness (no axillary adenopathy). Left breast exhibits no inverted nipple, no mass, no nipple discharge and no tenderness (no axilarry adenopathy).  Abdominal: Soft. Bowel sounds are normal. There is no tenderness.  Musculoskeletal: She exhibits no edema or tenderness.  Lymphadenopathy:    She has no cervical adenopathy.  Neurological: She is alert and oriented  to person, place, and time.  Skin: No rash noted. No erythema.  Psychiatric: She has a normal mood and affect. Her behavior is normal.    BP 126/78 (BP Location: Right Arm, Patient Position: Sitting, Cuff Size: Normal)   Pulse 82   Temp 98.3 F (36.8 C) (Oral)   Resp 16   Wt 134 lb 12.8 oz (61.1 kg)   SpO2 95%   BMI 23.88 kg/m  Wt Readings from Last 3 Encounters:  07/27/17 134 lb 12.8 oz (61.1 kg)  07/21/17 135 lb 9.6 oz (61.5 kg)  07/11/17 136 lb (61.7 kg)     Lab Results  Component Value Date   WBC 7.6 05/23/2017   HGB 15.5 (H) 06/14/2017   HCT 46.5 (H) 05/23/2017   PLT 276.0 05/23/2017   GLUCOSE 132 (H) 05/23/2017   CHOL 224 (H) 05/23/2017   TRIG 167.0 (H) 05/23/2017   HDL 68.00 05/23/2017   LDLDIRECT 126.7 04/17/2013   LDLCALC 123 (H) 05/23/2017   ALT 12 05/23/2017   AST 19 05/23/2017   NA 141 05/23/2017   K 4.2 05/23/2017   CL 103 05/23/2017   CREATININE 1.39 (H) 05/23/2017   BUN 21 05/23/2017   CO2 27 05/23/2017   TSH 2.89 06/14/2017       Assessment & Plan:   Problem List Items Addressed This Visit    Anxiety    Has a history of increased anxiety.  On clonazepam.  Does well with this.  Given her bladder and vaginal issues currently, will hold on trying to taper clonazepam at this time.  Follow.        Chronic kidney  disease, stage III (moderate) (HCC)    Has been followed by nephrology previously.  Desires not to return.  Avoid antiinflammatories.  Follow metabolic panel.        Depression    On remeron.  Stable.  Follow.       Essential hypertension, benign    Blood pressure under good control.  Continue same medication regimen.  Follow pressures.  Follow metabolic panel.        Health care maintenance    Physical today 07/27/17.  Mammogram 12/17/16 - Birads I.  Colonoscopy 2013 - Dr [name].       Hyperlipidemia    On lovastatin.  Follow lipid panel and liver function tests.        Urinary frequency    Saw urology.  On myrbetriq.  Follow.  Has helped.            Einar Pheasant, MD

## 2017-07-27 NOTE — Assessment & Plan Note (Signed)
Physical today 07/27/17.  Mammogram 12/17/16 - Birads I.  Colonoscopy 2013 - Dr [name].

## 2017-07-30 ENCOUNTER — Encounter: Payer: Self-pay | Admitting: Internal Medicine

## 2017-07-30 NOTE — Assessment & Plan Note (Signed)
Blood pressure under good control.  Continue same medication regimen.  Follow pressures.  Follow metabolic panel.   

## 2017-07-30 NOTE — Assessment & Plan Note (Signed)
Has a history of increased anxiety.  On clonazepam.  Does well with this.  Given her bladder and vaginal issues currently, will hold on trying to taper clonazepam at this time.  Follow.

## 2017-07-30 NOTE — Assessment & Plan Note (Signed)
On remeron.  Stable.  Follow.

## 2017-07-30 NOTE — Assessment & Plan Note (Signed)
On lovastatin.  Follow lipid panel and liver function tests.   

## 2017-07-30 NOTE — Assessment & Plan Note (Signed)
Has been followed by nephrology previously.  Desires not to return.  Avoid antiinflammatories.  Follow metabolic panel.

## 2017-07-30 NOTE — Assessment & Plan Note (Signed)
Saw urology.  On myrbetriq.  Follow.  Has helped.

## 2017-08-02 ENCOUNTER — Ambulatory Visit: Payer: Self-pay | Admitting: Urology

## 2017-08-20 ENCOUNTER — Other Ambulatory Visit: Payer: Self-pay | Admitting: Internal Medicine

## 2017-09-13 ENCOUNTER — Other Ambulatory Visit: Payer: Self-pay

## 2017-09-13 NOTE — Telephone Encounter (Signed)
lov 07/27/17 Nov 11/02/17 Last filled 06/17/17

## 2017-09-14 MED ORDER — CLONAZEPAM 0.5 MG PO TABS: 0.5000 mg | ORAL_TABLET | Freq: Every day | ORAL | 0 refills | Status: DC | PRN

## 2017-09-16 ENCOUNTER — Telehealth: Payer: Self-pay | Admitting: Urology

## 2017-09-16 NOTE — Telephone Encounter (Signed)
Patient is calling and asking for a script for Myrbetriq 25mg  Diana Lester gave her samples and she has run out. Please call her back to let her know if this can be done.   Diana Lester

## 2017-09-16 NOTE — Telephone Encounter (Signed)
Called pt informed her that rx for medication is already at the pharmacy. Pt gave verbal understanding.

## 2017-09-17 ENCOUNTER — Other Ambulatory Visit: Payer: Self-pay | Admitting: Internal Medicine

## 2017-10-04 ENCOUNTER — Ambulatory Visit (INDEPENDENT_AMBULATORY_CARE_PROVIDER_SITE_OTHER): Payer: PPO

## 2017-10-04 ENCOUNTER — Other Ambulatory Visit: Payer: Self-pay | Admitting: Internal Medicine

## 2017-10-04 VITALS — BP 122/70 | HR 85 | Temp 98.7°F | Resp 15 | Ht 61.0 in | Wt 135.1 lb

## 2017-10-04 DIAGNOSIS — Z0000 Encounter for general adult medical examination without abnormal findings: Secondary | ICD-10-CM

## 2017-10-04 MED ORDER — LOVASTATIN 40 MG PO TABS: 40.0000 mg | ORAL_TABLET | Freq: Every day | ORAL | 0 refills | Status: DC

## 2017-10-04 NOTE — Progress Notes (Addendum)
Subjective:   Diana Lester is a 84 y.o. female who presents for Medicare Annual (Subsequent) preventive examination.  Review of Systems:  No ROS.  Medicare Wellness Visit. Additional risk factors are reflected in the social history. Cardiac Risk Factors include: advanced age (>68men, >14 women);hypertension     Objective:     Vitals: BP 122/70 (BP Location: Right Arm, Patient Position: Sitting, Cuff Size: Normal)   Pulse 85   Temp 98.7 F (37.1 C) (Oral)   Resp 15   Ht 5\' 1"  (1.549 m)   Wt 135 lb 1.9 oz (61.3 kg)   SpO2 94%   BMI 25.53 kg/m   Body mass index is 25.53 kg/m.  Advanced Directives 10/04/2017 06/20/2017 04/13/2017 03/10/2016 01/10/2016 01/10/2016 12/01/2015  Does Patient Have a Medical Advance Directive? Yes Yes No Yes No No No  Type of Paramedic of [place];Living will Healthcare Power of [place];Living will - - -  Does patient want to make changes to medical advance directive? No - Patient declined - - No - Patient declined - - -  Copy of [place] in Chart? No - copy requested - - No - copy requested - - -  Would patient like information on creating a medical advance directive? - - No - Patient declined - - No - patient declined information No - patient declined information    Tobacco Social History   Tobacco Use  Smoking Status Former Smoker  . Packs/day: 0.50  . Years: 4.00  . Pack years: 2.00  . Types: Cigarettes  . Last attempt to quit: 12/09/1984  . Years since quitting: 32.8  Smokeless Tobacco Never Used  Tobacco Comment   quit 12/09/84     Counseling given: Not Answered Comment: quit 12/09/84   Clinical Intake:  Pre-visit preparation completed: Yes  Pain : 0-10 Pain Score: 1  Pain Type: Chronic pain Pain Location: Back Pain Descriptors / Indicators: Tightness Pain Onset: More than a month ago Pain Relieving Factors: Injections.  Followed by Dr. Sharlet Salina.  Effect of  Pain on Daily Activities: Rests between activities  Pain Relieving Factors: Injections.  Followed by Dr. Sharlet Salina.   Nutritional Status: BMI 25 -29 Overweight Diabetes: No  How often do you need to have someone help you when you read instructions, pamphlets, or other written materials from your doctor or pharmacy?: 1 - Never  Interpreter Needed?: No     Past Medical History:  Diagnosis Date  . Arthritis   . Breast cancer ([place]) 1995   LT LUMPECTOMY & s/p XRT  . Depression   . Hx of colonic polyp   . Hypercholesterolemia   . Hypertension   . Melanoma ([place])   . Spinal stenosis    lumbar   Past Surgical History:  Procedure Laterality Date  . BREAST BIOPSY Left 2006   EXCISIONAL - NEG  . BREAST BIOPSY Left 1995   LT LUMPECTOMY FOR CA  . BREAST LUMPECTOMY Left 1995  . EYE SURGERY Bilateral    cataract excision  . LAPAROSCOPIC APPENDECTOMY N/A 01/11/2016   Procedure: APPENDECTOMY LAPAROSCOPIC;  Surgeon: Dia Crawford III, MD;  Location: [facility] ORS;  Service: General;  Laterality: N/A;   Family History  Problem Relation Age of Onset  . Arthritis Mother   . Hyperlipidemia Mother   . Hypertension Mother   . Hyperlipidemia Sister   . Kidney cancer Sister   . Breast cancer Neg Hx    Social History  Socioeconomic History  . Marital status: Widowed    Spouse name: Not on file  . Number of children: 2  . Years of education: Not on file  . Highest education level: Not on file  Occupational History  . Not on file  Social Needs  . Financial resource strain: Not on file  . Food insecurity:    Worry: Not on file    Inability: Not on file  . Transportation needs:    Medical: Not on file    Non-medical: Not on file  Tobacco Use  . Smoking status: Former Smoker    Packs/day: 0.50    Years: 4.00    Pack years: 2.00    Types: Cigarettes    Last attempt to quit: 12/09/1984    Years since quitting: 32.8  . Smokeless tobacco: Never Used  . Tobacco comment: quit 12/09/84    Substance and Sexual Activity  . Alcohol use: No    Alcohol/week: 0.0 oz    Comment: sparingly  . Drug use: No  . Sexual activity: Never  Lifestyle  . Physical activity:    Days per week: Not on file    Minutes per session: Not on file  . Stress: Not on file  Relationships  . Social connections:    Talks on phone: Not on file    Gets together: Not on file    Attends religious service: Not on file    Active member of club or organization: Not on file    Attends meetings of clubs or organizations: Not on file    Relationship status: Not on file  Other Topics Concern  . Not on file  Social History Narrative  . Not on file    Outpatient Encounter Medications as of 10/04/2017  Medication Sig  . aspirin 81 MG tablet Take 81 mg by mouth daily.  . Calcium Carb-Cholecalciferol (CALCIUM-VITAMIN D) 500-200 MG-UNIT tablet Take 2 tablets by mouth.   . clonazePAM (KLONOPIN) 0.5 MG tablet Take 1 tablet (0.5 mg total) by mouth daily as needed for anxiety.  Marland Kitchen lisinopril-hydrochlorothiazide (PRINZIDE,ZESTORETIC) 20-12.5 MG tablet TAKE 1 TABLET BY MOUTH DAILY  . lovastatin (MEVACOR) 40 MG tablet TAKE 1-2 TABLETS BY MOUTH DAILY  . mirabegron ER (MYRBETRIQ) 25 MG TB24 tablet Take 1 tablet (25 mg total) by mouth daily.  . mirtazapine (REMERON) 30 MG tablet TAKE 1 TABLET BY MOUTH AT BEDTIME  . pregabalin (LYRICA) 50 MG capsule Take 50 mg by mouth 2 (two) times daily.   . [DISCONTINUED] conjugated estrogens (PREMARIN) vaginal cream 1 gram vaginally nightly at bedtime for 2 weeks, 1 gram vaginally every other night at bedtime for 2 weeks, then 1 gram vaginally twice weekly  . [DISCONTINUED] Multiple Vitamin (MULTIVITAMIN) LIQD Take 5 mLs by mouth daily.  . [DISCONTINUED] phenazopyridine (PYRIDIUM) 200 MG tablet Take 1 tablet (200 mg total) by mouth 3 (three) times daily as needed for pain.   No facility-administered encounter medications on file as of 10/04/2017.     Activities of Daily Living In  your present state of health, do you have any difficulty performing the following activities: 10/04/2017  Hearing? N  Vision? N  Difficulty concentrating or making decisions? Y  Comment Difficulty with recall  Walking or climbing stairs? N  Dressing or bathing? N  Doing errands, shopping? N  Preparing Food and eating ? N  Using the Toilet? N  In the past six months, have you accidently leaked urine? N  Comment Followed by [facility] for  frequency. Taking Myrbetriq as directed.    Do you have problems with loss of bowel control? N  Managing your Medications? N  Managing your Finances? N  Housekeeping or managing your Housekeeping? N  Some recent data might be hidden    Patient Care Team: Einar Pheasant, MD as PCP - General (Internal Medicine)    Assessment:   This is a routine wellness examination for [place].  The goal of the wellness visit is to assist the patient how to close the gaps in care and create a preventative care plan for the patient.   The roster of all physicians providing medical care to patient is listed in the Snapshot section of the chart.  Taking calcium VIT D as appropriate/Osteoporosis risk reviewed.   Dexa scan discussed.   Safety issues reviewed; Lives alone. Smoke and carbon monoxide detectors in the home. No firearms in the home. Wears seatbelts when driving or riding with others. No violence in the home.  They do not have excessive sun exposure.  Discussed the need for sun protection: hats, long sleeves and the use of sunscreen if there is significant sun exposure.  Patient is alert, normal appearance, oriented to person/place/and time. Correctly identified the president of the [place] and recalls of 1/3 words.Performs simple calculations and can read correct time from watch face. She does repeat conversation dialogue within minutes and has memory concerns.  Appointment with pcp offered for further evaluation, declined.  Brain health foods and  exercises encouraged. Educational material provided.   No new identified risk were noted.  No failures at ADL's or IADL's.    BMI- discussed the importance of a healthy diet, water intake and the benefits of aerobic exercise. Educational material provided.   24 hour diet recall: Regular diet.   Dental- every 6 months.  Sleep patterns- Sleeps without issues. Nocturia x1. Taking myrbetriq as prescribed.   Health maintenance gaps- closed.  Patient Concerns: None at this time. Follow up with PCP as needed.  Exercise Activities and Dietary recommendations Current Exercise Habits: Home exercise routine, Type of exercise: walking, Time (Minutes): 10, Frequency (Times/Week): 1, Weekly Exercise (Minutes/Week): 10, Intensity: Mild  Goals    . Maintain Healthy Lifestyle      Walk for exercise as tolerated Stay hydrated Healthy diet       Fall Risk Fall Risk  10/04/2017 03/10/2016 12/22/2015 11/18/2015 03/11/2015  Falls in the past year? No No No No No   Depression Screen PHQ 2/9 Scores 10/04/2017 07/01/2016 03/10/2016 12/22/2015  PHQ - 2 Score 0 1 0 0  PHQ- 9 Score - 1 - -     Cognitive Function MMSE - Mini Mental State Exam 10/04/2017 03/10/2016 03/11/2015  Orientation to time 5 5 5   Orientation to Place 5 5 5   Registration 3 3 3   Attention/ Calculation 5 5 5   Recall 1 3 3   Language- name 2 objects 2 2 2   Language- repeat 1 1 1   Language- follow 3 step command 2 3 3   Language- read & follow direction 1 1 1   Write a sentence 1 1 1   Copy design 1 1 1   Total score 27 30 30         Immunization History  Administered Date(s) Administered  . DTaP 08/31/2011  . Influenza Split 12/11/2012, 12/13/2013  . Influenza, High Dose Seasonal PF 11/18/2015, 04/02/2017  . Influenza, Seasonal, Injecte, Preservative Fre 12/05/2007  . Influenza,inj,Quad PF,6+ Mos 11/06/2014  . Pneumococcal Conjugate-13 12/22/2015  . Pneumococcal Polysaccharide-23 05/23/2017  .  Tdap 08/31/2011  . Zoster  01/29/2011   Screening Tests Health Maintenance  Topic Date Due  . DEXA SCAN  10/05/2018 (Originally 01/31/1998)  . INFLUENZA VACCINE  10/06/2017  . MAMMOGRAM  12/17/2017  . TETANUS/TDAP  08/30/2021  . PNA vac Low Risk Adult  Completed      Plan:    End of life planning; Advance aging; Advanced directives discussed. Copy of current HCPOA/Living Will requested.    I have personally reviewed and noted the following in the patient's chart:   . Medical and social history . Use of alcohol, tobacco or illicit drugs  . Current medications and supplements . Functional ability and status . Nutritional status . Physical activity . Advanced directives . List of other physicians . Hospitalizations, surgeries, and ER visits in previous 12 months . Vitals . Screenings to include cognitive, depression, and falls . Referrals and appointments  In addition, I have reviewed and discussed with patient certain preventive protocols, quality metrics, and best practice recommendations. A written personalized care plan for preventive services as well as general preventive health recommendations were provided to patient.     Diana Lester, Diana Friedl L, LPN  [id number]   Reviewed above information.  Agree with assessment and plan.   Dr Nicki Reaper

## 2017-10-04 NOTE — Telephone Encounter (Signed)
Copied from [id number] [phone]. Topic: Quick Communication - Rx Refill/Question >> Oct 04, 2017  3:35 PM Bea Graff, NT wrote: Medication: lovastatin (MEVACOR) 40 MG tablet   Has the patient contacted their pharmacy? Yes.   (Agent: If no, request that the patient contact the pharmacy for the refill.) (Agent: If yes, when and what did the pharmacy advise?)  Preferred Pharmacy (with phone number or street name): [id number] [address], [place] - [address] [place] [phone] (Phone) [phone] (Fax)      Agent: Please be advised that RX refills may take up to 3 business days. We ask that you follow-up with your pharmacy.

## 2017-10-04 NOTE — Patient Instructions (Addendum)
  [name] , Thank you for taking time to come for your Medicare Wellness Visit. I appreciate your ongoing commitment to your health goals. Please review the following plan we discussed and let me know if I can assist you in the future.   These are the goals we discussed: Goals    . Maintain Healthy Lifestyle      Walk for exercise as tolerated Stay hydrated Healthy diet       This is a list of the screening recommended for you and due dates:  Health Maintenance  Topic Date Due  . DEXA scan (bone density measurement)  10/05/2018*  . Flu Shot  10/06/2017  . Mammogram  12/17/2017  . Tetanus Vaccine  08/30/2021  . Pneumonia vaccines  Completed  *Topic was postponed. The date shown is not the original due date.   Bone Densitometry Bone densitometry is an imaging test that uses a special X-ray to measure the amount of calcium and other minerals in your bones (bone density). This test is also known as a bone mineral density test or dual-energy X-ray absorptiometry (DXA). The test can measure bone density at your hip and your spine. It is similar to having a regular X-ray. You may have this test to:  Diagnose a condition that causes weak or thin bones (osteoporosis).  Predict your risk of a broken bone (fracture).  Determine how well osteoporosis treatment is working.  Tell a health care provider about:  Any allergies you have.  All medicines you are taking, including vitamins, herbs, eye drops, creams, and over-the-counter medicines.  Any problems you or family members have had with anesthetic medicines.  Any blood disorders you have.  Any surgeries you have had.  Any medical conditions you have.  Possibility of pregnancy.  Any other medical test you had within the previous 14 days that used contrast material. What are the risks? Generally, this is a safe procedure. However, problems can occur and may include the following:  This test exposes you to a very small amount  of radiation.  The risks of radiation exposure may be greater to unborn children.  What happens before the procedure?  Do not take any calcium supplements for 24 hours before having the test. You can otherwise eat and drink what you usually do.  Take off all metal jewelry, eyeglasses, dental appliances, and any other metal objects. What happens during the procedure?  You may lie on an exam table. There will be an X-ray generator below you and an imaging device above you.  Other devices, such as boxes or braces, may be used to position your body properly for the scan.  You will need to lie still while the machine slowly scans your body.  The images will show up on a computer monitor. What happens after the procedure? You may need more testing at a later time. This information is not intended to replace advice given to you by your health care provider. Make sure you discuss any questions you have with your health care provider. Document Released: 03/16/2004 Document Revised: 07/31/2015 Document Reviewed: 08/02/2013 Elsevier Interactive Patient Education  2018 [employer].

## 2017-10-11 ENCOUNTER — Ambulatory Visit: Payer: PPO | Admitting: Obstetrics and Gynecology

## 2017-10-21 ENCOUNTER — Ambulatory Visit (INDEPENDENT_AMBULATORY_CARE_PROVIDER_SITE_OTHER): Payer: PPO | Admitting: Urology

## 2017-10-21 ENCOUNTER — Encounter: Payer: Self-pay | Admitting: Urology

## 2017-10-21 ENCOUNTER — Other Ambulatory Visit: Payer: Self-pay

## 2017-10-21 VITALS — BP 120/71 | HR 91 | Ht 63.0 in | Wt 134.4 lb

## 2017-10-21 DIAGNOSIS — R300 Dysuria: Secondary | ICD-10-CM

## 2017-10-21 LAB — BLADDER SCAN AMB NON-IMAGING: Scan Result: 12

## 2017-10-21 MED ORDER — ESTROGENS, CONJUGATED 0.625 MG/GM VA CREA: TOPICAL_CREAM | VAGINAL | 12 refills | Status: DC

## 2017-10-21 MED ORDER — TROSPIUM CHLORIDE 20 MG PO TABS: 20.0000 mg | ORAL_TABLET | Freq: Two times a day (BID) | ORAL | 2 refills | Status: DC

## 2017-10-21 NOTE — Progress Notes (Signed)
10/21/2017 2:26 PM   Holley Raring Corky Sing 14-Apr-1930 [id number]  Reason for visit: Follow up dysuria, frequency  HPI: I am seeing Ms. Colon in urology clinic today for follow-up of dysuria and frequency.  On history of urinary frequency and dysuria with negative cultures.  There are no aggravating or alleviating factors.  Reports low pelvic pain.  Notably, she has dementia and is here today with her sister who helps with her history.  She was trialed on Myrbetriq after her last visit but noted no improvement.  She has since stopped this medication.  She was also previously prescribed Estrace topical vaginal cream for atrophy but has also stopped this.  She has a 2-pack-year smoking history and quit 30 years ago.  Her primary complaint is the dysuria, and the frequency does not bother her as much.  PVR in clinic today is 12 cc   ROS: Please see flowsheet from today's date for complete review of systems.  Physical Exam: BP 120/71   Pulse 91   Ht 5\' 3"  (1.6 m)   Wt 134 lb 6.4 oz (61 kg)   BMI 23.81 kg/m   Constitutional:  Alert and oriented, No acute distress. Respiratory: Normal respiratory effort, no increased work of breathing. GI: Abdomen is soft, nontender, nondistended, no abdominal masses Skin: No rashes, bruises or suspicious lesions. Neurologic: Grossly intact, no focal deficits, moving all 4 extremities. Psychiatric: Normal mood and affect  Laboratory Data: Lab Results  Component Value Date   WBC 7.6 05/23/2017   HGB 15.5 (H) 06/14/2017   HCT 46.5 (H) 05/23/2017   MCV 88.4 05/23/2017   PLT 276.0 05/23/2017    Lab Results  Component Value Date   CREATININE 1.39 (H) 05/23/2017    Urinalysis    Component Value Date/Time   COLORURINE YELLOW (A) 07/01/2017 1522   APPEARANCEUR Clear 07/21/2017 1129   LABSPEC 1.010 07/01/2017 1522   PHURINE 6.0 07/01/2017 1522   GLUCOSEU Negative 07/21/2017 1129   GLUCOSEU NEGATIVE 05/23/2017 0837   HGBUR NEGATIVE 07/01/2017  1522   BILIRUBINUR Negative 07/21/2017 1129   KETONESUR NEGATIVE 07/01/2017 1522   PROTEINUR Negative 07/21/2017 1129   PROTEINUR NEGATIVE 07/01/2017 1522   UROBILINOGEN negative (A) 07/04/2017 1003   UROBILINOGEN 0.2 05/23/2017 0837   NITRITE Negative 07/21/2017 1129   NITRITE NEGATIVE 07/01/2017 1522   LEUKOCYTESUR Negative 07/21/2017 1129    Lab Results  Component Value Date   LABMICR See below: 07/21/2017   WBCUA None seen 07/21/2017   RBCUA None seen 07/21/2017   LABEPIT 0-10 07/21/2017   BACTERIA Few (A) 07/21/2017    Pertinent Imaging: None to review  Assessment & Plan:   In summary, Ms. Scahill is an 84 year old female with dementia and chronic dysuria and frequency.  She had minimal improvement with Myrbetriq and has since discontinued this medication.  She does not have a significant smoking history, or microscopic hematuria making carcinoma in situ unlikely.  We will defer cystoscopy at this time.  1. Dysuria/frequency We discussed this is often complex and hard to diagnose the exact etiology.  Often patients with anxiety manifest with pelvic pain and bladder symptoms.  We will trial trospium for her frequency.  This does not cross the blood-brain barrier, and should be safe with her age and dementia.  I counseled her and her sister about dry mouth, constipation, and rarely worsening confusion.  She will also resume Estrace cream topically.  I also instructed her that she could use Pyridium temporarily over-the-counter if the  burning is severe.  Finally, if she continues to have significant pelvic pain over the next few months, could consider cystoscopy and/or referral to pelvic floor physical therapy.   Return in about 3 months (around 01/21/2018) for symptom check.  [place], [facility] [address], [place],  [address] [phone]

## 2017-10-21 NOTE — Patient Instructions (Signed)

## 2017-11-02 ENCOUNTER — Encounter: Payer: Self-pay | Admitting: Internal Medicine

## 2017-11-02 ENCOUNTER — Ambulatory Visit (INDEPENDENT_AMBULATORY_CARE_PROVIDER_SITE_OTHER): Payer: PPO | Admitting: Internal Medicine

## 2017-11-02 ENCOUNTER — Encounter: Payer: PPO | Admitting: Internal Medicine

## 2017-11-02 DIAGNOSIS — N183 Chronic kidney disease, stage 3 unspecified: Secondary | ICD-10-CM

## 2017-11-02 DIAGNOSIS — F329 Major depressive disorder, single episode, unspecified: Secondary | ICD-10-CM

## 2017-11-02 DIAGNOSIS — Z1231 Encounter for screening mammogram for malignant neoplasm of breast: Secondary | ICD-10-CM | POA: Diagnosis not present

## 2017-11-02 DIAGNOSIS — R350 Frequency of micturition: Secondary | ICD-10-CM | POA: Diagnosis not present

## 2017-11-02 DIAGNOSIS — E785 Hyperlipidemia, unspecified: Secondary | ICD-10-CM | POA: Diagnosis not present

## 2017-11-02 DIAGNOSIS — F32A Depression, unspecified: Secondary | ICD-10-CM

## 2017-11-02 DIAGNOSIS — I10 Essential (primary) hypertension: Secondary | ICD-10-CM | POA: Diagnosis not present

## 2017-11-02 DIAGNOSIS — F419 Anxiety disorder, unspecified: Secondary | ICD-10-CM | POA: Diagnosis not present

## 2017-11-02 DIAGNOSIS — Z1239 Encounter for other screening for malignant neoplasm of breast: Secondary | ICD-10-CM

## 2017-11-02 LAB — CBC WITH DIFFERENTIAL/PLATELET
BASOS PCT: 0.5 % (ref 0.0–3.0)
Basophils Absolute: 0 10*3/uL (ref 0.0–0.1)
EOS ABS: 0.2 10*3/uL (ref 0.0–0.7)
Eosinophils Relative: 1.9 % (ref 0.0–5.0)
HCT: 45.1 % (ref 36.0–46.0)
HEMOGLOBIN: 15.4 g/dL — AB (ref 12.0–15.0)
Lymphocytes Relative: 15.1 % (ref 12.0–46.0)
Lymphs Abs: 1.4 10*3/uL (ref 0.7–4.0)
MCHC: 34 g/dL (ref 30.0–36.0)
MCV: 89.1 fl (ref 78.0–100.0)
MONO ABS: 0.7 10*3/uL (ref 0.1–1.0)
Monocytes Relative: 7.9 % (ref 3.0–12.0)
NEUTROS ABS: 6.8 10*3/uL (ref 1.4–7.7)
NEUTROS PCT: 74.6 % (ref 43.0–77.0)
PLATELETS: 269 10*3/uL (ref 150.0–400.0)
RBC: 5.06 Mil/uL (ref 3.87–5.11)
RDW: 13.1 % (ref 11.5–15.5)
WBC: 9.2 10*3/uL (ref 4.0–10.5)

## 2017-11-02 LAB — BASIC METABOLIC PANEL
BUN: 25 mg/dL — ABNORMAL HIGH (ref 6–23)
CHLORIDE: 97 meq/L (ref 96–112)
CO2: 30 meq/L (ref 19–32)
Calcium: 10.7 mg/dL — ABNORMAL HIGH (ref 8.4–10.5)
Creatinine, Ser: 1.55 mg/dL — ABNORMAL HIGH (ref 0.40–1.20)
GFR: 33.8 mL/min — ABNORMAL LOW (ref >60.00)
GLUCOSE: 105 mg/dL — AB (ref 70–99)
POTASSIUM: 4.4 meq/L (ref 3.5–5.1)
Sodium: 137 mEq/L (ref 135–145)

## 2017-11-02 LAB — HEPATIC FUNCTION PANEL
ALT: 12 U/L (ref 0–35)
AST: 19 U/L (ref 0–37)
Albumin: 4.6 g/dL (ref 3.5–5.2)
Alkaline Phosphatase: 64 U/L (ref 39–117)
BILIRUBIN DIRECT: 0.1 mg/dL (ref 0.0–0.3)
BILIRUBIN TOTAL: 0.5 mg/dL (ref 0.2–1.2)
Total Protein: 7.2 g/dL (ref 6.0–8.3)

## 2017-11-02 NOTE — Progress Notes (Signed)
[name], female   DOB: 06/05/1930, 84 y.o.   MRN: [id number]   Subjective:    [name], female    DOB: 03-Apr-1930, 84 y.o.   MRN: [id number]  HPI  Patient here for a scheduled follow up.  She has been seeing urology for dysuria and urinary frequency.  Was initially placed on myrbetriq.  Did not help. Now on trospium.  Helping.  Doing better.  Discussed issues with her memory.  She writes down notes for reminders.  Has seen neurology previously.  Discussed referral back. Eating.  Tries to stay active.  No chest pain. No sob.  No acid reflux.  No abdominal pain.  Bowels moving.     Past Medical History:  Diagnosis Date  . Arthritis   . Breast cancer ([place]) 1995   LT LUMPECTOMY & s/p XRT  . Depression   . Hx of colonic polyp   . Hypercholesterolemia   . Hypertension   . Melanoma ([place])   . Spinal stenosis    lumbar   Past Surgical History:  Procedure Laterality Date  . BREAST BIOPSY Left 2006   EXCISIONAL - NEG  . BREAST BIOPSY Left 1995   LT LUMPECTOMY FOR CA  . BREAST LUMPECTOMY Left 1995  . EYE SURGERY Bilateral    cataract excision  . LAPAROSCOPIC APPENDECTOMY N/A 01/11/2016   Procedure: APPENDECTOMY LAPAROSCOPIC;  Surgeon: Dia Crawford III, MD;  Location: [facility] ORS;  Service: General;  Laterality: N/A;   Family History  Problem Relation Age of Onset  . Arthritis Mother   . Hyperlipidemia Mother   . Hypertension Mother   . Hyperlipidemia Sister   . Kidney cancer Sister   . Breast cancer Neg Hx    Social History   Socioeconomic History  . Marital status: Widowed    Spouse name: Not on file  . Number of children: 2  . Years of education: Not on file  . Highest education level: Not on file  Occupational History  . Not on file  Social Needs  . Financial resource strain: Not on file  . Food insecurity:    Worry: Not on file    Inability: Not on file  . Transportation needs:    Medical: Not on file    Non-medical: Not on file    Tobacco Use  . Smoking status: Former Smoker    Packs/day: 0.50    Years: 4.00    Pack years: 2.00    Types: Cigarettes    Last attempt to quit: 12/09/1984    Years since quitting: 32.9  . Smokeless tobacco: Never Used  . Tobacco comment: quit 12/09/84  Substance and Sexual Activity  . Alcohol use: No    Alcohol/week: 0.0 standard drinks    Comment: sparingly  . Drug use: No  . Sexual activity: Never  Lifestyle  . Physical activity:    Days per week: Not on file    Minutes per session: Not on file  . Stress: Not on file  Relationships  . Social connections:    Talks on phone: Not on file    Gets together: Not on file    Attends religious service: Not on file    Active member of club or organization: Not on file    Attends meetings of clubs or organizations: Not on file    Relationship status: Not on file  Other Topics Concern  . Not on file  Social History Narrative  . Not on  file    Outpatient Encounter Medications as of 11/02/2017  Medication Sig  . aspirin 81 MG tablet Take 81 mg by mouth daily.  . Calcium Carb-Cholecalciferol (CALCIUM-VITAMIN D) 500-200 MG-UNIT tablet Take 2 tablets by mouth.   . clonazePAM (KLONOPIN) 0.5 MG tablet Take 1 tablet (0.5 mg total) by mouth daily as needed for anxiety.  . conjugated estrogens (PREMARIN) vaginal cream Apply 1 pea sized amount three times weekly  . lisinopril-hydrochlorothiazide (PRINZIDE,ZESTORETIC) 20-12.5 MG tablet TAKE 1 TABLET BY MOUTH DAILY  . lovastatin (MEVACOR) 40 MG tablet Take 1-2 tablets (40-80 mg total) by mouth daily.  . mirabegron ER (MYRBETRIQ) 25 MG TB24 tablet Take 1 tablet (25 mg total) by mouth daily.  . mirtazapine (REMERON) 30 MG tablet TAKE 1 TABLET BY MOUTH AT BEDTIME  . pregabalin (LYRICA) 50 MG capsule Take 50 mg by mouth 2 (two) times daily.   . trospium (SANCTURA) 20 MG tablet Take 1 tablet (20 mg total) by mouth 2 (two) times daily.   No facility-administered encounter medications on file as of  11/02/2017.     Review of Systems  Constitutional: Negative for appetite change and unexpected weight change.  HENT: Negative for congestion and sinus pressure.   Respiratory: Negative for cough, chest tightness and shortness of breath.   Cardiovascular: Negative for chest pain, palpitations and leg swelling.  Gastrointestinal: Negative for abdominal pain, diarrhea, nausea and vomiting.  Genitourinary: Negative for difficulty urinating and dysuria.  Musculoskeletal: Negative for joint swelling and myalgias.  Skin: Negative for color change and rash.  Neurological: Negative for dizziness, light-headedness and headaches.  Psychiatric/Behavioral: Negative for agitation and dysphoric mood.       Objective:    Physical Exam  Constitutional: She appears well-developed and well-nourished. No distress.  HENT:  Nose: Nose normal.  Mouth/Throat: Oropharynx is clear and moist.  Neck: Neck supple. No thyromegaly present.  Cardiovascular: Normal rate and regular rhythm.  Pulmonary/Chest: Breath sounds normal. No respiratory distress. She has no wheezes.  Abdominal: Soft. Bowel sounds are normal. There is no tenderness.  Musculoskeletal: She exhibits no edema or tenderness.  Lymphadenopathy:    She has no cervical adenopathy.  Skin: No rash noted. No erythema.  Psychiatric: She has a normal mood and affect. Her behavior is normal.    BP 110/68 (BP Location: Right Arm, Patient Position: Sitting, Cuff Size: Normal)   Pulse 87   Temp 98.1 F (36.7 C) (Oral)   Resp 18   Wt 137 lb 3.2 oz (62.2 kg)   SpO2 96%   BMI 24.30 kg/m  Wt Readings from Last 3 Encounters:  11/02/17 137 lb 3.2 oz (62.2 kg)  10/21/17 134 lb 6.4 oz (61 kg)  10/04/17 135 lb 1.9 oz (61.3 kg)     Lab Results  Component Value Date   WBC 9.2 11/02/2017   HGB 15.4 (H) 11/02/2017   HCT 45.1 11/02/2017   PLT 269.0 11/02/2017   GLUCOSE 105 (H) 11/02/2017   CHOL 224 (H) 05/23/2017   TRIG 167.0 (H) 05/23/2017   HDL  68.00 05/23/2017   LDLDIRECT 126.7 04/17/2013   LDLCALC 123 (H) 05/23/2017   ALT 12 11/02/2017   AST 19 11/02/2017   NA 137 11/02/2017   K 4.4 11/02/2017   CL 97 11/02/2017   CREATININE 1.55 (H) 11/02/2017   BUN 25 (H) 11/02/2017   CO2 30 11/02/2017   TSH 2.89 06/14/2017       Assessment & Plan:   Problem List Items Addressed This  Visit    Anxiety    Has a history of increased anxiety.  On clonazepam. Takes one per day.  Feels like she does well on this dose of medication.  Follow.  Overall feels stable.        Chronic kidney disease, stage III (moderate) ([place])    Has seen nephrology.  Avoid antiinflammatories.  Follow metabolic panel.        Depression    On remeron.  Stable.        Essential hypertension, benign    Blood pressure under good control.  Continue same medication regimen.  Follow pressures.  Follow metabolic panel.        Relevant Orders   CBC with Differential/Platelet (Completed)   Basic metabolic panel (Completed)   Hyperlipidemia    On lovastatin.  Low cholesterol diet and exercise.  Follow lipid panel and liver function tests.        Relevant Orders   Hepatic function panel (Completed)   Urinary frequency    Seeing urology.  On trospium.  Helping.  Follow.         Other Visit Diagnoses    Breast cancer screening       Relevant Orders   MM 3D SCREEN BREAST BILATERAL       Einar Pheasant, MD

## 2017-11-03 ENCOUNTER — Other Ambulatory Visit: Payer: Self-pay | Admitting: Internal Medicine

## 2017-11-03 DIAGNOSIS — N183 Chronic kidney disease, stage 3 unspecified: Secondary | ICD-10-CM

## 2017-11-03 NOTE — Progress Notes (Signed)
Order placed for f/u labs.  

## 2017-11-04 ENCOUNTER — Telehealth: Payer: Self-pay | Admitting: Internal Medicine

## 2017-11-04 NOTE — Telephone Encounter (Signed)
Copied from [place] [id number]. Topic: General - Other >> Nov 04, 2017  8:33 AM Yvette Rack wrote: Reason for CRM: pt calling to speak with someone about the lab appt that's on 11-24-17 she would like to discuss her kidney test

## 2017-11-07 ENCOUNTER — Encounter: Payer: Self-pay | Admitting: Internal Medicine

## 2017-11-07 NOTE — Assessment & Plan Note (Signed)
On lovastatin.  Low cholesterol diet and exercise.  Follow lipid panel and liver function tests.   

## 2017-11-07 NOTE — Assessment & Plan Note (Signed)
On remeron.  Stable.

## 2017-11-07 NOTE — Assessment & Plan Note (Signed)
Has a history of increased anxiety.  On clonazepam. Takes one per day.  Feels like she does well on this dose of medication.  Follow.  Overall feels stable.

## 2017-11-07 NOTE — Assessment & Plan Note (Signed)
Has seen nephrology.  Avoid antiinflammatories.  Follow metabolic panel.

## 2017-11-07 NOTE — Assessment & Plan Note (Signed)
Seeing urology.  On trospium.  Helping.  Follow.

## 2017-11-07 NOTE — Assessment & Plan Note (Signed)
Blood pressure under good control.  Continue same medication regimen.  Follow pressures.  Follow metabolic panel.   

## 2017-11-08 NOTE — Telephone Encounter (Signed)
Patient needed to know what she was having done and why she was having it done. Reminded patient she was having a kidney function and calcium recheck

## 2017-11-19 ENCOUNTER — Other Ambulatory Visit: Payer: Self-pay | Admitting: Internal Medicine

## 2017-11-21 ENCOUNTER — Telehealth: Payer: Self-pay | Admitting: Urology

## 2017-11-21 NOTE — Telephone Encounter (Signed)
Spoke with patient, she was asked if she was using her premarin cream and she states that she is. Patient states that the burning is inside her vagina and she is having severe discomfort. She was instructed to follow up with GYN or PCP for further evaluation. Patient verbalized understanding

## 2017-11-21 NOTE — Telephone Encounter (Signed)
Pt called office stating that she has finished the medication prescribed to her at her last visit with Dr. Diamantina Providence, pt unable to recall medication, pt states it did not help her as she is still having burning in her vaginal area, Pt asks what should she do next. Please advise pt at [phone]. Thank you.

## 2017-11-22 ENCOUNTER — Encounter: Payer: Self-pay | Admitting: Family Medicine

## 2017-11-22 ENCOUNTER — Ambulatory Visit (INDEPENDENT_AMBULATORY_CARE_PROVIDER_SITE_OTHER): Payer: PPO | Admitting: Family Medicine

## 2017-11-22 VITALS — BP 120/76 | HR 92 | Temp 99.2°F | Ht 64.0 in | Wt 135.6 lb

## 2017-11-22 DIAGNOSIS — N183 Chronic kidney disease, stage 3 unspecified: Secondary | ICD-10-CM

## 2017-11-22 DIAGNOSIS — N949 Unspecified condition associated with female genital organs and menstrual cycle: Secondary | ICD-10-CM

## 2017-11-22 LAB — POCT URINALYSIS DIPSTICK
BILIRUBIN UA: NEGATIVE
Glucose, UA: NEGATIVE
KETONES UA: NEGATIVE
Leukocytes, UA: NEGATIVE
Nitrite, UA: NEGATIVE
PH UA: 6 (ref 5.0–8.0)
Protein, UA: NEGATIVE
RBC UA: NEGATIVE
SPEC GRAV UA: 1.025 (ref 1.010–1.025)
UROBILINOGEN UA: 0.2 U/dL

## 2017-11-22 NOTE — Addendum Note (Signed)
Addended by: Arby Barrette on: 11/22/2017 01:40 PM   Modules accepted: Orders

## 2017-11-22 NOTE — Progress Notes (Signed)
Subjective:    [name], female    DOB: 1930-07-09, 84 y.o.   MRN: [id number]  HPI  Patient presents to clinic complaining of feelings of vaginal burning.  States it does not burn when she urinates, "just feels hot down there".   Denies any fever or chills.  Denies abdominal pain, nausea, vomiting, diarrhea.  States her appetite has been normal.  Bowel movements have been normal.  Patient is not sexually active.  Patient also has upcoming lab appointment to check calcium level and kidney function due to history of hypercalcemia and chronic kidney disease stage III.  Patient would like to get the lab work done today so she does not have to return to clinic in a few days just for lab work.   Patient Active Problem List   Diagnosis Date Noted  . Urinary frequency 06/25/2017  . Lumbar spinal stenosis 01/23/2016  . Hypertension 01/23/2016  . History of depression 01/23/2016  . 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 Right lower quadrant abdominal pain   . Anxiety 10/20/2015  . Loss of weight 10/20/2015  . Hyponatremia 08/07/2014  . Health care maintenance 05/05/2014  . Skin cancer 01/29/2014  . Other malaise and fatigue 10/21/2013  . Skin lesion of cheek 10/21/2013  . Renal cyst 03/22/2013  . Chronic kidney disease, stage III (moderate) ([place]) 03/14/2013  . Abdominal bruit 03/14/2013  . Spinal stenosis of lumbar region 12/09/2012  . Bulging disc 12/09/2012  . History of colonic polyps 12/09/2012  . History of breast cancer 12/09/2012  . Depression 12/09/2012  . Essential hypertension, benign 12/09/2012  . Hyperlipidemia 12/09/2012   Social History   Tobacco Use  . Smoking status: Former Smoker    Packs/day: 0.50    Years: 4.00    Pack years: 2.00    Types: Cigarettes    Last attempt to quit: 12/09/1984    Years since quitting: 32.9  . Smokeless tobacco: Never Used  . Tobacco comment: quit 12/09/84  Substance Use Topics  . Alcohol use: No   Alcohol/week: 0.0 standard drinks    Comment: sparingly   Review of Systems  Constitutional: Negative for chills, fatigue and fever.  HENT: Negative for congestion, ear pain, sinus pain and sore throat.   Eyes: Negative.   Respiratory: Negative for cough, shortness of breath and wheezing.   Cardiovascular: Negative for chest pain, palpitations and leg swelling.  Gastrointestinal: Negative for abdominal pain, diarrhea, nausea and vomiting.  Genitourinary: "feels hot is vaginal area". No increased urinary urgency or frequency.  Musculoskeletal: Negative for arthralgias and myalgias.  Skin: Negative for color change, pallor and rash.  Neurological: Negative for syncope, light-headedness and headaches.  Psychiatric/Behavioral: The patient is not nervous/anxious.       Objective:   Physical Exam  Constitutional: She is oriented to person, place, and time. She appears well-nourished. No distress.  HENT:  Head: Normocephalic and atraumatic.  Eyes: EOM are normal. No scleral icterus.  Cardiovascular: Normal rate and regular rhythm.  Pulmonary/Chest: Effort normal and breath sounds normal. No respiratory distress.  Abdominal: Soft. Bowel sounds are normal. She exhibits no distension and no mass. There is no tenderness. There is no guarding.  Genitourinary: Pelvic exam was performed with patient supine. There is no tenderness, lesion or injury on the right labia. There is no tenderness, lesion or injury on the left labia. No erythema, tenderness or bleeding in the vagina. No vaginal discharge found.  Genitourinary Comments: Skin of vulva appears very  faintly red, almost like a heat rash or diaper rash. No vaginal discharge. No lesions.   Musculoskeletal: She exhibits no edema.  Neurological: She is alert and oriented to person, place, and time.  Gait normal  Skin: Skin is warm and dry. No pallor.  Psychiatric: She has a normal mood and affect.  Nursing note and vitals reviewed.     Vitals:    11/22/17 1311  BP: 120/76  Pulse: 92  Temp: 99.2 F (37.3 C)  SpO2: 94%    Assessment & Plan:   Vaginal Burning --- urinalysis is clear.  We will also send urine culture to be sure no UTI is present.  No obvious cause for burning sensation seen on exam, advised patient to wear nightgown and no underwear to bed to allow skin some time to air out overnight.  Sometimes with wearing underwear, pants, it is many layers and our skin becomes sweaty.  The skin of vulva only appears very faintly red similar to a heat rash or diaper rash.  Hypercalcemia- patient will get calcium level drawn today  Chronic kidney disease- patient will get basic metabolic panel today for monitoring  Keep regularly scheduled follow-up as planned.  Return to clinic sooner if issues persist or worsen.

## 2017-11-22 NOTE — Patient Instructions (Signed)
Increase water intake, decrease caffeine intake  Do not wear underwear to bed to see if this helps calm down burning feeling of skin

## 2017-11-23 ENCOUNTER — Encounter: Payer: Self-pay | Admitting: Family Medicine

## 2017-11-23 LAB — URINE CULTURE
MICRO NUMBER:: 91114155
SPECIMEN QUALITY:: ADEQUATE

## 2017-11-23 LAB — CALCIUM, IONIZED: Calcium, Ion: 5.43 mg/dL (ref 4.8–5.6)

## 2017-11-23 LAB — BASIC METABOLIC PANEL
BUN/Creatinine Ratio: 15 (calc) (ref 6–22)
BUN: 22 mg/dL (ref 7–25)
CO2: 26 mmol/L (ref 20–32)
Calcium: 10.6 mg/dL — ABNORMAL HIGH (ref 8.6–10.4)
Chloride: 98 mmol/L (ref 98–110)
Creat: 1.46 mg/dL — ABNORMAL HIGH (ref 0.60–0.88)
GLUCOSE: 115 mg/dL — AB (ref 65–99)
POTASSIUM: 4.4 mmol/L (ref 3.5–5.3)
SODIUM: 136 mmol/L (ref 135–146)

## 2017-11-24 ENCOUNTER — Other Ambulatory Visit: Payer: PPO

## 2017-12-12 ENCOUNTER — Other Ambulatory Visit: Payer: Self-pay | Admitting: Internal Medicine

## 2017-12-12 NOTE — Telephone Encounter (Signed)
Requested medication (s) are due for refill today: yes  Requested medication (s) are on the active medication list: yes  Last refill:  09/14/17 #90 0 refills  Future visit scheduled: yes 11/07/18      Requested Prescriptions  Pending Prescriptions Disp Refills   clonazePAM (KLONOPIN) 0.5 MG tablet 90 tablet 0    Sig: Take 1 tablet (0.5 mg total) by mouth daily as needed for anxiety.     Not Delegated - Psychiatry:  Anxiolytics/Hypnotics Failed - 12/12/2017  1:38 PM      Failed - This refill cannot be delegated      Failed - Urine Drug Screen completed in last 360 days.      Passed - Valid encounter within last 6 months    Recent Outpatient Visits          2 weeks ago Vaginal burning   [place], Jacquelynn Cree, FNP   1 month ago Breast cancer screening   [place], Randell Patient, MD   4 months ago Routine general medical examination at a health care facility   [place], MD   5 months ago Dysuria   [facility] [place], Randell Patient, MD   6 months ago Urine frequency   [place], MD      Future Appointments            In 2 months Einar Pheasant, MD [place], [place]   In 11 months [place], Bryson Corona, LPN [place], [place]   In 11 months Einar Pheasant, MD [facility], [place]

## 2017-12-12 NOTE — Telephone Encounter (Unsigned)
Copied from [place] [id number]. Topic: Quick Communication - See Telephone Encounter >> Dec 12, 2017 10:58 AM Hewitt Shorts wrote: Pt is needing a refil on clonazepam   [facility] [address]  [place] [phone]

## 2017-12-13 NOTE — Telephone Encounter (Signed)
Pt calling back about RX clonazePAM (KLONOPIN) 0.5 MG tablet stating that its not at the pharmacy call pt at [phone]

## 2017-12-13 NOTE — Telephone Encounter (Signed)
Pt calling to f/u on request - she states pharmacy contacted office 10/2 and has not received responses so yesterday she was advised to call - she has been out of her medication since yesterday - please advise.  [facility] [place], [place]

## 2017-12-14 ENCOUNTER — Other Ambulatory Visit: Payer: Self-pay | Admitting: *Deleted

## 2017-12-14 MED ORDER — CLONAZEPAM 0.5 MG PO TABS: 0.5000 mg | ORAL_TABLET | Freq: Every day | ORAL | 0 refills | Status: DC | PRN

## 2017-12-14 NOTE — Telephone Encounter (Addendum)
Pt following up on this request for the med.  Pt states she called on 10/02 and still has not gotten. Pt states her nerves are bad and she really needs this med asap.  [facility] [place], [place] [facility] [address] Shari Prows [phone] (Phone) [phone] (Fax)

## 2017-12-14 NOTE — Telephone Encounter (Signed)
rx ok'd for clonazepam #90 with no refills.  Sent in rx already.

## 2017-12-14 NOTE — Telephone Encounter (Signed)
Okay to refill? Pt also called this morning to check the status. Refill request was received on 12/12/17 via fax.

## 2017-12-14 NOTE — Telephone Encounter (Signed)
Patient is calling for update on prescription refill she is requesting a callback.

## 2017-12-19 ENCOUNTER — Ambulatory Visit
Admission: RE | Admit: 2017-12-19 | Discharge: 2017-12-19 | Disposition: A | Discharge status: Home / Self Care | Payer: PPO | Source: Ambulatory Visit | Attending: Internal Medicine | Admitting: Internal Medicine

## 2017-12-19 DIAGNOSIS — Z1231 Encounter for screening mammogram for malignant neoplasm of breast: Secondary | ICD-10-CM | POA: Insufficient documentation

## 2017-12-19 DIAGNOSIS — Z1239 Encounter for other screening for malignant neoplasm of breast: Secondary | ICD-10-CM

## 2017-12-19 HISTORY — DX: Personal history of irradiation: Z92.3

## 2017-12-24 ENCOUNTER — Other Ambulatory Visit: Payer: Self-pay | Admitting: Internal Medicine

## 2017-12-30 DIAGNOSIS — M47816 Spondylosis without myelopathy or radiculopathy, lumbar region: Secondary | ICD-10-CM | POA: Diagnosis not present

## 2018-01-09 DIAGNOSIS — M5416 Radiculopathy, lumbar region: Secondary | ICD-10-CM | POA: Diagnosis not present

## 2018-01-09 DIAGNOSIS — M5136 Other intervertebral disc degeneration, lumbar region: Secondary | ICD-10-CM | POA: Diagnosis not present

## 2018-01-09 DIAGNOSIS — M48062 Spinal stenosis, lumbar region with neurogenic claudication: Secondary | ICD-10-CM | POA: Diagnosis not present

## 2018-01-09 DIAGNOSIS — M47816 Spondylosis without myelopathy or radiculopathy, lumbar region: Secondary | ICD-10-CM | POA: Diagnosis not present

## 2018-01-23 ENCOUNTER — Ambulatory Visit: Payer: PPO | Admitting: Urology

## 2018-01-23 DIAGNOSIS — M47816 Spondylosis without myelopathy or radiculopathy, lumbar region: Secondary | ICD-10-CM | POA: Diagnosis not present

## 2018-01-23 DIAGNOSIS — M48062 Spinal stenosis, lumbar region with neurogenic claudication: Secondary | ICD-10-CM | POA: Diagnosis not present

## 2018-01-23 DIAGNOSIS — M5416 Radiculopathy, lumbar region: Secondary | ICD-10-CM | POA: Diagnosis not present

## 2018-01-23 DIAGNOSIS — M5136 Other intervertebral disc degeneration, lumbar region: Secondary | ICD-10-CM | POA: Diagnosis not present

## 2018-01-27 ENCOUNTER — Emergency Department
Admission: EM | Admit: 2018-01-27 | Discharge: 2018-01-27 | Disposition: A | Discharge status: Home / Self Care | Payer: PPO | Attending: Emergency Medicine | Admitting: Emergency Medicine

## 2018-01-27 ENCOUNTER — Ambulatory Visit: Payer: PPO | Admitting: Family Medicine

## 2018-01-27 ENCOUNTER — Other Ambulatory Visit: Payer: Self-pay

## 2018-01-27 ENCOUNTER — Encounter: Payer: Self-pay | Admitting: Emergency Medicine

## 2018-01-27 ENCOUNTER — Emergency Department: Payer: PPO

## 2018-01-27 DIAGNOSIS — M1711 Unilateral primary osteoarthritis, right knee: Secondary | ICD-10-CM | POA: Diagnosis not present

## 2018-01-27 DIAGNOSIS — Z87891 Personal history of nicotine dependence: Secondary | ICD-10-CM | POA: Diagnosis not present

## 2018-01-27 DIAGNOSIS — M25561 Pain in right knee: Secondary | ICD-10-CM | POA: Diagnosis not present

## 2018-01-27 DIAGNOSIS — Z79899 Other long term (current) drug therapy: Secondary | ICD-10-CM | POA: Insufficient documentation

## 2018-01-27 DIAGNOSIS — I129 Hypertensive chronic kidney disease with stage 1 through stage 4 chronic kidney disease, or unspecified chronic kidney disease: Secondary | ICD-10-CM | POA: Insufficient documentation

## 2018-01-27 DIAGNOSIS — N183 Chronic kidney disease, stage 3 (moderate): Secondary | ICD-10-CM | POA: Diagnosis not present

## 2018-01-27 DIAGNOSIS — Z7982 Long term (current) use of aspirin: Secondary | ICD-10-CM | POA: Insufficient documentation

## 2018-01-27 DIAGNOSIS — S8991XA Unspecified injury of right lower leg, initial encounter: Secondary | ICD-10-CM | POA: Diagnosis not present

## 2018-01-27 MED ORDER — LIDOCAINE 1.8 % EX PTCH: 1.0000 | MEDICATED_PATCH | Freq: Every day | CUTANEOUS | 0 refills | Status: DC

## 2018-01-27 MED ORDER — LIDOCAINE 5 % EX PTCH
1.0000 | MEDICATED_PATCH | CUTANEOUS | Status: DC
  Administered 2018-01-27: 1 via TRANSDERMAL
  Filled 2018-01-27: qty 1

## 2018-01-27 NOTE — ED Provider Notes (Signed)
[facility] Emergency Department Provider Note   ____________________________________________   First MD Initiated Contact with Patient 01/27/18 1051     (approximate)  I have reviewed the triage vital signs and the nursing notes.   HISTORY  Chief Complaint Knee Pain    HPI Diana Lester is a 84 y.o. female patient complaint 2 to 3 days of right diffuse knee pain.  Patient states 2 weeks ago she had a contusion to the knee that resolved with conservative care.  Patient denies any recent provocative incident for her knee complaint.  Patient rates the pain as 8/10.  Patient described the pain is achy.  Patient state pain increased with flexion of the knee.  No palliative measure for complaint.  Past Medical History:  Diagnosis Date  . Arthritis   . Breast cancer ([place]) 1995   LT LUMPECTOMY & s/p XRT  . Depression   . Hx of colonic polyp   . Hypercholesterolemia   . Hypertension   . Melanoma ([place])   . Personal history of radiation therapy   . Spinal stenosis    lumbar    Patient Active Problem List   Diagnosis Date Noted  . Urinary frequency 06/25/2017  . Lumbar spinal stenosis 01/23/2016  . Hypertension 01/23/2016  . History of depression 01/23/2016  . 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 Right lower quadrant abdominal pain   . Anxiety 10/20/2015  . Loss of weight 10/20/2015  . Hyponatremia 08/07/2014  . Health care maintenance 05/05/2014  . Skin cancer 01/29/2014  . Other malaise and fatigue 10/21/2013  . Skin lesion of cheek 10/21/2013  . Renal cyst 03/22/2013  . Chronic kidney disease, stage III (moderate) ([place]) 03/14/2013  . Abdominal bruit 03/14/2013  . Spinal stenosis of lumbar region 12/09/2012  . Bulging disc 12/09/2012  . History of colonic polyps 12/09/2012  . History of breast cancer 12/09/2012  . Depression 12/09/2012  . Essential hypertension, benign 12/09/2012  . Hyperlipidemia 12/09/2012    Past Surgical History:   Procedure Laterality Date  . BREAST BIOPSY Left 2006   EXCISIONAL - NEG  . BREAST BIOPSY Left 1995   LT LUMPECTOMY FOR CA  . BREAST LUMPECTOMY Left 1995  . EYE SURGERY Bilateral    cataract excision  . LAPAROSCOPIC APPENDECTOMY N/A 01/11/2016   Procedure: APPENDECTOMY LAPAROSCOPIC;  Surgeon: Dia Crawford III, MD;  Location: [facility] ORS;  Service: General;  Laterality: N/A;    Prior to Admission medications   Medication Sig Start Date End Date Taking? Authorizing Provider  aspirin 81 MG tablet Take 81 mg by mouth daily.    [provider]  Calcium Carb-Cholecalciferol (CALCIUM-VITAMIN D) 500-200 MG-UNIT tablet Take 2 tablets by mouth.     [provider]  clonazePAM (KLONOPIN) 0.5 MG tablet Take 1 tablet (0.5 mg total) by mouth daily as needed for anxiety. 12/14/17   Einar Pheasant, MD  conjugated estrogens (PREMARIN) vaginal cream Apply 1 pea sized amount three times weekly 10/21/17   Billey Co, MD  Lidocaine (ZTLIDO) 1.8 % PTCH Apply 1 patch topically daily. 01/27/18   Sable Feil, PA-C  lisinopril-hydrochlorothiazide (PRINZIDE,ZESTORETIC) 20-12.5 MG tablet TAKE 1 TABLET BY MOUTH DAILY 11/21/17   Einar Pheasant, MD  lovastatin (MEVACOR) 40 MG tablet Take 1-2 tablets (40-80 mg total) by mouth daily. 10/04/17   Einar Pheasant, MD  mirtazapine (REMERON) 30 MG tablet TAKE 1 TABLET BY MOUTH AT BEDTIME 12/26/17   Einar Pheasant, MD  pregabalin (LYRICA) 50 MG capsule  Take 50 mg by mouth 2 (two) times daily.     [provider]  trospium (SANCTURA) 20 MG tablet Take 1 tablet (20 mg total) by mouth 2 (two) times daily. 10/21/17   Billey Co, MD    Allergies Contrast media [iodinated diagnostic agents]  Family History  Problem Relation Age of Onset  . Arthritis Mother   . Hyperlipidemia Mother   . Hypertension Mother   . Hyperlipidemia Sister   . Kidney cancer Sister   . Breast cancer Neg Hx     Social History Social History   Tobacco Use  .  Smoking status: Former Smoker    Packs/day: 0.50    Years: 4.00    Pack years: 2.00    Types: Cigarettes    Last attempt to quit: 12/09/1984    Years since quitting: 33.1  . Smokeless tobacco: Never Used  . Tobacco comment: quit 12/09/84  Substance Use Topics  . Alcohol use: No    Alcohol/week: 0.0 standard drinks    Comment: sparingly  . Drug use: No    Review of Systems  Constitutional: No fever/chills Eyes: No visual changes. ENT: No sore throat. Cardiovascular: Denies chest pain. Respiratory: Denies shortness of breath. Gastrointestinal: No abdominal pain.  No nausea, no vomiting.  No diarrhea.  No constipation. Genitourinary: Negative for dysuria. Musculoskeletal: Negative for back pain. Skin: Negative for rash. Neurological: Negative for headaches, focal weakness or numbness. Psychiatric:Depression Endocrine:Hyperlipidemia and hypertension.   ____________________________________________   PHYSICAL EXAM:  VITAL SIGNS: ED Triage Vitals  Enc Vitals Group     BP 01/27/18 1038 102/69     Pulse Rate 01/27/18 1038 85     Resp 01/27/18 1038 16     Temp 01/27/18 1038 99.2 F (37.3 C)     Temp Source 01/27/18 1038 Oral     SpO2 01/27/18 1038 97 %     Weight 01/27/18 1039 130 lb (59 kg)     Height 01/27/18 1039 5\' 3"  (1.6 m)     Head Circumference --      Peak Flow --      Pain Score 01/27/18 1039 8     Pain Loc --      Pain Edu? --      Excl. in [place]? --    Constitutional: Alert and oriented. Well appearing and in no acute distress. Cardiovascular: Normal rate, regular rhythm. Grossly normal heart sounds.  Good peripheral circulation. Respiratory: Normal respiratory effort.  No retractions. Lungs CTAB. Musculoskeletal: No lower extremity tenderness nor edema.  No joint effusions. Neurologic:  Normal speech and language. No gross focal neurologic deficits are appreciated. No gait instability. Skin:  Skin is warm, dry and intact. No rash noted. Psychiatric: Mood  and affect are normal. Speech and behavior are normal.  ____________________________________________   LABS (all labs ordered are listed, but only abnormal results are displayed)  Labs Reviewed - No data to display ____________________________________________  EKG   ____________________________________________  RADIOLOGY  ED MD interpretation:    Official radiology report(s): Dg Knee Complete 4 Views Right  Result Date: 01/27/2018 CLINICAL DATA:  Knee pain.  Fall 3 weeks ago. EXAM: RIGHT KNEE - COMPLETE 4+ VIEW COMPARISON:  None. FINDINGS: No evidence of fracture, dislocation, or joint effusion. Mild medial and patellofemoral compartment osteoarthritis. Soft tissues are unremarkable. IMPRESSION: 1. No acute findings. 2. Osteoarthritis. Electronically Signed   By: Kerby Moors M.D.   On: 01/27/2018 12:14    ____________________________________________   PROCEDURES  Procedure(s)  performed: None  Procedures  Critical Care performed: No  ____________________________________________   INITIAL IMPRESSION / ASSESSMENT AND PLAN / ED COURSE  As part of my medical decision making, I reviewed the following data within the electronic MEDICAL RECORD NUMBER    Right knee pain secondary to osteoarthritis.  Discussed x-ray findings with patient.  Patient given discharge care instruction.  Patient elected to try Lidoderm patches instead of oral medication.  Patient advised follow-up PCP.      ____________________________________________   FINAL CLINICAL IMPRESSION(S) / ED DIAGNOSES  Final diagnoses:  Primary osteoarthritis of right knee     ED Discharge Orders         Ordered    Lidocaine (ZTLIDO) 1.8 % PTCH  Daily     01/27/18 1227           Note:  This document was prepared using Dragon voice recognition software and may include unintentional dictation errors.    Sable Feil, PA-C 01/27/18 1229    Earleen Newport, MD 01/27/18 1248

## 2018-01-27 NOTE — ED Notes (Signed)
See triage note  Presents with pain to right knee  States she fell about 3 weeks ago  States conts to be sore  But having increased discomfort with bending

## 2018-01-27 NOTE — ED Triage Notes (Signed)
Patient reports pain in right knee for 2-3 days. Denies any known injury. No obvious swelling or redness noted.

## 2018-02-18 ENCOUNTER — Other Ambulatory Visit: Payer: Self-pay | Admitting: Internal Medicine

## 2018-03-03 ENCOUNTER — Other Ambulatory Visit: Payer: Self-pay

## 2018-03-03 ENCOUNTER — Ambulatory Visit
Admission: EM | Admit: 2018-03-03 | Discharge: 2018-03-03 | Disposition: A | Discharge status: Home / Self Care | Payer: PPO | Attending: Family Medicine | Admitting: Family Medicine

## 2018-03-03 ENCOUNTER — Encounter: Payer: Self-pay | Admitting: Emergency Medicine

## 2018-03-03 DIAGNOSIS — M1712 Unilateral primary osteoarthritis, left knee: Secondary | ICD-10-CM | POA: Diagnosis not present

## 2018-03-03 DIAGNOSIS — H6123 Impacted cerumen, bilateral: Secondary | ICD-10-CM | POA: Diagnosis not present

## 2018-03-03 MED ORDER — MELOXICAM 7.5 MG PO TABS: 7.5000 mg | ORAL_TABLET | Freq: Every day | ORAL | 0 refills | Status: AC

## 2018-03-03 MED ORDER — METHYLPREDNISOLONE 4 MG PO TBPK: ORAL_TABLET | ORAL | 0 refills | Status: DC

## 2018-03-03 NOTE — ED Provider Notes (Signed)
[facility]    CSN: [id number] Arrival date & time: 03/03/18  1101     History   Chief Complaint Chief Complaint  Patient presents with  . Ear Fullness  . Knee Pain    HPI Diana Lester is a 84 y.o. female.   Diana Lester is a 84 y.o. female that complains of pain in the anterior aspect of the left knee without radiation. Patient was initially evaluated in the ED on 01/27/18 after a mechanical fall a few days prior to that.  X-rays done was negative for anything acute but did show some osteoarthritis.  Patient was prescribed lidocaine patches which was initially helpful for the pain.  Reports that she was doing relatively well up until a week or so ago when the pain has returned.  She denies any new injury.  She is on tramadol and Lyrica chronically for other issues.  No improvement in the knee pain with these therapies. Patient describes pain as constant aching. Pain severity now is 8 /10. Pain is aggravated by movement and weight bearing. Pain is alleviated by nothing.  She denies any swelling, numbness, tingling, weakness, loss of sensation, loss of motion or inability to bear weight.   Diana Lester also complaints of diminished hearing in both ears for the past several days. There is a prior history of cerumen impaction. The patient has not been using ear drops to loosen wax immediately prior to this visit. The patient denies ear pain.           Past Medical History:  Diagnosis Date  . Arthritis   . Breast cancer ([place]) 1995   LT LUMPECTOMY & s/p XRT  . Depression   . Hx of colonic polyp   . Hypercholesterolemia   . Hypertension   . Melanoma ([place])   . Personal history of radiation therapy   . Spinal stenosis    lumbar    Patient Active Problem List   Diagnosis Date Noted  . Urinary frequency 06/25/2017  . Lumbar spinal stenosis 01/23/2016  . Hypertension 01/23/2016  . History of depression 01/23/2016  . 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  Right lower quadrant abdominal pain   . Anxiety 10/20/2015  . Loss of weight 10/20/2015  . Hyponatremia 08/07/2014  . Health care maintenance 05/05/2014  . Skin cancer 01/29/2014  . Other malaise and fatigue 10/21/2013  . Skin lesion of cheek 10/21/2013  . Renal cyst 03/22/2013  . Chronic kidney disease, stage III (moderate) ([place]) 03/14/2013  . Abdominal bruit 03/14/2013  . Spinal stenosis of lumbar region 12/09/2012  . Bulging disc 12/09/2012  . History of colonic polyps 12/09/2012  . History of breast cancer 12/09/2012  . Depression 12/09/2012  . Essential hypertension, benign 12/09/2012  . Hyperlipidemia 12/09/2012    Past Surgical History:  Procedure Laterality Date  . BREAST BIOPSY Left 2006   EXCISIONAL - NEG  . BREAST BIOPSY Left 1995   LT LUMPECTOMY FOR CA  . BREAST LUMPECTOMY Left 1995  . EYE SURGERY Bilateral    cataract excision  . LAPAROSCOPIC APPENDECTOMY N/A 01/11/2016   Procedure: APPENDECTOMY LAPAROSCOPIC;  Surgeon: Dia Crawford III, MD;  Location: [facility] ORS;  Service: General;  Laterality: N/A;    OB History   No obstetric history on file.      Home Medications    Prior to Admission medications   Medication Sig Start Date End Date Taking? Authorizing Provider  aspirin 81 MG tablet Take 81 mg by mouth  daily.   Yes [provider]  Calcium Carb-Cholecalciferol (CALCIUM-VITAMIN D) 500-200 MG-UNIT tablet Take 2 tablets by mouth.    Yes [provider]  clonazePAM (KLONOPIN) 0.5 MG tablet Take 1 tablet (0.5 mg total) by mouth daily as needed for anxiety. 12/14/17  Yes Einar Pheasant, MD  conjugated estrogens (PREMARIN) vaginal cream Apply 1 pea sized amount three times weekly 10/21/17  Yes Billey Co, MD  Lidocaine (ZTLIDO) 1.8 % PTCH Apply 1 patch topically daily. 01/27/18  Yes Sable Feil, PA-C  lisinopril-hydrochlorothiazide (PRINZIDE,ZESTORETIC) 20-12.5 MG tablet TAKE 1 TABLET BY MOUTH DAILY 02/20/18  Yes Crecencio Mc, MD    lovastatin (MEVACOR) 40 MG tablet Take 1-2 tablets (40-80 mg total) by mouth daily. 10/04/17  Yes Einar Pheasant, MD  mirtazapine (REMERON) 30 MG tablet TAKE 1 TABLET BY MOUTH AT BEDTIME 12/26/17  Yes Einar Pheasant, MD  pregabalin (LYRICA) 50 MG capsule Take 50 mg by mouth 2 (two) times daily.    Yes [provider]  trospium (SANCTURA) 20 MG tablet Take 1 tablet (20 mg total) by mouth 2 (two) times daily. 10/21/17  Yes Billey Co, MD  meloxicam (MOBIC) 7.5 MG tablet Take 1 tablet (7.5 mg total) by mouth daily. 03/03/18 04/02/18  Enrique Sack, FNP  methylPREDNISolone (MEDROL DOSEPAK) 4 MG TBPK tablet Take as prescribed 03/03/18   Enrique Sack, FNP    Family History Family History  Problem Relation Age of Onset  . Arthritis Mother   . Hyperlipidemia Mother   . Hypertension Mother   . Hyperlipidemia Sister   . Kidney cancer Sister   . Breast cancer Neg Hx     Social History Social History   Tobacco Use  . Smoking status: Former Smoker    Packs/day: 0.50    Years: 4.00    Pack years: 2.00    Types: Cigarettes    Last attempt to quit: 12/09/1984    Years since quitting: 33.2  . Smokeless tobacco: Never Used  . Tobacco comment: quit 12/09/84  Substance Use Topics  . Alcohol use: No    Alcohol/week: 0.0 standard drinks    Comment: sparingly  . Drug use: No     Allergies   Contrast media [iodinated diagnostic agents]   Review of Systems Review of Systems  Constitutional: Negative.   HENT: Positive for hearing loss. Negative for ear discharge and ear pain.   Eyes: Negative.   Respiratory: Negative.   Cardiovascular: Negative.   Gastrointestinal: Negative.   Musculoskeletal:       Left knee pain   All other systems reviewed and are negative.    Physical Exam Triage Vital Signs ED Triage Vitals  Enc Vitals Group     BP 03/03/18 1119 (!) 111/59     Pulse Rate 03/03/18 1119 82     Resp 03/03/18 1119 18     Temp 03/03/18 1119 98.4 F (36.9  C)     Temp Source 03/03/18 1119 Oral     SpO2 03/03/18 1119 98 %     Weight 03/03/18 1116 130 lb (59 kg)     Height 03/03/18 1116 5\' 3"  (1.6 m)     Head Circumference --      Peak Flow --      Pain Score 03/03/18 1116 8     Pain Loc --      Pain Edu? --      Excl. in [place]? --    No data found.  Updated Vital Signs  BP (!) 111/59 (BP Location: Right Arm)   Pulse 82   Temp 98.4 F (36.9 C) (Oral)   Resp 18   Ht 5\' 3"  (1.6 m)   Wt 130 lb (59 kg)   SpO2 98%   BMI 23.03 kg/m   Visual Acuity Right Eye Distance:   Left Eye Distance:   Bilateral Distance:    Right Eye Near:   Left Eye Near:    Bilateral Near:     Physical Exam Constitutional:      General: She is not in acute distress.    Appearance: Normal appearance.  HENT:     Right Ear: Hearing and external ear normal.     Left Ear: Hearing and external ear normal.     Ears:     Comments: Auditory canal of both ears are completely obstructed with cerumen.  Neck:     Musculoskeletal: Normal range of motion and neck supple.  Cardiovascular:     Rate and Rhythm: Normal rate.  Pulmonary:     Effort: Pulmonary effort is normal.  Musculoskeletal: Normal range of motion.     Left knee: She exhibits normal range of motion, no swelling, no effusion, no ecchymosis, no deformity and no erythema. Tenderness found.  Skin:    General: Skin is warm and dry.  Neurological:     General: No focal deficit present.     Mental Status: She is alert and oriented to person, place, and time.  Psychiatric:        Behavior: Behavior normal.      UC Treatments / Results  Labs (all labs ordered are listed, but only abnormal results are displayed) Labs Reviewed - No data to display  EKG None  Radiology No results found.  Procedures Ear Cerumen Removal Date/Time: 03/03/2018 12:07 PM Performed by: Tacey Heap, CMA Authorized by: Enrique Sack, FNP   Consent:    Consent obtained:  Verbal   Consent given by:   Patient   Risks discussed:  Bleeding, infection, pain, dizziness, incomplete removal and TM perforation   Alternatives discussed:  No treatment Procedure details:    Location:  L ear and R ear   Procedure type: curette   Post-procedure details:    Inspection:  TM intact   Hearing quality:  Improved   Patient tolerance of procedure:  Tolerated well, no immediate complications   (including critical care time)  Medications Ordered in UC Medications - No data to display  Initial Impression / Assessment and Plan / UC Course  I have reviewed the triage vital signs and the nursing notes.  Pertinent labs & imaging results that were available during my care of the patient were reviewed by me and considered in my medical decision making (see chart for details).     84 year old female with known history of left knee osteoarthritis presenting with pain for the past week or so. She had a previous fall several weeks ago with negative imaging.  No new injury reported.  No swelling or erythema.  Pain is likely due to an arthritic flare.  Short course of steroids prescribed as well as starting Mobic daily.  Patient also complains of diminished hearing bilaterally without pain. She has a history of cerumen impaction.  She underwent bilateral ear irrigation in the clinic with significant improvement in symptomology.  Today's evaluation has revealed no signs of a dangerous process. Discussed diagnosis with patient. Patient aware of their diagnosis, possible red flag symptoms to watch out for and need  for close follow up. Patient understands verbal and written discharge instructions. Patient comfortable with plan and disposition.  Patient has a clear mental status at this time, good insight into illness (after discussion and teaching) and has clear judgment to make decisions regarding their care.  Documentation was completed with the aid of voice recognition software. Transcription may contain typographical  errors. Final Clinical Impressions(s) / UC Diagnoses   Final diagnoses:  Osteoarthritis of left knee, unspecified osteoarthritis type  Bilateral impacted cerumen     Discharge Instructions     Take medications as prescribed. Follow-up with your primary care provider next week as scheduled     ED Prescriptions    Medication Sig Dispense Auth. Provider   meloxicam (MOBIC) 7.5 MG tablet Take 1 tablet (7.5 mg total) by mouth daily. 30 tablet Enrique Sack, FNP   methylPREDNISolone (MEDROL DOSEPAK) 4 MG TBPK tablet Take as prescribed 21 tablet Enrique Sack, FNP     Controlled Substance Prescriptions [place] Controlled Substance Registry consulted? Not Applicable   Enrique Sack, [place] 03/03/18 [phone]

## 2018-03-03 NOTE — Discharge Instructions (Signed)
Take medications as prescribed. Follow-up with your primary care provider next week as scheduled

## 2018-03-03 NOTE — ED Triage Notes (Signed)
Patient c/o ear wax in her right ear. Patient also c/o right knee pain that started a few days ago. Patient stated she was seen in the ER over 5 weeks ago and was diagnosed with osteoarthritis.

## 2018-03-09 ENCOUNTER — Other Ambulatory Visit: Payer: Self-pay | Admitting: Internal Medicine

## 2018-03-09 ENCOUNTER — Encounter: Payer: Self-pay | Admitting: Internal Medicine

## 2018-03-09 ENCOUNTER — Ambulatory Visit (INDEPENDENT_AMBULATORY_CARE_PROVIDER_SITE_OTHER): Payer: PPO | Admitting: Internal Medicine

## 2018-03-09 VITALS — BP 138/82 | HR 82 | Temp 98.1°F | Wt 138.2 lb

## 2018-03-09 DIAGNOSIS — Z853 Personal history of malignant neoplasm of breast: Secondary | ICD-10-CM | POA: Diagnosis not present

## 2018-03-09 DIAGNOSIS — F419 Anxiety disorder, unspecified: Secondary | ICD-10-CM | POA: Diagnosis not present

## 2018-03-09 DIAGNOSIS — E785 Hyperlipidemia, unspecified: Secondary | ICD-10-CM | POA: Diagnosis not present

## 2018-03-09 DIAGNOSIS — N183 Chronic kidney disease, stage 3 unspecified: Secondary | ICD-10-CM

## 2018-03-09 DIAGNOSIS — R739 Hyperglycemia, unspecified: Secondary | ICD-10-CM

## 2018-03-09 DIAGNOSIS — I10 Essential (primary) hypertension: Secondary | ICD-10-CM

## 2018-03-09 DIAGNOSIS — M25561 Pain in right knee: Secondary | ICD-10-CM

## 2018-03-09 DIAGNOSIS — F329 Major depressive disorder, single episode, unspecified: Secondary | ICD-10-CM | POA: Diagnosis not present

## 2018-03-09 DIAGNOSIS — F32A Depression, unspecified: Secondary | ICD-10-CM

## 2018-03-09 LAB — BASIC METABOLIC PANEL
BUN: 31 mg/dL — ABNORMAL HIGH (ref 6–23)
CO2: 31 mEq/L (ref 19–32)
Calcium: 10 mg/dL (ref 8.4–10.5)
Chloride: 100 mEq/L (ref 96–112)
Creatinine, Ser: 1.39 mg/dL — ABNORMAL HIGH (ref 0.40–1.20)
GFR: 38.29 mL/min — ABNORMAL LOW (ref >60.00)
Glucose, Bld: 89 mg/dL (ref 70–99)
POTASSIUM: 4.7 meq/L (ref 3.5–5.1)
Sodium: 140 mEq/L (ref 135–145)

## 2018-03-09 LAB — LIPID PANEL
CHOLESTEROL: 223 mg/dL — AB (ref 0–200)
HDL: 76.1 mg/dL (ref >39.00)
LDL Cholesterol: 108 mg/dL — ABNORMAL HIGH (ref 0–99)
NonHDL: 147.05
TRIGLYCERIDES: 197 mg/dL — AB (ref 0.0–149.0)
Total CHOL/HDL Ratio: 3
VLDL: 39.4 mg/dL (ref 0.0–40.0)

## 2018-03-09 LAB — HEMOGLOBIN A1C: Hgb A1c MFr Bld: 6 % (ref 4.6–6.5)

## 2018-03-09 LAB — HEPATIC FUNCTION PANEL
ALT: 12 U/L (ref 0–35)
AST: 14 U/L (ref 0–37)
Albumin: 4.4 g/dL (ref 3.5–5.2)
Alkaline Phosphatase: 71 U/L (ref 39–117)
Bilirubin, Direct: 0.1 mg/dL (ref 0.0–0.3)
Total Bilirubin: 0.4 mg/dL (ref 0.2–1.2)
Total Protein: 6.8 g/dL (ref 6.0–8.3)

## 2018-03-09 LAB — TSH: TSH: 2.1 u[IU]/mL (ref 0.35–4.50)

## 2018-03-09 NOTE — Patient Instructions (Signed)
Stop meloxicam  Can take tylenol extra strength - 2 tablets twice a day as needed for knee pain.

## 2018-03-09 NOTE — Progress Notes (Signed)
[name], female   DOB: 1930-12-20, 85 y.o.   MRN: [id number]   Subjective:    [name], female    DOB: October 22, 1930, 85 y.o.   MRN: [id number]  HPI  Patient here for a scheduled follow up.  She is accompanied by her sister.  History obtained from both of them.  Reports she is doing relatively well.  Memory - persistent issues.  Has seen neurology.  Her main complaint is that of right knee pain.  Was seen in ER after a fall.  Started with increased pain after fall.  Trial of lidocaine patches.  Evaluated again at [facility] - for knee pain.  Still persistent.  Has been on meloxicam.  Given her CKD - would like for her to hold on taking antiinflammatories.  Discussed tylenol.  Discussed ortho referral.  No chest pain.  No sob.  No acid reflux.  No abdominal pain.  Bowels moving.     Past Medical History:  Diagnosis Date  . Arthritis   . Breast cancer ([place]) 1995   LT LUMPECTOMY & s/p XRT  . Depression   . Hx of colonic polyp   . Hypercholesterolemia   . Hypertension   . Melanoma ([place])   . Personal history of radiation therapy   . Spinal stenosis    lumbar   Past Surgical History:  Procedure Laterality Date  . BREAST BIOPSY Left 2006   EXCISIONAL - NEG  . BREAST BIOPSY Left 1995   LT LUMPECTOMY FOR CA  . BREAST LUMPECTOMY Left 1995  . EYE SURGERY Bilateral    cataract excision  . LAPAROSCOPIC APPENDECTOMY N/A 01/11/2016   Procedure: APPENDECTOMY LAPAROSCOPIC;  Surgeon: Dia Crawford III, MD;  Location: [facility] ORS;  Service: General;  Laterality: N/A;   Family History  Problem Relation Age of Onset  . Arthritis Mother   . Hyperlipidemia Mother   . Hypertension Mother   . Hyperlipidemia Sister   . Kidney cancer Sister   . Breast cancer Neg Hx    Social History   Socioeconomic History  . Marital status: Widowed    Spouse name: Not on file  . Number of children: 2  . Years of education: Not on file  . Highest education level: Not on  file  Occupational History  . Not on file  Social Needs  . Financial resource strain: Not on file  . Food insecurity:    Worry: Not on file    Inability: Not on file  . Transportation needs:    Medical: Not on file    Non-medical: Not on file  Tobacco Use  . Smoking status: Former Smoker    Packs/day: 0.50    Years: 4.00    Pack years: 2.00    Types: Cigarettes    Last attempt to quit: 12/09/1984    Years since quitting: 33.2  . Smokeless tobacco: Never Used  . Tobacco comment: quit 12/09/84  Substance and Sexual Activity  . Alcohol use: No    Alcohol/week: 0.0 standard drinks    Comment: sparingly  . Drug use: No  . Sexual activity: Never  Lifestyle  . Physical activity:    Days per week: Not on file    Minutes per session: Not on file  . Stress: Not on file  Relationships  . Social connections:    Talks on phone: Not on file    Gets together: Not on file    Attends religious service: Not  on file    Active member of club or organization: Not on file    Attends meetings of clubs or organizations: Not on file    Relationship status: Not on file  Other Topics Concern  . Not on file  Social History Narrative  . Not on file    Outpatient Encounter Medications as of 03/09/2018  Medication Sig  . aspirin 81 MG tablet Take 81 mg by mouth daily.  . Calcium Carb-Cholecalciferol (CALCIUM-VITAMIN D) 500-200 MG-UNIT tablet Take 2 tablets by mouth.   . conjugated estrogens (PREMARIN) vaginal cream Apply 1 pea sized amount three times weekly  . Lidocaine (ZTLIDO) 1.8 % PTCH Apply 1 patch topically daily.  Marland Kitchen lisinopril-hydrochlorothiazide (PRINZIDE,ZESTORETIC) 20-12.5 MG tablet TAKE 1 TABLET BY MOUTH DAILY  . lovastatin (MEVACOR) 40 MG tablet Take 1-2 tablets (40-80 mg total) by mouth daily.  . meloxicam (MOBIC) 7.5 MG tablet Take 1 tablet (7.5 mg total) by mouth daily.  . methylPREDNISolone (MEDROL DOSEPAK) 4 MG TBPK tablet Take as prescribed  . mirtazapine (REMERON) 30 MG  tablet TAKE 1 TABLET BY MOUTH AT BEDTIME  . pregabalin (LYRICA) 50 MG capsule Take 50 mg by mouth 2 (two) times daily.   . [DISCONTINUED] clonazePAM (KLONOPIN) 0.5 MG tablet Take 1 tablet (0.5 mg total) by mouth daily as needed for anxiety.  . trospium (SANCTURA) 20 MG tablet Take 1 tablet (20 mg total) by mouth 2 (two) times daily. (Patient not taking: Reported on 03/09/2018)   No facility-administered encounter medications on file as of 03/09/2018.     Review of Systems  Constitutional: Negative for appetite change and unexpected weight change.  HENT: Negative for congestion.   Respiratory: Negative for cough, chest tightness and shortness of breath.   Cardiovascular: Negative for chest pain, palpitations and leg swelling.  Gastrointestinal: Negative for abdominal pain, diarrhea, nausea and vomiting.  Genitourinary: Negative for difficulty urinating and dysuria.  Musculoskeletal: Negative for myalgias.       Persistent right knee pain.    Skin: Negative for color change and rash.  Neurological: Negative for dizziness and headaches.  Psychiatric/Behavioral: Negative for agitation and dysphoric mood.       Objective:    Physical Exam Constitutional:      General: She is not in acute distress.    Appearance: Normal appearance.  HENT:     Nose: Nose normal. No congestion.     Mouth/Throat:     Pharynx: No oropharyngeal exudate or posterior oropharyngeal erythema.  Neck:     Musculoskeletal: Neck supple. No muscular tenderness.     Thyroid: No thyromegaly.  Cardiovascular:     Rate and Rhythm: Normal rate and regular rhythm.  Pulmonary:     Effort: No respiratory distress.     Breath sounds: Normal breath sounds. No wheezing.  Abdominal:     General: Bowel sounds are normal.     Palpations: Abdomen is soft.     Tenderness: There is no abdominal tenderness.  Musculoskeletal:        General: No swelling or tenderness.  Lymphadenopathy:     Cervical: No cervical adenopathy.    Skin:    Findings: No erythema or rash.  Neurological:     Mental Status: She is alert.  Psychiatric:        Mood and Affect: Mood normal.        Behavior: Behavior normal.     BP 138/82 (BP Location: Right Arm, Patient Position: Sitting, Cuff Size: Normal)   Pulse 82  Temp 98.1 F (36.7 C)   Wt 138 lb 3.2 oz (62.7 kg)   SpO2 96%   BMI 24.48 kg/m  Wt Readings from Last 3 Encounters:  03/09/18 138 lb 3.2 oz (62.7 kg)  03/03/18 130 lb (59 kg)  01/27/18 130 lb (59 kg)     Lab Results  Component Value Date   WBC 9.2 11/02/2017   HGB 15.4 (H) 11/02/2017   HCT 45.1 11/02/2017   PLT 269.0 11/02/2017   GLUCOSE 89 03/09/2018   CHOL 223 (H) 03/09/2018   TRIG 197.0 (H) 03/09/2018   HDL 76.10 03/09/2018   LDLDIRECT 126.7 04/17/2013   LDLCALC 108 (H) 03/09/2018   ALT 12 03/09/2018   AST 14 03/09/2018   NA 140 03/09/2018   K 4.7 03/09/2018   CL 100 03/09/2018   CREATININE 1.39 (H) 03/09/2018   BUN 31 (H) 03/09/2018   CO2 31 03/09/2018   TSH 2.10 03/09/2018   HGBA1C 6.0 03/09/2018       Assessment & Plan:   Problem List Items Addressed This Visit    Anxiety    On clonazepam.  Only takes one per day.  Feels needs to control her anxiety.  Stable.        Chronic kidney disease, stage III (moderate) (HCC) - Primary    Has seen nephrology.  Will stop meloxicam.  Recheck metabolic panel.        Depression    On remeron.  Stable.        Essential hypertension, benign    Blood pressure under good control.  Continue same medication regimen.  Follow pressures.  Follow metabolic panel.        Relevant Orders   TSH (Completed)   Basic metabolic panel (Completed)   History of breast cancer    Mammogram 12/19/17 - Birads I.       Hyperlipidemia    On lovastatin.  Follow lipid panel and liver function tests.        Relevant Orders   Hepatic function panel (Completed)   Lipid panel (Completed)   Hypertension    Blood pressure under good control.  Continue same  medication regimen.  Follow pressures.  Follow metabolic panel.        Right knee pain    Persistent knee pain.  Has been on meloxicam.  Stop meloxicam given CKD.  Tylenol as directed.  Refer to ortho given persistent pain.        Relevant Orders   Ambulatory referral to Orthopedic Surgery    Other Visit Diagnoses    Hyperglycemia       Relevant Orders   Hemoglobin A1c (Completed)       Diana Pheasant, MD

## 2018-03-11 ENCOUNTER — Encounter: Payer: Self-pay | Admitting: Internal Medicine

## 2018-03-11 ENCOUNTER — Other Ambulatory Visit: Payer: Self-pay | Admitting: Internal Medicine

## 2018-03-11 DIAGNOSIS — N183 Chronic kidney disease, stage 3 unspecified: Secondary | ICD-10-CM

## 2018-03-11 DIAGNOSIS — M25561 Pain in right knee: Secondary | ICD-10-CM | POA: Insufficient documentation

## 2018-03-11 NOTE — Assessment & Plan Note (Signed)
Has seen nephrology.  Will stop meloxicam.  Recheck metabolic panel.

## 2018-03-11 NOTE — Assessment & Plan Note (Signed)
Persistent knee pain.  Has been on meloxicam.  Stop meloxicam given CKD.  Tylenol as directed.  Refer to ortho given persistent pain.

## 2018-03-11 NOTE — Assessment & Plan Note (Signed)
Blood pressure under good control.  Continue same medication regimen.  Follow pressures.  Follow metabolic panel.   

## 2018-03-11 NOTE — Assessment & Plan Note (Signed)
Mammogram 12/19/17 - Birads I.

## 2018-03-11 NOTE — Assessment & Plan Note (Signed)
On clonazepam.  Only takes one per day.  Feels needs to control her anxiety.  Stable.

## 2018-03-11 NOTE — Assessment & Plan Note (Signed)
On lovastatin.  Follow lipid panel and liver function tests.

## 2018-03-11 NOTE — Assessment & Plan Note (Signed)
On remeron.  Stable.

## 2018-03-11 NOTE — Progress Notes (Signed)
Order placed for nephrology referral.   °

## 2018-03-14 ENCOUNTER — Telehealth: Payer: Self-pay

## 2018-03-14 NOTE — Telephone Encounter (Signed)
Labs were placed to go out on 03/10/2018. They may not have went in the mail until 03/13/18. If doesn't receive, will resend.

## 2018-03-14 NOTE — Telephone Encounter (Signed)
Copied from [place] [id number]. Topic: General - Other >> Mar 14, 2018  1:10 PM Oneta Rack wrote:  Osvaldo Human name: Diana Lester  Relation to pt: sister  Call back number: [phone]   Reason for call:  Sister checking on the status if lab results were mailed to  [place] [address]  Please note.

## 2018-03-16 ENCOUNTER — Telehealth: Payer: Self-pay

## 2018-03-16 NOTE — Telephone Encounter (Signed)
Copied from [place] [id number]. Topic: Referral - Status >> Mar 16, 2018  8:42 AM Lennox Solders wrote: Reason for CRM: pt sister martha hess is calling to check on the referral  to dr lateef  nephrologist at [facility] location. Phone number [phone] and  fax [phone]   **I spoke with patient's sister & asked that she give referral a few more days. Referral was just placed 03/11/18. I asked that she call back if she has not heard from [facility] in the next few days to call back.**

## 2018-03-21 DIAGNOSIS — M1711 Unilateral primary osteoarthritis, right knee: Secondary | ICD-10-CM | POA: Diagnosis not present

## 2018-03-21 DIAGNOSIS — M25561 Pain in right knee: Secondary | ICD-10-CM | POA: Diagnosis not present

## 2018-03-21 DIAGNOSIS — G8929 Other chronic pain: Secondary | ICD-10-CM | POA: Diagnosis not present

## 2018-03-21 DIAGNOSIS — S8001XA Contusion of right knee, initial encounter: Secondary | ICD-10-CM | POA: Diagnosis not present

## 2018-04-01 ENCOUNTER — Other Ambulatory Visit: Payer: Self-pay | Admitting: Internal Medicine

## 2018-04-08 ENCOUNTER — Other Ambulatory Visit: Payer: Self-pay | Admitting: Internal Medicine

## 2018-06-02 ENCOUNTER — Other Ambulatory Visit: Payer: Self-pay

## 2018-06-02 MED ORDER — CLONAZEPAM 0.5 MG PO TABS: ORAL_TABLET | ORAL | 0 refills | Status: DC

## 2018-06-02 NOTE — Progress Notes (Signed)
Last OV was 03/2018 Next appt. 11/07/2018 Last refill 03/09/2018.  Legrand Lasser,cma  rx ok'd for clonazepam #90 with no refills.   Dr Nicki Reaper

## 2018-06-12 ENCOUNTER — Ambulatory Visit: Payer: PPO | Admitting: Internal Medicine

## 2018-06-29 DIAGNOSIS — M47816 Spondylosis without myelopathy or radiculopathy, lumbar region: Secondary | ICD-10-CM | POA: Diagnosis not present

## 2018-07-14 ENCOUNTER — Other Ambulatory Visit: Payer: Self-pay | Admitting: Nephrology

## 2018-07-14 DIAGNOSIS — I10 Essential (primary) hypertension: Secondary | ICD-10-CM | POA: Diagnosis not present

## 2018-07-14 DIAGNOSIS — N183 Chronic kidney disease, stage 3 unspecified: Secondary | ICD-10-CM

## 2018-07-17 ENCOUNTER — Other Ambulatory Visit: Payer: Self-pay

## 2018-07-17 ENCOUNTER — Telehealth: Payer: Self-pay | Admitting: Internal Medicine

## 2018-07-17 MED ORDER — MIRTAZAPINE 30 MG PO TABS: 30.0000 mg | ORAL_TABLET | Freq: Every day | ORAL | 0 refills | Status: DC

## 2018-07-17 NOTE — Telephone Encounter (Signed)
Patient is calling back, she said she is out of the medication. Tried to call to the office to set up the appt. No answer, please call pt

## 2018-07-17 NOTE — Telephone Encounter (Signed)
Copied from [place] [id number]. Topic: Quick Communication - Rx Refill/Question >> Jul 17, 2018  8:17 AM Celene Kras A wrote: Medication: mirtazapine (REMERON) 30 MG tablet  Has the patient contacted their pharmacy? Yes.  Pt states she got in contact with pharmacy 3x and they did not get in touch with office. Pt states she took her last pill yesterday and is needing this medication now. Please advise.  (Agent: If no, request that the patient contact the pharmacy for the refill.) (Agent: If yes, when and what did the pharmacy advise?)  Preferred Pharmacy (with phone number or street name): [facility] [place], [address] [place] [address] Phone: [phone] Fax: [phone] Not a 24 hour pharmacy; exact hours not known.    Agent: Please be advised that RX refills may take up to 3 business days. We ask that you follow-up with your pharmacy.

## 2018-07-17 NOTE — Telephone Encounter (Signed)
I sent in a 90 day for her, she has agreed to f/u appt and is scheduled for Thursday.

## 2018-07-17 NOTE — Telephone Encounter (Signed)
I am ok to refill x 1, but she needs a f/u appt with me.  See if she is ok to schedule.

## 2018-07-20 ENCOUNTER — Other Ambulatory Visit: Payer: Self-pay

## 2018-07-20 ENCOUNTER — Encounter: Payer: Self-pay | Admitting: Internal Medicine

## 2018-07-20 ENCOUNTER — Ambulatory Visit (INDEPENDENT_AMBULATORY_CARE_PROVIDER_SITE_OTHER): Payer: PPO | Admitting: Internal Medicine

## 2018-07-20 DIAGNOSIS — E785 Hyperlipidemia, unspecified: Secondary | ICD-10-CM

## 2018-07-20 DIAGNOSIS — F329 Major depressive disorder, single episode, unspecified: Secondary | ICD-10-CM | POA: Diagnosis not present

## 2018-07-20 DIAGNOSIS — F419 Anxiety disorder, unspecified: Secondary | ICD-10-CM

## 2018-07-20 DIAGNOSIS — M48061 Spinal stenosis, lumbar region without neurogenic claudication: Secondary | ICD-10-CM

## 2018-07-20 DIAGNOSIS — F32A Depression, unspecified: Secondary | ICD-10-CM

## 2018-07-20 DIAGNOSIS — D582 Other hemoglobinopathies: Secondary | ICD-10-CM | POA: Diagnosis not present

## 2018-07-20 DIAGNOSIS — M25561 Pain in right knee: Secondary | ICD-10-CM | POA: Diagnosis not present

## 2018-07-20 DIAGNOSIS — N183 Chronic kidney disease, stage 3 unspecified: Secondary | ICD-10-CM

## 2018-07-20 DIAGNOSIS — I10 Essential (primary) hypertension: Secondary | ICD-10-CM | POA: Diagnosis not present

## 2018-07-20 NOTE — Progress Notes (Signed)
[name], female   DOB: July 04, 1930, 85 y.o.   MRN: [id number]   Virtual Visit via Telephone Note  This visit type was conducted due to national recommendations for restrictions regarding the COVID-19 pandemic (e.g. social distancing).  This format is felt to be most appropriate for this patient at this time.  All issues noted in this document were discussed and addressed.  No physical exam was performed (except for noted visual exam findings with Video Visits).   I connected with Theophilus Kinds by telephone and verified that I am speaking with the correct person using two identifiers. Location patient: home Location provider: work  Persons participating in the virtual visit: patient, provider  I discussed the limitations, risks, security and privacy concerns of performing an evaluation and management service by telephone and the availability of in person appointments.  The patient expressed understanding and agreed to proceed.   Reason for visit: scheduled follow up.   HPI: She reports she is doing relatively well. Trying to stay active around her house.  Not going out due to COVID restrictions.  No fever.  No cough or chest congestion.  Was having some right knee pain.  Support sleeve is helping.  Has known CKD.  Seeing Dr Holley Raring.  Seen 07/14/18.  Scheduled for renal ultrasound 08/02/18.  She is walking daily.  No chest pain.  No sob.  No acid reflux.  No abdominal pain.  Bowels moving.  Sees Dr Sharlet Salina for her back.  Stable.  Taking clonazepam.  Stable.  Writes notes for reminders. Discussed the need for labs.     ROS: See pertinent positives and negatives per HPI.  Past Medical History:  Diagnosis Date  . Arthritis   . Breast cancer ([place]) 1995   LT LUMPECTOMY & s/p XRT  . Depression   . Hx of colonic polyp   . Hypercholesterolemia   . Hypertension   . Melanoma ([place])   . Personal history of radiation therapy   . Spinal stenosis    lumbar    Past Surgical History:   Procedure Laterality Date  . BREAST BIOPSY Left 2006   EXCISIONAL - NEG  . BREAST BIOPSY Left 1995   LT LUMPECTOMY FOR CA  . BREAST LUMPECTOMY Left 1995  . EYE SURGERY Bilateral    cataract excision  . LAPAROSCOPIC APPENDECTOMY N/A 01/11/2016   Procedure: APPENDECTOMY LAPAROSCOPIC;  Surgeon: Dia Crawford III, MD;  Location: [facility] ORS;  Service: General;  Laterality: N/A;    Family History  Problem Relation Age of Onset  . Arthritis Mother   . Hyperlipidemia Mother   . Hypertension Mother   . Hyperlipidemia Sister   . Kidney cancer Sister   . Breast cancer Neg Hx     SOCIAL HX: reviewed.    Current Outpatient Medications:  .  aspirin 81 MG tablet, Take 81 mg by mouth daily., Disp: , Rfl:  .  Calcium Carb-Cholecalciferol (CALCIUM-VITAMIN D) 500-200 MG-UNIT tablet, Take 2 tablets by mouth. , Disp: , Rfl:  .  clonazePAM (KLONOPIN) 0.5 MG tablet, TAKE 1 TABLET(0.5 MG) BY MOUTH DAILY AS NEEDED FOR ANXIETY, Disp: 90 tablet, Rfl: 0 .  conjugated estrogens (PREMARIN) vaginal cream, Apply 1 pea sized amount three times weekly, Disp: 42.5 g, Rfl: 12 .  Lidocaine (ZTLIDO) 1.8 % PTCH, Apply 1 patch topically daily., Disp: 30 patch, Rfl: 0 .  lisinopril-hydrochlorothiazide (PRINZIDE,ZESTORETIC) 20-12.5 MG tablet, TAKE 1 TABLET BY MOUTH DAILY, Disp: 90 tablet, Rfl: 1 .  lovastatin (MEVACOR)  40 MG tablet, TAKE 1 TO 2 TABLETS(40 TO 80 MG) BY MOUTH DAILY, Disp: 180 tablet, Rfl: 0 .  methylPREDNISolone (MEDROL DOSEPAK) 4 MG TBPK tablet, Take as prescribed, Disp: 21 tablet, Rfl: 0 .  mirtazapine (REMERON) 30 MG tablet, Take 1 tablet (30 mg total) by mouth at bedtime., Disp: 90 tablet, Rfl: 0 .  pregabalin (LYRICA) 50 MG capsule, Take 50 mg by mouth 2 (two) times daily. , Disp: , Rfl:  .  trospium (SANCTURA) 20 MG tablet, Take 1 tablet (20 mg total) by mouth 2 (two) times daily. (Patient not taking: Reported on 03/09/2018), Disp: 60 tablet, Rfl: 2  EXAM:  GENERAL: alert.  Answering questions  appropriately.  Sounds go be in no acute distress.    PSYCH/NEURO: pleasant and cooperative, no obvious depression or anxiety, speech and thought processing grossly intact  ASSESSMENT AND PLAN:  Discussed the following assessment and plan:  Anxiety  Chronic kidney disease, stage III (moderate) (HCC)  Depression, unspecified depression type  Essential hypertension, benign  Hyperlipidemia, unspecified hyperlipidemia type  Spinal stenosis of lumbar region, unspecified whether neurogenic claudication present  Right knee pain, unspecified chronicity  Elevated hemoglobin ([place]), Chronic  Anxiety On clonazepam.  Stable.    Chronic kidney disease, stage III (moderate) Seeing nephrology - Dr Holley Raring.  Has renal ultrasound 08/02/18.  Avoid antiinflammatories.    Depression On remeron.  Stable.   Essential hypertension, benign Blood pressure has been doing well.  Continue current medication regimen.  Follow pressures.  Follow metabolic panel.    Hyperlipidemia On lovastatin.  Low cholesterol diet and exercise.  Follow lipid panel and liver function tests.    Lumbar spinal stenosis Followed by Dr Sharlet Salina.  Stable.    Right knee pain Stable.  Wears support sleeve.    Elevated hemoglobin (HCC) Recheck cbc.     I discussed the assessment and treatment plan with the patient. The patient was provided an opportunity to ask questions and all were answered. The patient agreed with the plan and demonstrated an understanding of the instructions.   The patient was advised to call back or seek an in-person evaluation if the symptoms worsen or if the condition fails to improve as anticipated.  I provided 15 minutes of non-face-to-face time during this encounter.   Einar Pheasant, MD

## 2018-07-23 ENCOUNTER — Encounter: Payer: Self-pay | Admitting: Internal Medicine

## 2018-07-23 DIAGNOSIS — D582 Other hemoglobinopathies: Secondary | ICD-10-CM | POA: Insufficient documentation

## 2018-07-23 NOTE — Assessment & Plan Note (Signed)
Stable.  Wears support sleeve.

## 2018-07-23 NOTE — Assessment & Plan Note (Signed)
Blood pressure has been doing well.  Continue current medication regimen.  Follow pressures.  Follow metabolic panel.  

## 2018-07-23 NOTE — Assessment & Plan Note (Signed)
On remeron.  Stable.

## 2018-07-23 NOTE — Assessment & Plan Note (Signed)
On clonazepam.  Stable.

## 2018-07-23 NOTE — Assessment & Plan Note (Signed)
Followed by Dr Sharlet Salina.  Stable.

## 2018-07-23 NOTE — Assessment & Plan Note (Signed)
On lovastatin.  Low cholesterol diet and exercise.  Follow lipid panel and liver function tests.   

## 2018-07-23 NOTE — Assessment & Plan Note (Signed)
Seeing nephrology - Dr Holley Raring.  Has renal ultrasound 08/02/18.  Avoid antiinflammatories.

## 2018-07-23 NOTE — Assessment & Plan Note (Signed)
Recheck cbc.  

## 2018-08-02 ENCOUNTER — Ambulatory Visit
Admission: RE | Admit: 2018-08-02 | Discharge: 2018-08-02 | Disposition: A | Discharge status: Home / Self Care | Payer: PPO | Source: Ambulatory Visit | Attending: Nephrology | Admitting: Nephrology

## 2018-08-02 ENCOUNTER — Other Ambulatory Visit: Payer: Self-pay

## 2018-08-02 DIAGNOSIS — N281 Cyst of kidney, acquired: Secondary | ICD-10-CM | POA: Diagnosis not present

## 2018-08-02 DIAGNOSIS — N183 Chronic kidney disease, stage 3 unspecified: Secondary | ICD-10-CM

## 2018-08-02 DIAGNOSIS — D1771 Benign lipomatous neoplasm of kidney: Secondary | ICD-10-CM | POA: Diagnosis not present

## 2018-08-10 DIAGNOSIS — I10 Essential (primary) hypertension: Secondary | ICD-10-CM | POA: Diagnosis not present

## 2018-08-10 DIAGNOSIS — N183 Chronic kidney disease, stage 3 (moderate): Secondary | ICD-10-CM | POA: Diagnosis not present

## 2018-08-12 ENCOUNTER — Other Ambulatory Visit: Payer: Self-pay | Admitting: Internal Medicine

## 2018-08-21 DIAGNOSIS — M5136 Other intervertebral disc degeneration, lumbar region: Secondary | ICD-10-CM | POA: Diagnosis not present

## 2018-08-21 DIAGNOSIS — M47816 Spondylosis without myelopathy or radiculopathy, lumbar region: Secondary | ICD-10-CM | POA: Diagnosis not present

## 2018-08-21 DIAGNOSIS — M5416 Radiculopathy, lumbar region: Secondary | ICD-10-CM | POA: Diagnosis not present

## 2018-08-21 DIAGNOSIS — M48062 Spinal stenosis, lumbar region with neurogenic claudication: Secondary | ICD-10-CM | POA: Diagnosis not present

## 2018-09-06 ENCOUNTER — Telehealth: Payer: Self-pay | Admitting: Internal Medicine

## 2018-09-06 MED ORDER — CLONAZEPAM 0.5 MG PO TABS: ORAL_TABLET | ORAL | 0 refills | Status: DC

## 2018-09-06 NOTE — Telephone Encounter (Signed)
Last OV:  07/20/18  Last Refill:  06/02/18

## 2018-09-06 NOTE — Telephone Encounter (Signed)
rx sent in for clonazepam #90 with no refills.  

## 2018-09-06 NOTE — Telephone Encounter (Signed)
Medication Refill - Medication: clonazePAM (KLONOPIN) 0.5 MG tablet  Has the patient contacted their pharmacy? Yes - told her she has no refills left (Agent: If no, request that the patient contact the pharmacy for the refill.) (Agent: If yes, when and what did the pharmacy advise?)  Preferred Pharmacy (with phone number or street name):  [id number] [address], [place] [address] [phone] (Phone) [phone] (Fax)     Agent: Please be advised that RX refills may take up to 3 business days. We ask that you follow-up with your pharmacy.

## 2018-09-14 DIAGNOSIS — M810 Age-related osteoporosis without current pathological fracture: Secondary | ICD-10-CM | POA: Diagnosis not present

## 2018-09-14 DIAGNOSIS — E559 Vitamin D deficiency, unspecified: Secondary | ICD-10-CM | POA: Diagnosis not present

## 2018-09-14 DIAGNOSIS — E213 Hyperparathyroidism, unspecified: Secondary | ICD-10-CM | POA: Diagnosis not present

## 2018-10-04 DIAGNOSIS — M5416 Radiculopathy, lumbar region: Secondary | ICD-10-CM | POA: Diagnosis not present

## 2018-10-04 DIAGNOSIS — M5136 Other intervertebral disc degeneration, lumbar region: Secondary | ICD-10-CM | POA: Diagnosis not present

## 2018-10-04 DIAGNOSIS — M8588 Other specified disorders of bone density and structure, other site: Secondary | ICD-10-CM | POA: Diagnosis not present

## 2018-10-04 DIAGNOSIS — M48062 Spinal stenosis, lumbar region with neurogenic claudication: Secondary | ICD-10-CM | POA: Diagnosis not present

## 2018-10-25 ENCOUNTER — Other Ambulatory Visit: Payer: Self-pay | Admitting: Internal Medicine

## 2018-10-25 NOTE — Telephone Encounter (Signed)
Patient requested a refill earlier today for Remeron and Mevacor.  Patient called back stating that the pharmacy told her that the Rx for Remeron has been denied.  Patient not sure why.  No refill for lovastatin has been sent yet.  Patient says that she only has 4 days left of Remeron.

## 2018-10-25 NOTE — Telephone Encounter (Unsigned)
Copied from [place] [id number]. Topic: Quick Communication - Rx Refill/Question >> Oct 25, 2018 10:33 AM Yvette Rack wrote: Medication: mirtazapine (REMERON) 30 MG tablet and lovastatin (MEVACOR) 40 MG tablet  Has the patient contacted their pharmacy? yes   Preferred Pharmacy (with phone number or street name): [facility] #[id number] - [address], [address] [phone] (Phone)  [phone] (Fax)  Agent: Please be advised that RX refills may take up to 3 business days. We ask that you follow-up with your pharmacy.

## 2018-10-27 ENCOUNTER — Other Ambulatory Visit: Payer: Self-pay

## 2018-10-27 MED ORDER — MIRTAZAPINE 30 MG PO TABS: 30.0000 mg | ORAL_TABLET | Freq: Every day | ORAL | 0 refills | Status: DC

## 2018-10-27 MED ORDER — LOVASTATIN 40 MG PO TABS: ORAL_TABLET | ORAL | 0 refills | Status: DC

## 2018-10-27 NOTE — Telephone Encounter (Signed)
Patient calling to check status of this requesting. Patient is out of both medications.

## 2018-10-31 ENCOUNTER — Other Ambulatory Visit: Payer: Self-pay

## 2018-10-31 ENCOUNTER — Ambulatory Visit (INDEPENDENT_AMBULATORY_CARE_PROVIDER_SITE_OTHER): Payer: PPO

## 2018-10-31 DIAGNOSIS — Z0000 Encounter for general adult medical examination without abnormal findings: Secondary | ICD-10-CM

## 2018-10-31 NOTE — Progress Notes (Signed)
Subjective:   Diana Lester is a 85 y.o. female who presents for Medicare Annual (Subsequent) preventive examination.  Review of Systems:  No ROS.  Medicare Wellness Virtual Visit.  Visual/audio telehealth visit, UTA vital signs.   See social history for additional risk factors.   Cardiac Risk Factors include: advanced age (>16men, >28 women)     Objective:     Vitals: There were no vitals taken for this visit.  There is no height or weight on file to calculate BMI.  Advanced Directives 10/31/2018 03/03/2018 01/27/2018 10/04/2017 06/20/2017 04/13/2017 03/10/2016  Does Patient Have a Medical Advance Directive? No No No Yes Yes No Yes  Type of Advance Directive - - Public librarian;Living will Healthcare Power of [place];Living will  Does patient want to make changes to medical advance directive? - - - No - Patient declined - - No - Patient declined  Copy of [place] in Chart? - - - No - copy requested - - No - copy requested  Would patient like information on creating a medical advance directive? No - Patient declined - - - - No - Patient declined -    Tobacco Social History   Tobacco Use  Smoking Status Former Smoker   Packs/day: 0.50   Years: 4.00   Pack years: 2.00   Types: Cigarettes   Quit date: 12/09/1984   Years since quitting: 33.9  Smokeless Tobacco Never Used  Tobacco Comment   quit 12/09/84     Counseling given: Not Answered Comment: quit 12/09/84   Clinical Intake:  Pre-visit preparation completed: Yes        Diabetes: No  How often do you need to have someone help you when you read instructions, pamphlets, or other written materials from your doctor or pharmacy?: 1 - Never  Interpreter Needed?: No     Past Medical History:  Diagnosis Date   Arthritis    Breast cancer ([place]) 1995   LT LUMPECTOMY & s/p XRT   Depression    Hx of colonic polyp    Hypercholesterolemia     Hypertension    Melanoma ([place])    Personal history of radiation therapy    Spinal stenosis    lumbar   Past Surgical History:  Procedure Laterality Date   BREAST BIOPSY Left 2006   EXCISIONAL - NEG   BREAST BIOPSY Left 1995   LT LUMPECTOMY FOR CA   BREAST LUMPECTOMY Left 1995   EYE SURGERY Bilateral    cataract excision   LAPAROSCOPIC APPENDECTOMY N/A 01/11/2016   Procedure: APPENDECTOMY LAPAROSCOPIC;  Surgeon: Dia Crawford III, MD;  Location: [facility] ORS;  Service: General;  Laterality: N/A;   Family History  Problem Relation Age of Onset   Arthritis Mother    Hyperlipidemia Mother    Hypertension Mother    Hyperlipidemia Sister    Kidney cancer Sister    Breast cancer Neg Hx    Social History   Socioeconomic History   Marital status: Widowed    Spouse name: Not on file   Number of children: 2   Years of education: Not on file   Highest education level: Not on file  Occupational History   Not on file  Social Needs   Financial resource strain: Not hard at all   Food insecurity    Worry: Never true    Inability: Never true   Transportation needs    Medical: No  Non-medical: No  Tobacco Use   Smoking status: Former Smoker    Packs/day: 0.50    Years: 4.00    Pack years: 2.00    Types: Cigarettes    Quit date: 12/09/1984    Years since quitting: 33.9   Smokeless tobacco: Never Used   Tobacco comment: quit 12/09/84  Substance and Sexual Activity   Alcohol use: No    Alcohol/week: 0.0 standard drinks    Comment: sparingly   Drug use: No   Sexual activity: Never  Lifestyle   Physical activity    Days per week: Not on file    Minutes per session: Not on file   Stress: Not at all  Relationships   Social connections    Talks on phone: Not on file    Gets together: Not on file    Attends religious service: Not on file    Active member of club or organization: Not on file    Attends meetings of clubs or organizations: Not on file     Relationship status: Not on file  Other Topics Concern   Not on file  Social History Narrative   Not on file    Outpatient Encounter Medications as of 10/31/2018  Medication Sig   aspirin 81 MG tablet Take 81 mg by mouth daily.   clonazePAM (KLONOPIN) 0.5 MG tablet TAKE 1 TABLET(0.5 MG) BY MOUTH DAILY AS NEEDED FOR ANXIETY   lisinopril-hydrochlorothiazide (ZESTORETIC) 20-12.5 MG tablet TAKE 1 TABLET BY MOUTH DAILY   lovastatin (MEVACOR) 40 MG tablet TAKE 1 TO 2 TABLETS(40 TO 80 MG) BY MOUTH DAILY   mirtazapine (REMERON) 30 MG tablet Take 1 tablet (30 mg total) by mouth at bedtime.   Multiple Vitamin (MULTIVITAMIN) tablet Take 1 tablet by mouth daily.   pregabalin (LYRICA) 50 MG capsule Take 50 mg by mouth 2 (two) times daily.    [DISCONTINUED] Calcium Carb-Cholecalciferol (CALCIUM-VITAMIN D) 500-200 MG-UNIT tablet Take 2 tablets by mouth.    [DISCONTINUED] conjugated estrogens (PREMARIN) vaginal cream Apply 1 pea sized amount three times weekly   [DISCONTINUED] Lidocaine (ZTLIDO) 1.8 % PTCH Apply 1 patch topically daily.   [DISCONTINUED] methylPREDNISolone (MEDROL DOSEPAK) 4 MG TBPK tablet Take as prescribed   [DISCONTINUED] trospium (SANCTURA) 20 MG tablet Take 1 tablet (20 mg total) by mouth 2 (two) times daily. (Patient not taking: Reported on 03/09/2018)   No facility-administered encounter medications on file as of 10/31/2018.     Activities of Daily Living In your present state of health, do you have any difficulty performing the following activities: 10/31/2018  Hearing? N  Vision? N  Difficulty concentrating or making decisions? Y  Comment Some difficulty remembering short term  Walking or climbing stairs? N  Dressing or bathing? N  Doing errands, shopping? N  Preparing Food and eating ? N  Using the Toilet? N  In the past six months, have you accidently leaked urine? Y  Comment Managed with daily liner  Do you have problems with loss of bowel control? N    Managing your Medications? N  Managing your Finances? N  Housekeeping or managing your Housekeeping? N  Some recent data might be hidden    Patient Care Team: Einar Pheasant, MD as PCP - General (Internal Medicine)    Assessment:   This is a routine wellness examination for Diana Lester.  I connected with patient 10/31/18 at 11:30 AM EDT by an audio enabled telemedicine application and verified that I am speaking with the correct person using two  identifiers. Patient stated full name and DOB. Patient gave permission to continue with virtual visit. Patient's location was at home and Nurse's location was at [place] office.   Health Maintenance Due: Influenza vaccine 2020- discussed; to be completed in season with doctor or local pharmacy.   Dexa Scan- discussed; deferred for follow up with pcp Update all pending maintenance due as appropriate.   See completed HM at the end of note.   Eye: Visual acuity not assessed. Virtual visit. Wears corrective lenses. Followed by their ophthalmologist every 12 months.   Dental: Visits every 6 months.    Hearing: Demonstrates normal hearing during visit.  Safety:  Patient feels safe at home- yes Patient does have smoke detectors at home- yes Patient does wear sunscreen or protective clothing when in direct sunlight - yes Patient does wear seat belt when in a moving vehicle - yes Patient drives- yes Adequate lighting in walkways free from debris- yes Grab bars and handrails used as appropriate- yes  Social: Alcohol intake - sparingly  Smoking history- former  Smokers in home? none Illicit drug use? none  Depression: PHQ 2 &9 complete. See screening below. Denies irritability, anhedonia, sadness/tearfullness.  Stable.   Falls: See screening below.    Medication: Taking as directed and without issues. Takes aspirin on occasion as needed.  Covid-19: Precautions and sickness symptoms discussed. Wears mask, social distancing, hand  hygiene as appropriate.   Activities of Daily Living Patient denies needing assistance with: household chores, feeding themselves, getting from bed to chair, getting to the toilet, bathing/showering, dressing, managing money, or preparing meals.   Memory: Patient is alert. Patient denies difficulty focusing or concentrating. Correctly identified the president of the [place] and season. Recall 1/5. Patient has no specific thing she likes to do for brain stimulation. Encouraged reading, painting, learning a new hobby etc.. to promote healthy brain stimulation.  BMI- discussed the importance of a healthy diet, water intake and the benefits of aerobic exercise.  Educational material provided.  Physical activity- no routine. Encouraged walking as often as possible; stay active.   Diet: healthy Water: good intake  Advanced Directive: End of life planning; Advanced aging; Advanced directives discussed.  No HCPOA/Living Will.  Additional information declined at this time.  Other Providers Patient Care Team: Einar Pheasant, MD as PCP - General (Internal Medicine)  Exercise Activities and Dietary recommendations Current Exercise Habits: Home exercise routine, Type of exercise: walking, Intensity: Mild  Goals     Maintain Healthy Lifestyle      Walk for exercise as tolerated Stay hydrated Healthy diet       Fall Risk Fall Risk  10/31/2018 10/04/2017 03/10/2016 12/22/2015 11/18/2015  Falls in the past year? 0 No No No No   Timed Get Up and Go performed: no, virtual visit  Depression Screen PHQ 2/9 Scores 10/31/2018 10/04/2017 07/01/2016 03/10/2016  PHQ - 2 Score 0 0 1 0  PHQ- 9 Score - - 1 -     Cognitive Function MMSE - Mini Mental State Exam 10/04/2017 03/10/2016 03/11/2015  Orientation to time 5 5 5   Orientation to Place 5 5 5   Registration 3 3 3   Attention/ Calculation 5 5 5   Recall 1 3 3   Language- name 2 objects 2 2 2   Language- repeat 1 1 1   Language- follow 3 step command 2 3 3    Language- read & follow direction 1 1 1   Write a sentence 1 1 1   Copy design 1 1 1   Total score  27 30 30      6CIT Screen 10/31/2018  What Year? 0 points  What month? 0 points  What time? 0 points  Count back from 20 0 points  Months in reverse 0 points  Repeat phrase 8 points  Total Score 8    Immunization History  Administered Date(s) Administered   DTaP 08/31/2011   Influenza Split 12/11/2012, 12/13/2013   Influenza, High Dose Seasonal PF 11/18/2015, 04/02/2017   Influenza, Seasonal, Injecte, Preservative Fre 12/05/2007   Influenza,inj,Quad PF,6+ Mos 11/06/2014   Pneumococcal Conjugate-13 12/22/2015   Pneumococcal Polysaccharide-23 05/23/2017   Tdap 08/31/2011   Zoster 01/29/2011   Screening Tests Health Maintenance  Topic Date Due   INFLUENZA VACCINE  06/06/2019 (Originally 10/07/2018)   DEXA SCAN  10/31/2019 (Originally 01/31/1998)   MAMMOGRAM  12/20/2018   TETANUS/TDAP  08/30/2021   PNA vac Low Risk Adult  Completed      Plan:    Keep all routine maintenance appointments.   Follow up 11/07/18 at 10:30 with pcp  Medicare Attestation I have personally reviewed: The patient's medical and social history Their use of alcohol, tobacco or illicit drugs Their current medications and supplements The patient's functional ability including ADLs,fall risks, home safety risks, cognitive, and hearing and visual impairment Diet and physical activities Evidence for depression   In addition, I have reviewed and discussed with patient certain preventive protocols, quality metrics, and best practice recommendations. A written personalized care plan for preventive services as well as general preventive health recommendations were provided to patient.     Varney Biles, LPN  [id number]/[id number]   Reviewed above information.  Agree with assessment and plan.    Dr Nicki Reaper

## 2018-10-31 NOTE — Patient Instructions (Addendum)
  Ms. Osentoski , Thank you for taking time to come for your Medicare Wellness Visit. I appreciate your ongoing commitment to your health goals. Please review the following plan we discussed and let me know if I can assist you in the future.   These are the goals we discussed: Goals    . Maintain Healthy Lifestyle      Walk for exercise as tolerated Stay hydrated Healthy diet       This is a list of the screening recommended for you and due dates:  Health Maintenance  Topic Date Due  . Flu Shot  06/06/2019*  . DEXA scan (bone density measurement)  10/31/2019*  . Mammogram  12/20/2018  . Tetanus Vaccine  08/30/2021  . Pneumonia vaccines  Completed  *Topic was postponed. The date shown is not the original due date.

## 2018-11-06 DIAGNOSIS — M48062 Spinal stenosis, lumbar region with neurogenic claudication: Secondary | ICD-10-CM | POA: Diagnosis not present

## 2018-11-06 DIAGNOSIS — M47816 Spondylosis without myelopathy or radiculopathy, lumbar region: Secondary | ICD-10-CM | POA: Diagnosis not present

## 2018-11-06 DIAGNOSIS — M5416 Radiculopathy, lumbar region: Secondary | ICD-10-CM | POA: Diagnosis not present

## 2018-11-06 DIAGNOSIS — M5136 Other intervertebral disc degeneration, lumbar region: Secondary | ICD-10-CM | POA: Diagnosis not present

## 2018-11-07 ENCOUNTER — Other Ambulatory Visit: Payer: Self-pay

## 2018-11-07 ENCOUNTER — Ambulatory Visit (INDEPENDENT_AMBULATORY_CARE_PROVIDER_SITE_OTHER): Payer: PPO | Admitting: Internal Medicine

## 2018-11-07 ENCOUNTER — Ambulatory Visit: Payer: PPO

## 2018-11-07 DIAGNOSIS — R739 Hyperglycemia, unspecified: Secondary | ICD-10-CM

## 2018-11-07 DIAGNOSIS — Z1231 Encounter for screening mammogram for malignant neoplasm of breast: Secondary | ICD-10-CM | POA: Diagnosis not present

## 2018-11-07 DIAGNOSIS — F419 Anxiety disorder, unspecified: Secondary | ICD-10-CM

## 2018-11-07 DIAGNOSIS — D582 Other hemoglobinopathies: Secondary | ICD-10-CM | POA: Diagnosis not present

## 2018-11-07 DIAGNOSIS — F32A Depression, unspecified: Secondary | ICD-10-CM

## 2018-11-07 DIAGNOSIS — I10 Essential (primary) hypertension: Secondary | ICD-10-CM | POA: Diagnosis not present

## 2018-11-07 DIAGNOSIS — F320 Major depressive disorder, single episode, mild: Secondary | ICD-10-CM | POA: Diagnosis not present

## 2018-11-07 DIAGNOSIS — M48061 Spinal stenosis, lumbar region without neurogenic claudication: Secondary | ICD-10-CM

## 2018-11-07 DIAGNOSIS — E785 Hyperlipidemia, unspecified: Secondary | ICD-10-CM

## 2018-11-07 DIAGNOSIS — N183 Chronic kidney disease, stage 3 unspecified: Secondary | ICD-10-CM

## 2018-11-07 NOTE — Progress Notes (Signed)
Patient ID: [name], female   DOB: 1930-05-23, 85 y.o.   MRN: [id number]   Virtual Visit via Telephone Note  This visit type was conducted due to national recommendations for restrictions regarding the COVID-19 pandemic (e.g. social distancing).  This format is felt to be most appropriate for this patient at this time.  All issues noted in this document were discussed and addressed.  No physical exam was performed (except for noted visual exam findings with Video Visits).   I connected with Diana Lester by telephone and verified that I am speaking with the correct person using two identifiers. Location patient: home Location provider: work  Persons participating in the telephone visit: patient, provider  I discussed the limitations, risks, security and privacy concerns of performing an evaluation and management service by telephone and the availability of in person appointments. The patient expressed understanding and agreed to proceed.   Reason for visit: scheduled follow up.     HPI: She reports she is doing relatively well.  Tries to stay active.  No chest pain.  No sob.  No acid reflux.  No abdominal pain.  Bowels moving.  Problems remembering.  States she writes a lot of notes.  Discussed further evaluation and w/up.  Discussed treatment options.  She declines further evaluation and treatment.  Saw Dr Sharlet Salina 10/04/18.  Back stable.  Taking lovastatin.  Handling stress. Does feel needs the clonazepam.     ROS: See pertinent positives and negatives per HPI.  Past Medical History:  Diagnosis Date  . Arthritis   . Breast cancer ([place]) 1995   LT LUMPECTOMY & s/p XRT  . Depression   . Hx of colonic polyp   . Hypercholesterolemia   . Hypertension   . Melanoma ([place])   . Personal history of radiation therapy   . Spinal stenosis    lumbar    Past Surgical History:  Procedure Laterality Date  . BREAST BIOPSY Left 2006   EXCISIONAL - NEG  . BREAST BIOPSY Left 1995   LT  LUMPECTOMY FOR CA  . BREAST LUMPECTOMY Left 1995  . EYE SURGERY Bilateral    cataract excision  . LAPAROSCOPIC APPENDECTOMY N/A 01/11/2016   Procedure: APPENDECTOMY LAPAROSCOPIC;  Surgeon: Dia Crawford III, MD;  Location: [facility] ORS;  Service: General;  Laterality: N/A;    Family History  Problem Relation Age of Onset  . Arthritis Mother   . Hyperlipidemia Mother   . Hypertension Mother   . Hyperlipidemia Sister   . Kidney cancer Sister   . Breast cancer Neg Hx     SOCIAL HX: reviewed.    Current Outpatient Medications:  .  clonazePAM (KLONOPIN) 0.5 MG tablet, TAKE 1 TABLET(0.5 MG) BY MOUTH DAILY AS NEEDED FOR ANXIETY, Disp: 90 tablet, Rfl: 0 .  lisinopril-hydrochlorothiazide (ZESTORETIC) 20-12.5 MG tablet, TAKE 1 TABLET BY MOUTH DAILY, Disp: 90 tablet, Rfl: 1 .  lovastatin (MEVACOR) 40 MG tablet, TAKE 1 TO 2 TABLETS(40 TO 80 MG) BY MOUTH DAILY, Disp: 180 tablet, Rfl: 0 .  mirtazapine (REMERON) 30 MG tablet, Take 1 tablet (30 mg total) by mouth at bedtime., Disp: 90 tablet, Rfl: 0 .  Multiple Vitamin (MULTIVITAMIN) tablet, Take 1 tablet by mouth daily., Disp: , Rfl:  .  pregabalin (LYRICA) 50 MG capsule, Take 50 mg by mouth 2 (two) times daily. , Disp: , Rfl:   EXAM:  GENERAL: alert.  Sounds to be in no acute distress. Answering questions appropriately.    PSYCH/NEURO: pleasant and cooperative,  no obvious depression or anxiety, speech and thought processing grossly intact  ASSESSMENT AND PLAN:  Discussed the following assessment and plan:  Anxiety On clonazepam.  Stable.    Chronic kidney disease, stage III (moderate) Followed by nephrology - Dr Holley Raring.  Avoid antiinflammatories.    Elevated hemoglobin (HCC) Recheck cbc.   Essential hypertension, benign Blood pressure has been under good control.  Continue same medication regimen.  Follow pressures.  Follow metabolic panel.    Hyperlipidemia On lovastatin.  Follow lipid panel and liver function tests.   Lumbar spinal  stenosis Followed by Dr Sharlet Salina.    Mild depression (HCC) Stable. On remeron.     I discussed the assessment and treatment plan with the patient. The patient was provided an opportunity to ask questions and all were answered. The patient agreed with the plan and demonstrated an understanding of the instructions.   The patient was advised to call back or seek an in-person evaluation if the symptoms worsen or if the condition fails to improve as anticipated.  I provided 16 minutes of non-face-to-face time during this encounter.   Diana Pheasant, MD

## 2018-11-12 ENCOUNTER — Encounter: Payer: Self-pay | Admitting: Internal Medicine

## 2018-11-12 NOTE — Assessment & Plan Note (Signed)
Followed by Dr Chasnis.   

## 2018-11-12 NOTE — Assessment & Plan Note (Signed)
Stable. On remeron.

## 2018-11-12 NOTE — Assessment & Plan Note (Signed)
On clonazepam.  Stable.   

## 2018-11-12 NOTE — Assessment & Plan Note (Signed)
Followed by nephrology - Dr Holley Raring.  Avoid antiinflammatories.

## 2018-11-12 NOTE — Assessment & Plan Note (Signed)
On lovastatin.  Follow lipid panel and liver function tests.   

## 2018-11-12 NOTE — Assessment & Plan Note (Signed)
Recheck cbc.  

## 2018-11-12 NOTE — Assessment & Plan Note (Signed)
Blood pressure has been under good control.  Continue same medication regimen.  Follow pressures.  Follow metabolic panel.   

## 2018-11-20 ENCOUNTER — Telehealth: Payer: Self-pay

## 2018-11-20 NOTE — Telephone Encounter (Signed)
Copied from [place] [id number]. Topic: General - Other >> Nov 20, 2018 10:41 AM Pauline Good wrote: Reason for CRM: pt's son is calling and need call back from nurse to discuss the pt's meds and make sure she is taking what she is suppose to

## 2018-11-21 ENCOUNTER — Other Ambulatory Visit: Payer: Self-pay

## 2018-11-21 NOTE — Telephone Encounter (Signed)
meds clarified and discussed with son.

## 2018-11-27 ENCOUNTER — Encounter: Payer: Self-pay | Admitting: Internal Medicine

## 2018-11-27 NOTE — Telephone Encounter (Signed)
I do not see anything prior to that date unless you do a 12 or 4:30

## 2018-11-27 NOTE — Telephone Encounter (Signed)
See if they can come in 12/06/18 at 12:00.  Let me know if needs earlier appt.

## 2018-11-30 ENCOUNTER — Ambulatory Visit (INDEPENDENT_AMBULATORY_CARE_PROVIDER_SITE_OTHER): Payer: PPO | Admitting: Internal Medicine

## 2018-11-30 ENCOUNTER — Other Ambulatory Visit: Payer: Self-pay

## 2018-11-30 VITALS — BP 110/62 | HR 91 | Temp 97.9°F | Resp 16 | Wt 126.2 lb

## 2018-11-30 DIAGNOSIS — R739 Hyperglycemia, unspecified: Secondary | ICD-10-CM | POA: Diagnosis not present

## 2018-11-30 DIAGNOSIS — F419 Anxiety disorder, unspecified: Secondary | ICD-10-CM

## 2018-11-30 DIAGNOSIS — N183 Chronic kidney disease, stage 3 unspecified: Secondary | ICD-10-CM

## 2018-11-30 DIAGNOSIS — F320 Major depressive disorder, single episode, mild: Secondary | ICD-10-CM

## 2018-11-30 DIAGNOSIS — F32A Depression, unspecified: Secondary | ICD-10-CM

## 2018-11-30 DIAGNOSIS — R634 Abnormal weight loss: Secondary | ICD-10-CM | POA: Diagnosis not present

## 2018-11-30 DIAGNOSIS — R4182 Altered mental status, unspecified: Secondary | ICD-10-CM

## 2018-11-30 DIAGNOSIS — I10 Essential (primary) hypertension: Secondary | ICD-10-CM

## 2018-11-30 DIAGNOSIS — R413 Other amnesia: Secondary | ICD-10-CM | POA: Diagnosis not present

## 2018-11-30 DIAGNOSIS — Z23 Encounter for immunization: Secondary | ICD-10-CM

## 2018-11-30 DIAGNOSIS — E785 Hyperlipidemia, unspecified: Secondary | ICD-10-CM

## 2018-11-30 DIAGNOSIS — R2681 Unsteadiness on feet: Secondary | ICD-10-CM

## 2018-11-30 NOTE — Progress Notes (Signed)
[name], female   DOB: [birth year], 85 y.o.   MRN: [id number]   Subjective:    [name], female    DOB: 1931-02-13, 85 y.o.   MRN: [id number]  HPI  Patient here as a work in appt.  She is accompanied by her son [name]) and daughter-n-law.  History obtained from all of them.  Family concerned regarding increased confusion and decline in memory.  Has had issues with cognitive impairment, but they have noticed recently an increased problem.  Worsened over the last 4-6 weeks.  She is not taking her medication properly.  They have tried pill boxes and alarms. Have also called her daily to remind her to take her medication.  This does not appear to be working now.  She is eating.  No chest pain.  No nausea or vomiting.  Not sure how well she is eating.  No abdominal pain.  Bowels moving.  Lives by herself.  Son checks on her.  Has a sister in town.  Discussed in home care.  Discussed assisted living.  She does not feel she needs any further assistance.  Family agrees she needs some intervention.  She did fall recently.  Denies head injury, headache.  Has old lesion on her leg.  Resolving hematoma on her elbow.  No headache.  No dizziness.  Has seen neurology previously.  Has declined further w/up, testing and treatment for her memory.     Past Medical History:  Diagnosis Date  . Arthritis   . Breast cancer ([place]) 1995   LT LUMPECTOMY & s/p XRT  . Depression   . Hx of colonic polyp   . Hypercholesterolemia   . Hypertension   . Melanoma ([place])   . Personal history of radiation therapy   . Spinal stenosis    lumbar   Past Surgical History:  Procedure Laterality Date  . BREAST BIOPSY Left 2006   EXCISIONAL - NEG  . BREAST BIOPSY Left 1995   LT LUMPECTOMY FOR CA  . BREAST LUMPECTOMY Left 1995  . EYE SURGERY Bilateral    cataract excision  . LAPAROSCOPIC APPENDECTOMY N/A 01/11/2016   Procedure: APPENDECTOMY LAPAROSCOPIC;  Surgeon: Dia Crawford III, MD;  Location:  [facility] ORS;  Service: General;  Laterality: N/A;   Family History  Problem Relation Age of Onset  . Arthritis Mother   . Hyperlipidemia Mother   . Hypertension Mother   . Hyperlipidemia Sister   . Kidney cancer Sister   . Breast cancer Neg Hx    Social History   Socioeconomic History  . Marital status: Widowed    Spouse name: Not on file  . Number of children: 2  . Years of education: Not on file  . Highest education level: Not on file  Occupational History  . Not on file  Social Needs  . Financial resource strain: Not hard at all  . Food insecurity    Worry: Never true    Inability: Never true  . Transportation needs    Medical: No    Non-medical: No  Tobacco Use  . Smoking status: Former Smoker    Packs/day: 0.50    Years: 4.00    Pack years: 2.00    Types: Cigarettes    Quit date: 12/09/1984    Years since quitting: 34.0  . Smokeless tobacco: Never Used  . Tobacco comment: quit 12/09/84  Substance and Sexual Activity  . Alcohol use: No    Alcohol/week: 0.0 standard drinks  Comment: sparingly  . Drug use: No  . Sexual activity: Never  Lifestyle  . Physical activity    Days per week: Not on file    Minutes per session: Not on file  . Stress: Not at all  Relationships  . Social Herbalist on phone: Not on file    Gets together: Not on file    Attends religious service: Not on file    Active member of club or organization: Not on file    Attends meetings of clubs or organizations: Not on file    Relationship status: Not on file  Other Topics Concern  . Not on file  Social History Narrative  . Not on file    Outpatient Encounter Medications as of 11/30/2018  Medication Sig  . clonazePAM (KLONOPIN) 0.5 MG tablet TAKE 1 TABLET(0.5 MG) BY MOUTH DAILY AS NEEDED FOR ANXIETY  . lisinopril-hydrochlorothiazide (ZESTORETIC) 20-12.5 MG tablet TAKE 1 TABLET BY MOUTH DAILY  . lovastatin (MEVACOR) 40 MG tablet TAKE 1 TO 2 TABLETS(40 TO 80 MG) BY MOUTH DAILY   . mirtazapine (REMERON) 30 MG tablet Take 1 tablet (30 mg total) by mouth at bedtime.  . Multiple Vitamin (MULTIVITAMIN) tablet Take 1 tablet by mouth daily.  . pregabalin (LYRICA) 50 MG capsule Take 50 mg by mouth 2 (two) times daily.   . traMADol (ULTRAM) 50 MG tablet Take 50 mg by mouth 2 (two) times daily.   No facility-administered encounter medications on file as of 11/30/2018.     Review of Systems  Constitutional: Negative for fever.       Pt denies appetite change.   HENT: Negative for congestion and sinus pressure.   Respiratory: Negative for chest tightness and shortness of breath.   Cardiovascular: Negative for chest pain, palpitations and leg swelling.  Gastrointestinal: Negative for abdominal pain, diarrhea, nausea and vomiting.  Genitourinary: Negative for difficulty urinating and dysuria.  Musculoskeletal: Negative for joint swelling and myalgias.       Chronic back pain.   Skin: Negative for color change and rash.  Neurological: Negative for dizziness, light-headedness and headaches.  Psychiatric/Behavioral: Negative for agitation and dysphoric mood.       Objective:    Physical Exam Constitutional:      General: She is not in acute distress.    Appearance: Normal appearance.  HENT:     Head: Normocephalic.  Eyes:     General: No scleral icterus.       Right eye: No discharge.        Left eye: No discharge.     Conjunctiva/sclera: Conjunctivae normal.  Neck:     Musculoskeletal: Neck supple. No muscular tenderness.     Thyroid: No thyromegaly.  Cardiovascular:     Rate and Rhythm: Normal rate and regular rhythm.  Pulmonary:     Effort: No respiratory distress.     Breath sounds: Normal breath sounds. No wheezing.  Abdominal:     General: Bowel sounds are normal.     Palpations: Abdomen is soft.     Tenderness: There is no abdominal tenderness.  Musculoskeletal:        General: No swelling or tenderness.  Lymphadenopathy:     Cervical: No cervical  adenopathy.  Skin:    Findings: No erythema or rash.     Comments: Healing lesions lower left leg.  Resolving hematoma - left elbow.   Neurological:     Mental Status: She is alert.  Psychiatric:  Mood and Affect: Mood normal.        Behavior: Behavior normal.     BP 110/62   Pulse 91   Temp 97.9 F (36.6 C)   Resp 16   Wt 126 lb 3.2 oz (57.2 kg)   SpO2 93%   BMI 22.36 kg/m  Wt Readings from Last 3 Encounters:  11/30/18 126 lb 3.2 oz (57.2 kg)  03/09/18 138 lb 3.2 oz (62.7 kg)  03/03/18 130 lb (59 kg)     Lab Results  Component Value Date   WBC 14.3 (H) 11/30/2018   HGB 14.0 11/30/2018   HCT 43.2 11/30/2018   PLT 229.0 11/30/2018   GLUCOSE 109 (H) 11/30/2018   CHOL 170 11/30/2018   TRIG 171.0 (H) 11/30/2018   HDL 45.60 11/30/2018   LDLDIRECT 126.7 04/17/2013   LDLCALC 90 11/30/2018   ALT 20 11/30/2018   AST 34 11/30/2018   NA 137 11/30/2018   K 4.4 11/30/2018   CL 96 11/30/2018   CREATININE 2.50 (H) 11/30/2018   BUN 41 (H) 11/30/2018   CO2 26 11/30/2018   TSH 2.10 03/09/2018   HGBA1C 6.2 11/30/2018    US Renal  Result Date: 08/02/2018 CLINICAL DATA:  Chronic renal insufficiency EXAM: RENAL / URINARY TRACT ULTRASOUND COMPLETE COMPARISON:  CT dated 01/10/2016 FINDINGS: Right Kidney: Renal measurements: 8.5 x 2.6 x 3.3 cm = volume: 38.2 mL. A small 0.6 cm cyst is noted. There is no hydronephrosis. There is a small 0.9 cm hyperechoic nodule which is favored to represent a subcentimeter angiomyolipoma. Left Kidney: Renal measurements: 8.8 x 4.4 x 4.2 cm = volume: 85 mL. Echogenicity within normal limits. No mass or hydronephrosis visualized. Bladder: Appears normal for degree of bladder distention. Both ureteral jets are visualized. Multiple hyperechoic masses are again visualized throughout the liver measuring up to approximately 2.7 cm. These were visualized on prior CT. IMPRESSION: 1. No hydronephrosis. 2. Small subcentimeter angiomyolipoma involving the  upper pole the right kidney. 3. Both ureteral jets were visualized. 4. Hyperechoic masses are noted in the liver. These were visualized on prior CT from 2017. While they are favored to represent benign hepatic hemangiomas, they are incompletely characterized on this exam. Correlation with outside prior multiphase CT or MRI is recommended. If this has not already been performed, a liver mass protocol CT or MRI should be completed. Electronically Signed   By: Constance Holster M.D.   On: 08/02/2018 20:54       Assessment & Plan:   Problem List Items Addressed This Visit    Anxiety    Takes clonazepam one daily.  remeron at night.  This regimen has worked well for her.  Have tried to decrease clonazepam.  Anxiety increased significantly.  Discussed with family.  Will continue current medication regimen.        Chronic kidney disease, stage III (moderate) (HCC)    Is followed by Dr Holley Raring.  Given pt decline, will recheck metabolic panel to confirm if kidney function stable.        Essential hypertension, benign    Blood pressure under good control.  Continue same medication regimen.  Follow pressures.  Follow metabolic panel.        Hyperlipidemia   Loss of weight    Has lost weight.  Not sure how much she is eating.  Weight from 03/03/18 (130 pounds - 138 pounds documented last check).  Discussed with family.  Meals on wheels.  Discussed sitters and assisted living.  Memory change    Memory change - worsening.  Worsening dementia.  Discussed with family and patient.  Discussed further w/up.  Will start with labs as outlined.  Check cbc, metabolic panel, thyroid, etc.  Also check urine to confirm no infection.  Discussed help in home.  Discussed my concerns regarding inability to take her medications as directed.  Discussed safety concerns.  Discussed assisted living.  Family plans to look in to options.        Mild depression ([place])    Has been stable on remeron.  Follow.        Unsteady gait    Some unsteadiness noted on exam.  Discussed home health - PT.  Check labs as outlined.  Need assistance with medication to confirm taking correctly and not skipping and not doubling up.         Other Visit Diagnoses    Altered mental status, unspecified altered mental status type    -  Primary   Relevant Orders   Urine Culture   Urinalysis, Routine w reflex microscopic   Hyperglycemia          I spent over 45 minutes with the patient and her family.  More than 50% of the time was spent in consultation regarding the above.  Time spent discussing current concerns and home situation, memory issues, etc.  Time also spent discussing treatment options, further evaluation and testing.    Einar Pheasant, MD

## 2018-11-30 NOTE — Telephone Encounter (Signed)
Pt on schedule for 3:30 today

## 2018-12-01 ENCOUNTER — Emergency Department: Payer: PPO

## 2018-12-01 ENCOUNTER — Encounter: Payer: Self-pay | Admitting: Internal Medicine

## 2018-12-01 ENCOUNTER — Other Ambulatory Visit: Payer: Self-pay

## 2018-12-01 ENCOUNTER — Inpatient Hospital Stay
Admission: EM | Admit: 2018-12-01 | Discharge: 2018-12-03 | DRG: 683 | Disposition: A | Discharge status: Home Health | Payer: PPO | Attending: Internal Medicine | Admitting: Internal Medicine

## 2018-12-01 DIAGNOSIS — M6282 Rhabdomyolysis: Secondary | ICD-10-CM | POA: Diagnosis present

## 2018-12-01 DIAGNOSIS — I129 Hypertensive chronic kidney disease with stage 1 through stage 4 chronic kidney disease, or unspecified chronic kidney disease: Secondary | ICD-10-CM | POA: Diagnosis present

## 2018-12-01 DIAGNOSIS — E7800 Pure hypercholesterolemia, unspecified: Secondary | ICD-10-CM | POA: Diagnosis present

## 2018-12-01 DIAGNOSIS — Z91041 Radiographic dye allergy status: Secondary | ICD-10-CM | POA: Diagnosis not present

## 2018-12-01 DIAGNOSIS — Z853 Personal history of malignant neoplasm of breast: Secondary | ICD-10-CM

## 2018-12-01 DIAGNOSIS — Y92002 Bathroom of unspecified non-institutional (private) residence single-family (private) house as the place of occurrence of the external cause: Secondary | ICD-10-CM | POA: Diagnosis not present

## 2018-12-01 DIAGNOSIS — N179 Acute kidney failure, unspecified: Secondary | ICD-10-CM | POA: Diagnosis not present

## 2018-12-01 DIAGNOSIS — S80812A Abrasion, left lower leg, initial encounter: Secondary | ICD-10-CM | POA: Diagnosis present

## 2018-12-01 DIAGNOSIS — Z923 Personal history of irradiation: Secondary | ICD-10-CM

## 2018-12-01 DIAGNOSIS — R509 Fever, unspecified: Secondary | ICD-10-CM | POA: Diagnosis not present

## 2018-12-01 DIAGNOSIS — Z85820 Personal history of malignant melanoma of skin: Secondary | ICD-10-CM | POA: Diagnosis not present

## 2018-12-01 DIAGNOSIS — S0990XA Unspecified injury of head, initial encounter: Secondary | ICD-10-CM | POA: Diagnosis not present

## 2018-12-01 DIAGNOSIS — N183 Chronic kidney disease, stage 3 (moderate): Secondary | ICD-10-CM | POA: Diagnosis present

## 2018-12-01 DIAGNOSIS — S0001XA Abrasion of scalp, initial encounter: Secondary | ICD-10-CM | POA: Diagnosis present

## 2018-12-01 DIAGNOSIS — R2681 Unsteadiness on feet: Secondary | ICD-10-CM | POA: Insufficient documentation

## 2018-12-01 DIAGNOSIS — Z8349 Family history of other endocrine, nutritional and metabolic diseases: Secondary | ICD-10-CM | POA: Diagnosis not present

## 2018-12-01 DIAGNOSIS — Z86010 Personal history of colonic polyps: Secondary | ICD-10-CM

## 2018-12-01 DIAGNOSIS — Z20828 Contact with and (suspected) exposure to other viral communicable diseases: Secondary | ICD-10-CM | POA: Diagnosis present

## 2018-12-01 DIAGNOSIS — A419 Sepsis, unspecified organism: Secondary | ICD-10-CM | POA: Diagnosis not present

## 2018-12-01 DIAGNOSIS — I10 Essential (primary) hypertension: Secondary | ICD-10-CM | POA: Diagnosis not present

## 2018-12-01 DIAGNOSIS — Z87891 Personal history of nicotine dependence: Secondary | ICD-10-CM | POA: Diagnosis not present

## 2018-12-01 DIAGNOSIS — F419 Anxiety disorder, unspecified: Secondary | ICD-10-CM | POA: Diagnosis present

## 2018-12-01 DIAGNOSIS — E785 Hyperlipidemia, unspecified: Secondary | ICD-10-CM | POA: Diagnosis not present

## 2018-12-01 DIAGNOSIS — R4182 Altered mental status, unspecified: Secondary | ICD-10-CM | POA: Diagnosis present

## 2018-12-01 DIAGNOSIS — Z79899 Other long term (current) drug therapy: Secondary | ICD-10-CM

## 2018-12-01 DIAGNOSIS — F0390 Unspecified dementia without behavioral disturbance: Secondary | ICD-10-CM | POA: Diagnosis not present

## 2018-12-01 DIAGNOSIS — F329 Major depressive disorder, single episode, unspecified: Secondary | ICD-10-CM | POA: Diagnosis not present

## 2018-12-01 DIAGNOSIS — R413 Other amnesia: Secondary | ICD-10-CM | POA: Insufficient documentation

## 2018-12-01 DIAGNOSIS — Y92009 Unspecified place in unspecified non-institutional (private) residence as the place of occurrence of the external cause: Secondary | ICD-10-CM | POA: Diagnosis not present

## 2018-12-01 DIAGNOSIS — Z8249 Family history of ischemic heart disease and other diseases of the circulatory system: Secondary | ICD-10-CM | POA: Diagnosis not present

## 2018-12-01 DIAGNOSIS — R531 Weakness: Secondary | ICD-10-CM | POA: Diagnosis not present

## 2018-12-01 LAB — CBC
HCT: 42.8 % (ref 36.0–46.0)
Hemoglobin: 13.9 g/dL (ref 12.0–15.0)
MCH: 29 pg (ref 26.0–34.0)
MCHC: 32.5 g/dL (ref 30.0–36.0)
MCV: 89.2 fL (ref 80.0–100.0)
Platelets: 239 10*3/uL (ref 150–400)
RBC: 4.8 MIL/uL (ref 3.87–5.11)
RDW: 13.2 % (ref 11.5–15.5)
WBC: 15.9 10*3/uL — ABNORMAL HIGH (ref 4.0–10.5)
nRBC: 0 % (ref 0.0–0.2)

## 2018-12-01 LAB — CBC WITH DIFFERENTIAL/PLATELET
Basophils Absolute: 0.2 10*3/uL — ABNORMAL HIGH (ref 0.0–0.1)
Basophils Relative: 1.6 % (ref 0.0–3.0)
Eosinophils Absolute: 0.1 10*3/uL (ref 0.0–0.7)
Eosinophils Relative: 0.4 % (ref 0.0–5.0)
HCT: 43.2 % (ref 36.0–46.0)
Hemoglobin: 14 g/dL (ref 12.0–15.0)
Lymphocytes Relative: 8.4 % — ABNORMAL LOW (ref 12.0–46.0)
Lymphs Abs: 1.2 10*3/uL (ref 0.7–4.0)
MCHC: 32.4 g/dL (ref 30.0–36.0)
MCV: 88.8 fl (ref 78.0–100.0)
Monocytes Absolute: 1.1 10*3/uL — ABNORMAL HIGH (ref 0.1–1.0)
Monocytes Relative: 8 % (ref 3.0–12.0)
Neutro Abs: 11.7 10*3/uL — ABNORMAL HIGH (ref 1.4–7.7)
Neutrophils Relative %: 81.6 % — ABNORMAL HIGH (ref 43.0–77.0)
Platelets: 229 10*3/uL (ref 150.0–400.0)
RBC: 4.86 Mil/uL (ref 3.87–5.11)
RDW: 13.7 % (ref 11.5–15.5)
WBC: 14.3 10*3/uL — ABNORMAL HIGH (ref 4.0–10.5)

## 2018-12-01 LAB — URINALYSIS, COMPLETE (UACMP) WITH MICROSCOPIC
Bacteria, UA: NONE SEEN
Bilirubin Urine: NEGATIVE
Glucose, UA: 150 mg/dL — AB
Hgb urine dipstick: NEGATIVE
Ketones, ur: NEGATIVE mg/dL
Leukocytes,Ua: NEGATIVE
Nitrite: NEGATIVE
Protein, ur: NEGATIVE mg/dL
Specific Gravity, Urine: 1.017 (ref 1.005–1.030)
Squamous Epithelial / HPF: NONE SEEN (ref 0–5)
pH: 5 (ref 5.0–8.0)

## 2018-12-01 LAB — LIPID PANEL
Cholesterol: 170 mg/dL (ref 0–200)
HDL: 45.6 mg/dL (ref >39.00)
LDL Cholesterol: 90 mg/dL (ref 0–99)
NonHDL: 124.56
Total CHOL/HDL Ratio: 4
Triglycerides: 171 mg/dL — ABNORMAL HIGH (ref 0.0–149.0)
VLDL: 34.2 mg/dL (ref 0.0–40.0)

## 2018-12-01 LAB — COMPREHENSIVE METABOLIC PANEL
ALT: 24 U/L (ref 0–44)
AST: 46 U/L — ABNORMAL HIGH (ref 15–41)
Albumin: 3.7 g/dL (ref 3.5–5.0)
Alkaline Phosphatase: 77 U/L (ref 38–126)
Anion gap: 11 (ref 5–15)
BUN: 43 mg/dL — ABNORMAL HIGH (ref 8–23)
CO2: 29 mmol/L (ref 22–32)
Calcium: 9.8 mg/dL (ref 8.9–10.3)
Chloride: 98 mmol/L (ref 98–111)
Creatinine, Ser: 2.65 mg/dL — ABNORMAL HIGH (ref 0.44–1.00)
GFR calc Af Amer: 18 mL/min — ABNORMAL LOW (ref >60)
GFR calc non Af Amer: 16 mL/min — ABNORMAL LOW (ref >60)
Glucose, Bld: 132 mg/dL — ABNORMAL HIGH (ref 70–99)
Potassium: 4.5 mmol/L (ref 3.5–5.1)
Sodium: 138 mmol/L (ref 135–145)
Total Bilirubin: 1 mg/dL (ref 0.3–1.2)
Total Protein: 6.7 g/dL (ref 6.5–8.1)

## 2018-12-01 LAB — HEPATIC FUNCTION PANEL
ALT: 20 U/L (ref 0–35)
AST: 34 U/L (ref 0–37)
Albumin: 4.1 g/dL (ref 3.5–5.2)
Alkaline Phosphatase: 76 U/L (ref 39–117)
Bilirubin, Direct: 0 mg/dL (ref 0.0–0.3)
Total Bilirubin: 0.7 mg/dL (ref 0.2–1.2)
Total Protein: 6.8 g/dL (ref 6.0–8.3)

## 2018-12-01 LAB — BASIC METABOLIC PANEL
BUN: 41 mg/dL — ABNORMAL HIGH (ref 6–23)
CO2: 26 mEq/L (ref 19–32)
Calcium: 10.9 mg/dL — ABNORMAL HIGH (ref 8.4–10.5)
Chloride: 96 mEq/L (ref 96–112)
Creatinine, Ser: 2.5 mg/dL — ABNORMAL HIGH (ref 0.40–1.20)
GFR: 18.27 mL/min — ABNORMAL LOW (ref >60.00)
Glucose, Bld: 109 mg/dL — ABNORMAL HIGH (ref 70–99)
Potassium: 4.4 mEq/L (ref 3.5–5.1)
Sodium: 137 mEq/L (ref 135–145)

## 2018-12-01 LAB — SARS CORONAVIRUS 2 BY RT PCR (HOSPITAL ORDER, PERFORMED IN ~~LOC~~ HOSPITAL LAB): SARS Coronavirus 2: NEGATIVE

## 2018-12-01 LAB — CK: Total CK: 1174 U/L — ABNORMAL HIGH (ref 38–234)

## 2018-12-01 LAB — LACTIC ACID, PLASMA
Lactic Acid, Venous: 1.4 mmol/L (ref 0.5–1.9)
Lactic Acid, Venous: 1.4 mmol/L (ref 0.5–1.9)

## 2018-12-01 LAB — HEMOGLOBIN A1C: Hgb A1c MFr Bld: 6.2 % (ref 4.6–6.5)

## 2018-12-01 MED ORDER — SODIUM CHLORIDE 0.9 % IV SOLN
1.0000 g | Freq: Once | INTRAVENOUS | Status: AC
  Administered 2018-12-01: 1 g via INTRAVENOUS
  Filled 2018-12-01: qty 10

## 2018-12-01 MED ORDER — TRAMADOL HCL 50 MG PO TABS
50.0000 mg | ORAL_TABLET | Freq: Two times a day (BID) | ORAL | Status: DC
  Administered 2018-12-02 – 2018-12-03 (×3): 50 mg via ORAL
  Filled 2018-12-01 (×3): qty 1

## 2018-12-01 MED ORDER — ACETAMINOPHEN 325 MG PO TABS
650.0000 mg | ORAL_TABLET | Freq: Four times a day (QID) | ORAL | Status: DC | PRN
  Administered 2018-12-03: 06:00:00 650 mg via ORAL
  Filled 2018-12-01: qty 2

## 2018-12-01 MED ORDER — SODIUM CHLORIDE 0.9 % IV SOLN: 2.0000 g | Freq: Once | INTRAVENOUS | Status: DC

## 2018-12-01 MED ORDER — SODIUM CHLORIDE 0.9 % IV SOLN
2.0000 g | INTRAVENOUS | Status: DC
  Administered 2018-12-02: 03:00:00 2 g via INTRAVENOUS
  Filled 2018-12-01 (×2): qty 2

## 2018-12-01 MED ORDER — SODIUM CHLORIDE 0.9 % IV BOLUS (SEPSIS)
250.0000 mL | Freq: Once | INTRAVENOUS | Status: AC
  Administered 2018-12-02: 250 mL via INTRAVENOUS

## 2018-12-01 MED ORDER — PRAVASTATIN SODIUM 40 MG PO TABS
40.0000 mg | ORAL_TABLET | Freq: Every day | ORAL | Status: DC
  Administered 2018-12-02: 40 mg via ORAL
  Filled 2018-12-01: qty 2
  Filled 2018-12-01 (×2): qty 1

## 2018-12-01 MED ORDER — HEPARIN SODIUM (PORCINE) 5000 UNIT/ML IJ SOLN
5000.0000 [IU] | Freq: Three times a day (TID) | INTRAMUSCULAR | Status: DC
  Administered 2018-12-02 – 2018-12-03 (×5): 5000 [IU] via SUBCUTANEOUS
  Filled 2018-12-01 (×4): qty 1

## 2018-12-01 MED ORDER — SODIUM CHLORIDE 0.9 % IV BOLUS (SEPSIS)
1000.0000 mL | Freq: Once | INTRAVENOUS | Status: AC
  Administered 2018-12-02: 04:00:00 1000 mL via INTRAVENOUS

## 2018-12-01 MED ORDER — PREGABALIN 50 MG PO CAPS
50.0000 mg | ORAL_CAPSULE | Freq: Two times a day (BID) | ORAL | Status: DC
  Administered 2018-12-02 – 2018-12-03 (×3): 50 mg via ORAL
  Filled 2018-12-01 (×3): qty 1

## 2018-12-01 MED ORDER — MIRTAZAPINE 15 MG PO TABS
30.0000 mg | ORAL_TABLET | Freq: Every day | ORAL | Status: DC
  Administered 2018-12-02: 30 mg via ORAL
  Filled 2018-12-01: qty 2

## 2018-12-01 MED ORDER — VANCOMYCIN HCL IN DEXTROSE 1-5 GM/200ML-% IV SOLN: 1000.0000 mg | Freq: Once | INTRAVENOUS | Status: DC

## 2018-12-01 MED ORDER — VANCOMYCIN HCL 1.5 G IV SOLR
1500.0000 mg | Freq: Once | INTRAVENOUS | Status: AC
  Administered 2018-12-02: 1500 mg via INTRAVENOUS
  Filled 2018-12-01: qty 1500

## 2018-12-01 MED ORDER — ACETAMINOPHEN 650 MG RE SUPP: 650.0000 mg | Freq: Four times a day (QID) | RECTAL | Status: DC | PRN

## 2018-12-01 MED ORDER — METRONIDAZOLE IN NACL 5-0.79 MG/ML-% IV SOLN
500.0000 mg | Freq: Three times a day (TID) | INTRAVENOUS | Status: DC
  Administered 2018-12-02: 02:00:00 500 mg via INTRAVENOUS
  Filled 2018-12-01 (×3): qty 100

## 2018-12-01 MED ORDER — SODIUM CHLORIDE 0.9 % IV BOLUS
500.0000 mL | Freq: Once | INTRAVENOUS | Status: AC
  Administered 2018-12-01: 500 mL via INTRAVENOUS

## 2018-12-01 MED ORDER — ADULT MULTIVITAMIN W/MINERALS CH
1.0000 | ORAL_TABLET | Freq: Every day | ORAL | Status: DC
  Administered 2018-12-02 – 2018-12-03 (×2): 1 via ORAL
  Filled 2018-12-01 (×2): qty 1

## 2018-12-01 MED ORDER — SODIUM CHLORIDE 0.9% FLUSH: 3.0000 mL | Freq: Once | INTRAVENOUS | Status: DC

## 2018-12-01 MED ORDER — SODIUM CHLORIDE 0.9 % IV BOLUS (SEPSIS): 500.0000 mL | Freq: Once | INTRAVENOUS | Status: DC

## 2018-12-01 MED ORDER — CLONAZEPAM 0.5 MG PO TABS
0.5000 mg | ORAL_TABLET | Freq: Every day | ORAL | Status: DC | PRN
  Administered 2018-12-03: 0.5 mg via ORAL
  Filled 2018-12-01: qty 1

## 2018-12-01 NOTE — ED Notes (Signed)
Pt's son leaving, Arneisha Kincannon [phone] Pt's sister Caesar Chestnut [phone]

## 2018-12-01 NOTE — ED Notes (Signed)
Pt's IV fluids restarted. Pt's personal socks put in pt belongings bag. Yellow nonslip socks applied. Family remains at bedside.

## 2018-12-01 NOTE — ED Notes (Signed)
Pt reports she has been feeling tired in the past days, her son at bedside reports pt fell and hit her head, pt lives alone.

## 2018-12-01 NOTE — Assessment & Plan Note (Signed)
Takes clonazepam one daily.  remeron at night.  This regimen has worked well for her.  Have tried to decrease clonazepam.  Anxiety increased significantly.  Discussed with family.  Will continue current medication regimen.

## 2018-12-01 NOTE — ED Provider Notes (Signed)
[facility] Emergency Department Provider Note ____________________________________________   First MD Initiated Contact with Patient 12/01/18 1922     (approximate)  I have reviewed the triage vital signs and the nursing notes.   HISTORY  Chief Complaint Weakness, Altered Mental Status, and Abnormal Lab  EM caveat: Patient dementia, altered mental status  HPI Diana Lester is a 85 y.o. female presents for evaluation for worsening confusion, fatigue and a fall.   For the patient son the patient has had increased weakness and confusion for at least last 2 weeks.  They are suspicious she could have something like an infection or urinary tract infection.  They saw her doctor today and they identified that her kidney tests are getting worse as well, and she is just been acting more confused than baseline.  They also found her had a fall earlier today but she did not have any obvious injury other than some slight scrapes on the left lower leg and complained of slight pain over the left back of her scalp  No no nausea or vomiting.  She is always confused, baseline dementia, son reports she has almost no short-term memory at all which is not unusual but she seems just slightly more off for at least the last 2 weeks  Past Medical History:  Diagnosis Date  . Arthritis   . Breast cancer ([place]) 1995   LT LUMPECTOMY & s/p XRT  . Depression   . Hx of colonic polyp   . Hypercholesterolemia   . Hypertension   . Melanoma ([place])   . Personal history of radiation therapy   . Spinal stenosis    lumbar    Patient Active Problem List   Diagnosis Date Noted  . Memory change 12/01/2018  . Unsteady gait 12/01/2018  . Elevated hemoglobin ([place]) 07/23/2018  . Right knee pain 03/11/2018  . Urinary frequency 06/25/2017  . Lumbar spinal stenosis 01/23/2016  . Hypertension 01/23/2016  . History of depression 01/23/2016  . 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 Right lower  quadrant abdominal pain   . Anxiety 10/20/2015  . Loss of weight 10/20/2015  . Hyponatremia 08/07/2014  . Health care maintenance 05/05/2014  . Skin cancer 01/29/2014  . Other malaise and fatigue 10/21/2013  . Skin lesion of cheek 10/21/2013  . Renal cyst 03/22/2013  . Chronic kidney disease, stage III (moderate) ([place]) 03/14/2013  . Abdominal bruit 03/14/2013  . Spinal stenosis of lumbar region 12/09/2012  . Bulging disc 12/09/2012  . History of colonic polyps 12/09/2012  . History of breast cancer 12/09/2012  . Mild depression ([place]) 12/09/2012  . Essential hypertension, benign 12/09/2012  . Hyperlipidemia 12/09/2012    Past Surgical History:  Procedure Laterality Date  . BREAST BIOPSY Left 2006   EXCISIONAL - NEG  . BREAST BIOPSY Left 1995   LT LUMPECTOMY FOR CA  . BREAST LUMPECTOMY Left 1995  . EYE SURGERY Bilateral    cataract excision  . LAPAROSCOPIC APPENDECTOMY N/A 01/11/2016   Procedure: APPENDECTOMY LAPAROSCOPIC;  Surgeon: Dia Crawford III, MD;  Location: [facility] ORS;  Service: General;  Laterality: N/A;    Prior to Admission medications   Medication Sig Start Date End Date Taking? Authorizing Provider  clonazePAM (KLONOPIN) 0.5 MG tablet TAKE 1 TABLET(0.5 MG) BY MOUTH DAILY AS NEEDED FOR ANXIETY 09/06/18  Yes Einar Pheasant, MD  lisinopril-hydrochlorothiazide (ZESTORETIC) 20-12.5 MG tablet TAKE 1 TABLET BY MOUTH DAILY 08/14/18  Yes Einar Pheasant, MD  lovastatin (MEVACOR) 40 MG tablet TAKE  1 TO 2 TABLETS(40 TO 80 MG) BY MOUTH DAILY 10/27/18  Yes Einar Pheasant, MD  mirtazapine (REMERON) 30 MG tablet Take 1 tablet (30 mg total) by mouth at bedtime. 10/27/18  Yes Einar Pheasant, MD  Multiple Vitamin (MULTIVITAMIN) tablet Take 1 tablet by mouth daily.   Yes [provider]  pregabalin (LYRICA) 50 MG capsule Take 50 mg by mouth 2 (two) times daily.    Yes [provider]  traMADol (ULTRAM) 50 MG tablet Take 50 mg by mouth 2 (two) times daily. 11/07/18  Yes  [provider]    Allergies Contrast media [iodinated diagnostic agents]  Family History  Problem Relation Age of Onset  . Arthritis Mother   . Hyperlipidemia Mother   . Hypertension Mother   . Hyperlipidemia Sister   . Kidney cancer Sister   . Breast cancer Neg Hx     Social History Social History   Tobacco Use  . Smoking status: Former Smoker    Packs/day: 0.50    Years: 4.00    Pack years: 2.00    Types: Cigarettes    Quit date: 12/09/1984    Years since quitting: 34.0  . Smokeless tobacco: Never Used  . Tobacco comment: quit 12/09/84  Substance Use Topics  . Alcohol use: No    Alcohol/week: 0.0 standard drinks    Comment: sparingly  . Drug use: No    Review of Systems Constitutional: Fever noted at the ER, there is no known COVID exposure Eyes: No visual changes. ENT: No sore throat. Cardiovascular: Denies chest pain. Respiratory: Denies shortness of breath. Gastrointestinal: No abdominal pain.   Genitourinary: Negative for dysuria.  Family suspect urinary tract section might be possible Musculoskeletal: Negative for back pain except for chronic back pain which she lives with every day.  No neck pain.. Skin: Negative for rash except for a couple skin tears and left leg. Neurological: Negative for headaches, areas of focal weakness or numbness.    ____________________________________________   PHYSICAL EXAM:  VITAL SIGNS: ED Triage Vitals  Enc Vitals Group     BP 12/01/18 1833 (!) 99/41     Pulse Rate 12/01/18 1833 84     Resp 12/01/18 1833 18     Temp 12/01/18 1833 (!) 100.7 F (38.2 C)     Temp Source 12/01/18 1833 Oral     SpO2 12/01/18 1833 96 %     Weight 12/01/18 1834 126 lb 8.7 oz (57.4 kg)     Height 12/01/18 1834 5\' 4"  (1.626 m)     Head Circumference --      Peak Flow --      Pain Score 12/01/18 1834 0     Pain Loc --      Pain Edu? --      Excl. in [place]? --     Constitutional: Alert and oriented to self and son. Well  appearing and in no acute distress. Eyes: Conjunctivae are normal. Head: Atraumatic. Nose: No congestion/rhinnorhea. Mouth/Throat: Mucous membranes are moist. Neck: No stridor.  No cervical tenderness.  Full range of motion neck without pain. Cardiovascular: Normal rate, regular rhythm. Grossly normal heart sounds.  Good peripheral circulation. Respiratory: Normal respiratory effort.  No retractions. Lungs CTAB. Gastrointestinal: Soft and nontender. No distention. Musculoskeletal: No lower extremity tenderness nor edema but there are a couple very small skin tears involving left lower leg without evidence of superinfection. Neurologic:  Normal speech and language. No gross focal neurologic deficits are appreciated.  Skin:  Skin is warm, dry and intact. No rash noted. Psychiatric: Mood and affect are normal to very calm.  ____________________________________________   LABS (all labs ordered are listed, but only abnormal results are displayed)  Labs Reviewed  COMPREHENSIVE METABOLIC PANEL - Abnormal; Notable for the following components:      Result Value   Glucose, Bld 132 (*)    BUN 43 (*)    Creatinine, Ser 2.65 (*)    AST 46 (*)    GFR calc non Af Amer 16 (*)    GFR calc Af Amer 18 (*)    All other components within normal limits  CBC - Abnormal; Notable for the following components:   WBC 15.9 (*)    All other components within normal limits  URINALYSIS, COMPLETE (UACMP) WITH MICROSCOPIC - Abnormal; Notable for the following components:   Color, Urine YELLOW (*)    APPearance HAZY (*)    Glucose, UA 150 (*)    All other components within normal limits  CK - Abnormal; Notable for the following components:   Total CK 1,174 (*)    All other components within normal limits  SARS CORONAVIRUS 2 (HOSPITAL ORDER, [place] [facility])  CULTURE, BLOOD (ROUTINE X 2)  CULTURE, BLOOD (ROUTINE X 2)  LACTIC ACID, PLASMA  LACTIC ACID, PLASMA  CBG MONITORING, ED    ____________________________________________  EKG  ED ECG REPORT I, Delman Kitten, the attending physician, personally viewed and interpreted this ECG.  Date: 12/01/2018 EKG Time: 2220 Rate: 70 Rhythm: normal sinus rhythm QRS Axis: normal Intervals: normal ST/T Wave abnormalities: normal Narrative Interpretation: no evidence of acute ischemia  ____________________________________________  RADIOLOGY  Ct Head Wo Contrast  Result Date: 12/01/2018 CLINICAL DATA:  Fall with trauma to the head.  Weakness. EXAM: CT HEAD WITHOUT CONTRAST TECHNIQUE: Contiguous axial images were obtained from the base of the skull through the vertex without intravenous contrast. COMPARISON:  MRI 05/06/2017 FINDINGS: Brain: Age related volume loss. Mild chronic small-vessel change of the cerebral hemispheric white matter. No sign of acute infarction, mass lesion, hemorrhage, hydrocephalus or extra-axial collection. Vascular: There is atherosclerotic calcification of the major vessels at the base of the brain. Skull: No skull fracture. Sinuses/Orbits: Clear/normal Other: None IMPRESSION: No acute or traumatic finding. Mild chronic small-vessel ischemic change of the cerebral hemispheric white matter considering age. Electronically Signed   By: Nelson Chimes M.D.   On: 12/01/2018 20:49   Dg Chest Port 1 View  Result Date: 12/01/2018 CLINICAL DATA:  Altered mental status, fever EXAM: PORTABLE CHEST 1 VIEW COMPARISON:  None. FINDINGS: Heart and mediastinal contours are within normal limits. No focal opacities or effusions. No acute bony abnormality. IMPRESSION: No active cardiopulmonary disease. Electronically Signed   By: Rolm Baptise M.D.   On: 12/01/2018 19:46    Imaging study reviewed negative for acute findings. ____________________________________________   PROCEDURES  Procedure(s) performed: None  Procedures  Critical Care performed: No  ____________________________________________   INITIAL  IMPRESSION / ASSESSMENT AND PLAN / ED COURSE  Pertinent labs & imaging results that were available during my care of the patient were reviewed by me and considered in my medical decision making (see chart for details).   Patient presents for evaluation of weakness and worsening renal function.  Noted to be mild hypotension, febrile at triage.  She is alert, slight change in mental status for last couple weeks.  Elevated white count, she does appear to likely have infection I suspect at least some evidence  of sepsis and there is no evidence of severe sepsis with her lactate being normal and she is not experiencing severe hypotension  Will obtain CT of the head to evaluate for intracranial trauma though suspect it unlikely based on clinical exam.  Chest x-ray, low risk felt for COVID but will send COVID swab as she is febrile.  Chest x-ray etc.  Anticipate admission to the hospital.  We will start by rehydrating the patient, monitoring closely.     ____________________________________________   FINAL CLINICAL IMPRESSION(S) / ED DIAGNOSES  Final diagnoses:  Fever, unspecified fever cause  Generalized weakness  Non-traumatic rhabdomyolysis        Note:  This document was prepared using Dragon voice recognition software and may include unintentional dictation errors       Delman Kitten, MD 12/01/18 2348

## 2018-12-01 NOTE — Assessment & Plan Note (Signed)
Has lost weight.  Not sure how much she is eating.  Weight from 03/03/18 (130 pounds - 138 pounds documented last check).  Discussed with family.  Meals on wheels.  Discussed sitters and assisted living.

## 2018-12-01 NOTE — ED Notes (Signed)
Pt given more warm blankets. Prompted again to keep L arm straight so that IV fluids can finish.

## 2018-12-01 NOTE — Assessment & Plan Note (Signed)
Blood pressure under good control.  Continue same medication regimen.  Follow pressures.  Follow metabolic panel.   

## 2018-12-01 NOTE — ED Notes (Signed)
Eyvonne Mechanic RN and Ena Dawley RN continue to work on blood cultures/etc.

## 2018-12-01 NOTE — ED Notes (Signed)
ED TO INPATIENT HANDOFF REPORT  ED Nurse Name and Phone #: Ena Dawley [phone]  S Name/Age/Gender Diana Lester 85 y.o. female Room/Bed: ED09A/ED09A  Code Status   Code Status: Full Code  Home/SNF/Other Home Patient oriented to: self, place and time Is this baseline? No   Triage Complete: Triage complete  Chief Complaint fall  Triage Note Pt here with her son, pt was seen at [facility] office yesterday and had blood work done and was reported to have an elevated wbc, elevated calcium level as well as elevated kidney levels. Pt fell today in her home and was unable to get up, son reports that she hasn't been walking for the past couple days and thi sis new for her, pt lives alone   Allergies Allergies  Allergen Reactions  . Contrast Media [Iodinated Diagnostic Agents] Hives    Level of Care/Admitting Diagnosis ED Disposition    ED Disposition Condition [facility]: [place] [[id number]]  Level of Care: Med-Surg [16]  Covid Evaluation: Confirmed COVID Negative  Diagnosis: Sepsis [facility]) [[id number]]  Admitting Physician: Eula Flax  Attending Physician: Rufina Falco ACHIENG [[id number]]  Estimated length of stay: past midnight tomorrow  Certification:: I certify this patient will need inpatient services for at least 2 midnights  PT Class (Do Not Modify): Inpatient [101]  PT Acc Code (Do Not Modify): Private [1]       B Medical/Surgery History Past Medical History:  Diagnosis Date  . Arthritis   . Breast cancer ([place]) 1995   LT LUMPECTOMY & s/p XRT  . Depression   . Hx of colonic polyp   . Hypercholesterolemia   . Hypertension   . Melanoma ([place])   . Personal history of radiation therapy   . Spinal stenosis    lumbar   Past Surgical History:  Procedure Laterality Date  . BREAST BIOPSY Left 2006   EXCISIONAL - NEG  . BREAST BIOPSY Left 1995   LT LUMPECTOMY FOR CA  . BREAST LUMPECTOMY Left 1995  .  EYE SURGERY Bilateral    cataract excision  . LAPAROSCOPIC APPENDECTOMY N/A 01/11/2016   Procedure: APPENDECTOMY LAPAROSCOPIC;  Surgeon: Dia Crawford III, MD;  Location: [facility] ORS;  Service: General;  Laterality: N/A;     A IV Location/Drains/Wounds Patient Lines/Drains/Airways Status   Active Line/Drains/Airways    Name:   Placement date:   Placement time:   Site:   Days:   Peripheral IV 12/01/18 Left Antecubital   12/01/18    1955    Antecubital   less than 1   Incision - 4 Ports Abdomen 1: Lower;Mid 2: Umbilicus 3: Mid;Upper 4: Right   01/11/16    1605     1055          Intake/Output Last 24 hours  Intake/Output Summary (Last 24 hours) at 12/01/2018 2312 Last data filed at 12/01/2018 2012 Gross per 24 hour  Intake -  Output 200 ml  Net -200 ml    Labs/Imaging Results for orders placed or performed during the hospital encounter of 12/01/18 (from the past 48 hour(s))  Comprehensive metabolic panel     Status: Abnormal   Collection Time: 12/01/18  6:36 PM  Result Value Ref Range   Sodium 138 135 - 145 mmol/L   Potassium 4.5 3.5 - 5.1 mmol/L   Chloride 98 98 - 111 mmol/L   CO2 29 22 - 32 mmol/L   Glucose, Bld 132 (H) 70 - 99  mg/dL   BUN 43 (H) 8 - 23 mg/dL   Creatinine, Ser 2.65 (H) 0.44 - 1.00 mg/dL   Calcium 9.8 8.9 - 10.3 mg/dL   Total Protein 6.7 6.5 - 8.1 g/dL   Albumin 3.7 3.5 - 5.0 g/dL   AST 46 (H) 15 - 41 U/L   ALT 24 0 - 44 U/L   Alkaline Phosphatase 77 38 - 126 U/L   Total Bilirubin 1.0 0.3 - 1.2 mg/dL   GFR calc non Af Amer 16 (L) >60 mL/min   GFR calc Af Amer 18 (L) >60 mL/min   Anion gap 11 5 - 15    Comment: Performed at [facility], [address] [place], [place] [address]  CBC     Status: Abnormal   Collection Time: 12/01/18  6:36 PM  Result Value Ref Range   WBC 15.9 (H) 4.0 - 10.5 K/uL   RBC 4.80 3.87 - 5.11 MIL/uL   Hemoglobin 13.9 12.0 - 15.0 g/dL   HCT 42.8 36.0 - 46.0 %   MCV 89.2 80.0 - 100.0 fL   MCH 29.0 26.0 - 34.0 pg   MCHC  32.5 30.0 - 36.0 g/dL   RDW 13.2 11.5 - 15.5 %   Platelets 239 150 - 400 K/uL   nRBC 0.0 0.0 - 0.2 %    Comment: Performed at [facility], [address] [place], [place] [address]  CK     Status: Abnormal   Collection Time: 12/01/18  6:36 PM  Result Value Ref Range   Total CK 1,174 (H) 38 - 234 U/L    Comment: Performed at [facility], [address] [place], [place] [address]  Lactic acid, plasma     Status: None   Collection Time: 12/01/18  6:37 PM  Result Value Ref Range   Lactic Acid, Venous 1.4 0.5 - 1.9 mmol/L    Comment: Performed at [facility], [address] [place], [place] [address]  Urinalysis, Complete w Microscopic     Status: Abnormal   Collection Time: 12/01/18  7:52 PM  Result Value Ref Range   Color, Urine YELLOW (A) YELLOW   APPearance HAZY (A) CLEAR   Specific Gravity, Urine 1.017 1.005 - 1.030   pH 5.0 5.0 - 8.0   Glucose, UA 150 (A) NEGATIVE mg/dL   Hgb urine dipstick NEGATIVE NEGATIVE   Bilirubin Urine NEGATIVE NEGATIVE   Ketones, ur NEGATIVE NEGATIVE mg/dL   Protein, ur NEGATIVE NEGATIVE mg/dL   Nitrite NEGATIVE NEGATIVE   Leukocytes,Ua NEGATIVE NEGATIVE   RBC / HPF 0-5 0 - 5 RBC/hpf   WBC, UA 0-5 0 - 5 WBC/hpf   Bacteria, UA NONE SEEN NONE SEEN   Squamous Epithelial / LPF NONE SEEN 0 - 5   Mucus PRESENT     Comment: Performed at [facility], [address] [place], [place] [address]  Lactic acid, plasma     Status: None   Collection Time: 12/01/18  7:53 PM  Result Value Ref Range   Lactic Acid, Venous 1.4 0.5 - 1.9 mmol/L    Comment: Performed at [facility], [address] [place], [place] [address]  SARS Coronavirus 2 [facility] order, Performed in [facility] hospital lab) Nasopharyngeal Nasopharyngeal Swab     Status: None   Collection Time: 12/01/18  7:53 PM   Specimen: Nasopharyngeal Swab  Result Value Ref Range   SARS Coronavirus 2 NEGATIVE NEGATIVE    Comment: (NOTE) If result  is NEGATIVE SARS-CoV-2 target nucleic acids are NOT DETECTED. The SARS-CoV-2 RNA is generally detectable in upper and lower  respiratory specimens during the acute phase of infection. The lowest  concentration of SARS-CoV-2 viral copies this assay can detect is 250  copies / mL. A negative result does not preclude SARS-CoV-2 infection  and should not be used as the sole basis for treatment or other  patient management decisions.  A negative result may occur with  improper specimen collection / handling, submission of specimen other  than nasopharyngeal swab, presence of viral mutation(s) within the  areas targeted by this assay, and inadequate number of viral copies  (<250 copies / mL). A negative result must be combined with clinical  observations, patient history, and epidemiological information. If result is POSITIVE SARS-CoV-2 target nucleic acids are DETECTED. The SARS-CoV-2 RNA is generally detectable in upper and lower  respiratory specimens dur ing the acute phase of infection.  Positive  results are indicative of active infection with SARS-CoV-2.  Clinical  correlation with patient history and other diagnostic information is  necessary to determine patient infection status.  Positive results do  not rule out bacterial infection or co-infection with other viruses. If result is PRESUMPTIVE POSTIVE SARS-CoV-2 nucleic acids MAY BE PRESENT.   A presumptive positive result was obtained on the submitted specimen  and confirmed on repeat testing.  While 2019 novel coronavirus  (SARS-CoV-2) nucleic acids may be present in the submitted sample  additional confirmatory testing may be necessary for epidemiological  and / or clinical management purposes  to differentiate between  SARS-CoV-2 and other Sarbecovirus currently known to infect humans.  If clinically indicated additional testing with an alternate test  methodology [phone]) is advised. The SARS-CoV-2 RNA is generally   detectable in upper and lower respiratory sp ecimens during the acute  phase of infection. The expected result is Negative. Fact Sheet for Patients:  StrictlyIdeas.no Fact Sheet for Healthcare Providers: BankingDealers.co.za This test is not yet approved or cleared by the [place] FDA and has been authorized for detection and/or diagnosis of SARS-CoV-2 by FDA under an Emergency Use Authorization (EUA).  This EUA will remain in effect (meaning this test can be used) for the duration of the COVID-19 declaration under Section 564(b)(1) of the Act, 21 U.S.C. section 360bbb-3(b)(1), unless the authorization is terminated or revoked sooner. Performed at [facility], [place],  [address]    Ct Head Wo Contrast  Result Date: 12/01/2018 CLINICAL DATA:  Fall with trauma to the head.  Weakness. EXAM: CT HEAD WITHOUT CONTRAST TECHNIQUE: Contiguous axial images were obtained from the base of the skull through the vertex without intravenous contrast. COMPARISON:  MRI 05/06/2017 FINDINGS: Brain: Age related volume loss. Mild chronic small-vessel change of the cerebral hemispheric white matter. No sign of acute infarction, mass lesion, hemorrhage, hydrocephalus or extra-axial collection. Vascular: There is atherosclerotic calcification of the major vessels at the base of the brain. Skull: No skull fracture. Sinuses/Orbits: Clear/normal Other: None IMPRESSION: No acute or traumatic finding. Mild chronic small-vessel ischemic change of the cerebral hemispheric white matter considering age. Electronically Signed   By: Nelson Chimes M.D.   On: 12/01/2018 20:49   Dg Chest Port 1 View  Result Date: 12/01/2018 CLINICAL DATA:  Altered mental status, fever EXAM: PORTABLE CHEST 1 VIEW COMPARISON:  None. FINDINGS: Heart and mediastinal contours are within normal limits. No focal opacities or effusions. No acute bony abnormality.  IMPRESSION: No active cardiopulmonary disease. Electronically  Signed   By: Rolm Baptise M.D.   On: 12/01/2018 19:46    Pending Labs Unresulted Labs (From admission, onward)    Start     Ordered   12/02/18 0500  CBC  Tomorrow morning,   STAT     12/01/18 2225   12/02/18 0500  Protime-INR  Tomorrow morning,   STAT     12/01/18 2225   12/02/18 2725  Basic metabolic panel  Tomorrow morning,   STAT     12/01/18 2225   12/01/18 2224  Procalcitonin  ONCE - STAT,   STAT     12/01/18 2225   12/01/18 1921  Blood Culture (routine x 2)  BLOOD CULTURE X 2,   STAT     12/01/18 1921          Vitals/Pain Today's Vitals   12/01/18 2215 12/01/18 2230 12/01/18 2245 12/01/18 2249  BP:  106/86    Pulse: 69 71 83   Resp: 20 20 14    Temp:      TempSrc:      SpO2: 100%   100%  Weight:      Height:      PainSc:        Isolation Precautions No active isolations  Medications Medications  sodium chloride flush (NS) 0.9 % injection 3 mL (has no administration in time range)  traMADol (ULTRAM) tablet 50 mg (has no administration in time range)  pravastatin (PRAVACHOL) tablet 40 mg (has no administration in time range)  mirtazapine (REMERON) tablet 30 mg (has no administration in time range)  clonazePAM (KLONOPIN) tablet 0.5 mg (has no administration in time range)  pregabalin (LYRICA) capsule 50 mg (has no administration in time range)  multivitamin with minerals tablet 1 tablet (has no administration in time range)  heparin injection 5,000 Units (has no administration in time range)  sodium chloride 0.9 % bolus 1,000 mL (has no administration in time range)    And  sodium chloride 0.9 % bolus 500 mL (has no administration in time range)    And  sodium chloride 0.9 % bolus 250 mL (has no administration in time range)  metroNIDAZOLE (FLAGYL) IVPB 500 mg (has no administration in time range)  acetaminophen (TYLENOL) tablet 650 mg (has no administration in time range)    Or  acetaminophen  (TYLENOL) suppository 650 mg (has no administration in time range)  vancomycin (VANCOCIN) 1,500 mg in sodium chloride 0.9 % 500 mL IVPB (has no administration in time range)  ceFEPIme (MAXIPIME) 2 g in sodium chloride 0.9 % 100 mL IVPB (has no administration in time range)  sodium chloride 0.9 % bolus 500 mL (0 mLs Intravenous Stopped 12/01/18 2108)  cefTRIAXone (ROCEPHIN) 1 g in sodium chloride 0.9 % 100 mL IVPB (0 g Intravenous Stopped 12/01/18 2107)    Mobility walks High fall risk   Focused Assessments Neuro Assessment Handoff:  Swallow screen pass? Yes    NIH Stroke Scale ( + Modified Stroke Scale Criteria)  Interval: Initial Level of Consciousness (1a.)   : Alert, keenly responsive LOC Questions (1b. )   +: Answers one question correctly LOC Commands (1c. )   + : Performs both tasks correctly Best Gaze (2. )  +: Normal Visual (3. )  +: No visual loss Facial Palsy (4. )    : Normal symmetrical movements Motor Arm, Left (5a. )   +: No drift Motor Arm, Right (5b. )   +: No drift Motor Leg, Left (6a. )   +:  No drift Motor Leg, Right (6b. )   +: No drift Limb Ataxia (7. ): Absent Sensory (8. )   +: Normal, no sensory loss Best Language (9. )   +: No aphasia Dysarthria (10. ): Normal Extinction/Inattention (11.)   +: No Abnormality Modified SS Total  +: 1 Complete NIHSS TOTAL: 1     Neuro Assessment: Exceptions to WDL Neuro Checks:   Initial (12/01/18 2011)  Last Documented NIHSS Modified Score: 1 (12/01/18 2011) Has TPA been given? No If patient is a Neuro Trauma and patient is going to OR before floor call report to [facility] nurse: [phone] or [phone]     R Recommendations: See Admitting Provider Note  Report given to:   Additional Notes:  Son was at bedside

## 2018-12-01 NOTE — Assessment & Plan Note (Signed)
Has been stable on remeron.  Follow.

## 2018-12-01 NOTE — ED Notes (Signed)
Pt given food tray and drink verbal okay from EDP.

## 2018-12-01 NOTE — ED Notes (Signed)
Pt back to room.

## 2018-12-01 NOTE — Assessment & Plan Note (Signed)
Memory change - worsening.  Worsening dementia.  Discussed with family and patient.  Discussed further w/up.  Will start with labs as outlined.  Check cbc, metabolic panel, thyroid, etc.  Also check urine to confirm no infection.  Discussed help in home.  Discussed my concerns regarding inability to take her medications as directed.  Discussed safety concerns.  Discussed assisted living.  Family plans to look in to options.

## 2018-12-01 NOTE — ED Notes (Signed)
Pt leaving for imaging.

## 2018-12-01 NOTE — Assessment & Plan Note (Signed)
Some unsteadiness noted on exam.  Discussed home health - PT.  Check labs as outlined.  Need assistance with medication to confirm taking correctly and not skipping and not doubling up.

## 2018-12-01 NOTE — Assessment & Plan Note (Signed)
Is followed by Dr Holley Raring.  Given pt decline, will recheck metabolic panel to confirm if kidney function stable.

## 2018-12-01 NOTE — ED Notes (Signed)
First call report att 

## 2018-12-01 NOTE — ED Triage Notes (Signed)
Pt here with her son, pt was seen at [facility] office yesterday and had blood work done and was reported to have an elevated wbc, elevated calcium level as well as elevated kidney levels. Pt fell today in her home and was unable to get up, son reports that she hasn't been walking for the past couple days and thi sis new for her, pt lives alone

## 2018-12-01 NOTE — H&P (Addendum)
[facility] at [place] NAME: Diana Lester    MR#:  [id number]  DATE OF BIRTH:  1931-02-23  DATE OF ADMISSION:  12/01/2018  PRIMARY CARE PHYSICIAN: Einar Pheasant, MD   REQUESTING/REFERRING PHYSICIAN: Delman Kitten, MD.  CHIEF COMPLAINT:   Chief Complaint  Patient presents with  . Weakness  . Altered Mental Status  . Abnormal Lab    HISTORY OF PRESENT ILLNESS:  85 y.o. female with pertinent past medical history of breast cancer, hyperlipidemia, PVD, GERD, hypertension, anxiety and depression, diverticulosis, CKD stage III, and MCI presenting to the ED with abnormal labs from PCPs office and fall.  Patient is not a good historian therefore history mostly obtained from patient's chart.  Per ED report patient was seen by PCP yesterday and had blood work done which showed elevated white count, renal function test and calcium.  Patient states she fell while using the bathroom and hit the back of her head.  Denies loss of consciousness however she does not recall the events of prior to or following the fall.  Patient's son reported to ED staff that patient has not been walking for the past couple of days and lives alone.  On arrival to the ED, she was febrile  100.7 with blood pressure 99/41 mm Hg and pulse rate 84 beats/min. There were no focal neurological deficits; she was alert and oriented x2.  Her labs revealed glucose 132, BUN 43, creatinine 2.65, AST 46, WBC 15.9, CK 1174.  Lactic acid 1.4.  COVID-19 negative.  Urinalysis shows no evidence of UTI.  Chest x-ray shows no active cardiopulmonary disease.  None CT head was obtained and shows no acute intracranial abnormality.  Hospitalist asked to admit for further evaluation and management.  PAST MEDICAL HISTORY:   Past Medical History:  Diagnosis Date  . Arthritis   . Breast cancer ([place]) 1995   LT LUMPECTOMY & s/p XRT  . Depression   . Hx of colonic polyp   . Hypercholesterolemia   .  Hypertension   . Melanoma ([place])   . Personal history of radiation therapy   . Spinal stenosis    lumbar    PAST SURGICAL HISTORY:   Past Surgical History:  Procedure Laterality Date  . BREAST BIOPSY Left 2006   EXCISIONAL - NEG  . BREAST BIOPSY Left 1995   LT LUMPECTOMY FOR CA  . BREAST LUMPECTOMY Left 1995  . EYE SURGERY Bilateral    cataract excision  . LAPAROSCOPIC APPENDECTOMY N/A 01/11/2016   Procedure: APPENDECTOMY LAPAROSCOPIC;  Surgeon: Dia Crawford III, MD;  Location: [facility] ORS;  Service: General;  Laterality: N/A;    SOCIAL HISTORY:   Social History   Tobacco Use  . Smoking status: Former Smoker    Packs/day: 0.50    Years: 4.00    Pack years: 2.00    Types: Cigarettes    Quit date: 12/09/1984    Years since quitting: 34.0  . Smokeless tobacco: Never Used  . Tobacco comment: quit 12/09/84  Substance Use Topics  . Alcohol use: No    Alcohol/week: 0.0 standard drinks    Comment: sparingly    FAMILY HISTORY:   Family History  Problem Relation Age of Onset  . Arthritis Mother   . Hyperlipidemia Mother   . Hypertension Mother   . Hyperlipidemia Sister   . Kidney cancer Sister   . Breast cancer Neg Hx     DRUG ALLERGIES:   Allergies  Allergen Reactions  .  Contrast Media [Iodinated Diagnostic Agents] Hives    REVIEW OF SYSTEMS:   ROS Unable to reliably obtain from patient due to underlying dementia.  MEDICATIONS AT HOME:   Prior to Admission medications   Medication Sig Start Date End Date Taking? Authorizing Provider  clonazePAM (KLONOPIN) 0.5 MG tablet TAKE 1 TABLET(0.5 MG) BY MOUTH DAILY AS NEEDED FOR ANXIETY 09/06/18  Yes Einar Pheasant, MD  lisinopril-hydrochlorothiazide (ZESTORETIC) 20-12.5 MG tablet TAKE 1 TABLET BY MOUTH DAILY 08/14/18  Yes Einar Pheasant, MD  lovastatin (MEVACOR) 40 MG tablet TAKE 1 TO 2 TABLETS(40 TO 80 MG) BY MOUTH DAILY 10/27/18  Yes Einar Pheasant, MD  mirtazapine (REMERON) 30 MG tablet Take 1 tablet (30 mg total) by  mouth at bedtime. 10/27/18  Yes Einar Pheasant, MD  Multiple Vitamin (MULTIVITAMIN) tablet Take 1 tablet by mouth daily.   Yes [provider]  pregabalin (LYRICA) 50 MG capsule Take 50 mg by mouth 2 (two) times daily.    Yes [provider]  traMADol (ULTRAM) 50 MG tablet Take 50 mg by mouth 2 (two) times daily. 11/07/18  Yes [provider]      VITAL SIGNS:  Blood pressure 106/86, pulse 83, temperature 99 F (37.2 C), temperature source Oral, resp. rate 14, height 5\' 4"  (1.626 m), weight 57.4 kg, SpO2 100 %.  PHYSICAL EXAMINATION:   Physical Exam  GENERAL:  85 y.o.-year-old patient lying in the bed with no acute distress.  EYES: Pupils equal, round, reactive to light and accommodation. No scleral icterus. Extraocular muscles intact.  HEENT: Hematoma right occipital, normocephalic. Oropharynx and nasopharynx clear.  NECK:  Supple, no jugular venous distention. No thyroid enlargement, no tenderness.  LUNGS: Normal breath sounds bilaterally, no wheezing, rales,rhonchi or crepitation. No use of accessory muscles of respiration.  CARDIOVASCULAR: S1, S2 normal. No murmurs, rubs, or gallops.  ABDOMEN: Soft, nontender, nondistended. Bowel sounds present. No organomegaly or mass.  EXTREMITIES: No pedal edema, cyanosis, or clubbing.  NEUROLOGIC: Cranial nerves II through XII are intact. Muscle strength 5/5 in all extremities. Sensation intact. Gait not checked.  PSYCHIATRIC: The patient is alert and oriented x 1.  SKIN: No obvious rash, lesion, or ulcer.   DATA REVIEWED:  LABORATORY PANEL:   CBC Recent Labs  Lab 12/01/18 1836  WBC 15.9*  HGB 13.9  HCT 42.8  PLT 239   ------------------------------------------------------------------------------------------------------------------  Chemistries  Recent Labs  Lab 12/01/18 1836  NA 138  K 4.5  CL 98  CO2 29  GLUCOSE 132*  BUN 43*  CREATININE 2.65*  CALCIUM 9.8  AST 46*  ALT 24  ALKPHOS 77   BILITOT 1.0   ------------------------------------------------------------------------------------------------------------------  Cardiac Enzymes No results for input(s): TROPONINI in the last 168 hours. ------------------------------------------------------------------------------------------------------------------  RADIOLOGY:  Ct Head Wo Contrast  Result Date: 12/01/2018 CLINICAL DATA:  Fall with trauma to the head.  Weakness. EXAM: CT HEAD WITHOUT CONTRAST TECHNIQUE: Contiguous axial images were obtained from the base of the skull through the vertex without intravenous contrast. COMPARISON:  MRI 05/06/2017 FINDINGS: Brain: Age related volume loss. Mild chronic small-vessel change of the cerebral hemispheric white matter. No sign of acute infarction, mass lesion, hemorrhage, hydrocephalus or extra-axial collection. Vascular: There is atherosclerotic calcification of the major vessels at the base of the brain. Skull: No skull fracture. Sinuses/Orbits: Clear/normal Other: None IMPRESSION: No acute or traumatic finding. Mild chronic small-vessel ischemic change of the cerebral hemispheric white matter considering age. Electronically Signed   By: Nelson Chimes M.D.   On: 12/01/2018  20:49   Dg Chest Port 1 View  Result Date: 12/01/2018 CLINICAL DATA:  Altered mental status, fever EXAM: PORTABLE CHEST 1 VIEW COMPARISON:  None. FINDINGS: Heart and mediastinal contours are within normal limits. No focal opacities or effusions. No acute bony abnormality. IMPRESSION: No active cardiopulmonary disease. Electronically Signed   By: Rolm Baptise M.D.   On: 12/01/2018 19:46    EKG:  EKG: normal EKG, normal sinus rhythm, unchanged from previous tracings. Vent. rate 69 BPM PR interval * ms QRS duration 73 ms QT/QTc 376/403 ms P-R-T axes 85 78 76 IMPRESSION AND PLAN:   85 y.o. female with pertinent past medical history of breast cancer, hyperlipidemia, PVD, GERD, hypertension, anxiety and depression,  diverticulosis, CKD stage III, and MCI presenting to the ED with abnormal labs from PCPs office and fall.  1. Sepsis - Patient meets SIRS criteria, temperature 100.2, with a leukocytosis.  Unclear source - Admit to MedSurg unit - Chest x-ray shows no evidence of active cardiopulmonary process - Covid negative - Check procalcitonin - UA shows evidence of UTI - Monitor fever curve trend WBC - Urine cultures pending - Blood cultures pending - Start Empiric abx with vancomycin, cefepime and Flagyl - IVFs and PRN bolus to keep MAP<44mmHg or SBP <46mmHg  2. AKI on CKD stage III -creatinine elevated above baseline - Likely prerenal - Hold nephrotoxins - IVFs hydration  3. Unsteady gait - high risk for recurrent falls - Falls precaution - PT/OT eval  4. Hypertension -now with borderline low blood pressure - Hold lisinopril -hydrochlorothiazide  5. HLD  + Goal LDL<100 - Pravastatin 40mg  PO qhs  6. Anxiety and depression - Continue Lyrica and Remeron - Continue clonazepam  6. DVT prophylaxis - Heparin SubQ   All the records are reviewed and case discussed with ED provider. Management plans discussed with the patient, family and they are in agreement.  CODE STATUS: FULL  TOTAL TIME TAKING CARE OF THIS PATIENT: 50 minutes.    on 12/01/2018 at 11:42 PM  Rufina Falco, DNP, FNP-BC [facility] Nurse Practitioner Between 7am to 6pm - Pager [phone]  After 6pm go to [email or web address] - password [facility]  Office  [phone]  CC: Primary care physician; Einar Pheasant, MD

## 2018-12-02 ENCOUNTER — Encounter: Payer: Self-pay | Admitting: *Deleted

## 2018-12-02 LAB — BASIC METABOLIC PANEL
Anion gap: 9 (ref 5–15)
BUN: 38 mg/dL — ABNORMAL HIGH (ref 8–23)
CO2: 28 mmol/L (ref 22–32)
Calcium: 8.1 mg/dL — ABNORMAL LOW (ref 8.9–10.3)
Chloride: 105 mmol/L (ref 98–111)
Creatinine, Ser: 2 mg/dL — ABNORMAL HIGH (ref 0.44–1.00)
GFR calc Af Amer: 26 mL/min — ABNORMAL LOW (ref >60)
GFR calc non Af Amer: 22 mL/min — ABNORMAL LOW (ref >60)
Glucose, Bld: 122 mg/dL — ABNORMAL HIGH (ref 70–99)
Potassium: 3.5 mmol/L (ref 3.5–5.1)
Sodium: 142 mmol/L (ref 135–145)

## 2018-12-02 LAB — CBC
HCT: 33.8 % — ABNORMAL LOW (ref 36.0–46.0)
Hemoglobin: 10.7 g/dL — ABNORMAL LOW (ref 12.0–15.0)
MCH: 29 pg (ref 26.0–34.0)
MCHC: 31.7 g/dL (ref 30.0–36.0)
MCV: 91.6 fL (ref 80.0–100.0)
Platelets: 186 10*3/uL (ref 150–400)
RBC: 3.69 MIL/uL — ABNORMAL LOW (ref 3.87–5.11)
RDW: 13.2 % (ref 11.5–15.5)
WBC: 8.9 10*3/uL (ref 4.0–10.5)
nRBC: 0 % (ref 0.0–0.2)

## 2018-12-02 LAB — PROTIME-INR
INR: 1.2 (ref 0.8–1.2)
Prothrombin Time: 15.4 seconds — ABNORMAL HIGH (ref 11.4–15.2)

## 2018-12-02 LAB — VANCOMYCIN, RANDOM: Vancomycin Rm: 27

## 2018-12-02 LAB — PROCALCITONIN: Procalcitonin: 1.18 ng/mL

## 2018-12-02 MED ORDER — SODIUM CHLORIDE 0.9 % IV SOLN
INTRAVENOUS | Status: DC
  Administered 2018-12-02 – 2018-12-03 (×2): via INTRAVENOUS

## 2018-12-02 NOTE — Telephone Encounter (Signed)
Already addressed.  Pt was sent to ER and admitted.

## 2018-12-02 NOTE — Progress Notes (Signed)
Pharmacy Antibiotic Note  Diana Lester is a 85 y.o. female admitted on 12/01/2018 with sepsis.  Pharmacy has been consulted for vanc/cefepime dosing.  Plan: 09/29 @ 0500 VR 27 mcg/mL above goal level of 20 mcg/mL. Will hold off vanc for now and will check a 12 hour level at 09/26 @ 1700 considering patient's renal function is improving and will recheck BMP w/ that random level. Will dose vanc 15 mg/kg IV x 1 if random comes back < 20 mcg/mL.   Will continue cefepime 2g IV q24h considering CrCl still between 11 - 29 ml/min. Will continue to monitor.  Height: 5\' 3"  (160 cm) Weight: 125 lb 3.5 oz (56.8 kg) IBW/kg (Calculated) : 52.4  Temp (24hrs), Avg:99.4 F (37.4 C), Min:98.4 F (36.9 C), Max:100.7 F (38.2 C)  Recent Labs  Lab 11/30/18 1703 12/01/18 1836 12/01/18 1837 12/01/18 1953 12/02/18 0440  WBC 14.3* 15.9*  --   --  8.9  CREATININE 2.50* 2.65*  --   --  2.00*  LATICACIDVEN  --   --  1.4 1.4  --   VANCORANDOM  --   --   --   --  27    Estimated Creatinine Clearance: 17 mL/min (A) (by C-G formula based on SCr of 2 mg/dL (H)).    Allergies  Allergen Reactions  . Contrast Media [Iodinated Diagnostic Agents] Hives    Thank you for allowing pharmacy to be a part of this patient's care.  Tobie Lords, PharmD, BCPS Clinical Pharmacist 12/02/2018 5:47 AM

## 2018-12-02 NOTE — Progress Notes (Signed)
Pharmacy Antibiotic Note  Diana Lester is a 85 y.o. female admitted on 12/01/2018 with sepsis.  Pharmacy has been consulted for vanc/cefepime dosing.  Plan: Patient received vanc 1.5g IV load and ceftriaxone 1g IV earlier in the ED  Patient is in AKI on CKD (bl Scr 1.4 - 1.5) will check random level w/ am labs and reassess. Goal random < 20 mcg/mL Will start cefepime 2g IV q24h per CrCl 11 - 29 ml/min will continue to monitor.  Height: 5\' 3"  (160 cm) Weight: 125 lb 3.5 oz (56.8 kg) IBW/kg (Calculated) : 52.4  Temp (24hrs), Avg:99.6 F (37.6 C), Min:98.4 F (36.9 C), Max:100.7 F (38.2 C)  Recent Labs  Lab 11/30/18 1703 12/01/18 1836 12/01/18 1837 12/01/18 1953  WBC 14.3* 15.9*  --   --   CREATININE 2.50* 2.65*  --   --   LATICACIDVEN  --   --  1.4 1.4    Estimated Creatinine Clearance: 12.8 mL/min (A) (by C-G formula based on SCr of 2.65 mg/dL (H)).    Allergies  Allergen Reactions  . Contrast Media [Iodinated Diagnostic Agents] Hives    Thank you for allowing pharmacy to be a part of this patient's care.  Tobie Lords, PharmD, BCPS Clinical Pharmacist 12/02/2018 1:36 AM

## 2018-12-02 NOTE — Telephone Encounter (Signed)
See lab result note. Son was notified of labs.  Addressed issues in my chart message.  Pt was sent to ER and admitted to [facility].

## 2018-12-02 NOTE — Progress Notes (Signed)
CODE SEPSIS - PHARMACY COMMUNICATION  **Broad Spectrum Antibiotics should be administered within 1 hour of Sepsis diagnosis**  Time Code Sepsis Called/Page Received: 2221  Antibiotics Ordered: ceftriaxone initially then vanc/cefepime  Time of 1st antibiotic administration: 2023  Additional action taken by pharmacy:   If necessary, Name of Provider/Nurse Contacted:     Tobie Lords ,PharmD Clinical Pharmacist  12/02/2018  1:28 AM

## 2018-12-02 NOTE — Progress Notes (Signed)
[place] [facility] [place] NAME: Diana Lester    MR#:  [id number]  DATE OF BIRTH:  08/12/1930  SUBJECTIVE:   Patient out in the chair eating breakfast. Denies any complaints. Patient has underlying cognitive decline. She lives at home by herself.  Spoke with son over the phone REVIEW OF SYSTEMS:   Review of Systems  Unable to perform ROS: Dementia   Tolerating Diet:yes Tolerating PT: pending  DRUG ALLERGIES:   Allergies  Allergen Reactions  . Contrast Media [Iodinated Diagnostic Agents] Hives    VITALS:  Blood pressure (!) 89/36, pulse 68, temperature 98.5 F (36.9 C), resp. rate 16, height 5\' 3"  (1.6 m), weight 56.8 kg, SpO2 95 %.  PHYSICAL EXAMINATION:   Physical Exam  GENERAL:  85 y.o.-year-old patient lying in the bed with no acute distress.  EYES: Pupils equal, round, reactive to light and accommodation. No scleral icterus. Extraocular muscles intact.  HEENT: Head atraumatic, normocephalic. Oropharynx and nasopharynx clear.  NECK:  Supple, no jugular venous distention. No thyroid enlargement, no tenderness.  LUNGS: Normal breath sounds bilaterally, no wheezing, rales, rhonchi. No use of accessory muscles of respiration.  CARDIOVASCULAR: S1, S2 normal. No murmurs, rubs, or gallops.  ABDOMEN: Soft, nontender, nondistended. Bowel sounds present. No organomegaly or mass.  EXTREMITIES: No cyanosis, clubbing or edema b/l.    NEUROLOGIC: Cranial nerves II through XII are intact. No focal Motor or sensory deficits b/l.  No focal weakness PSYCHIATRIC:  patient is alert and awake SKIN: No obvious rash, lesion, or ulcer.   LABORATORY PANEL:  CBC Recent Labs  Lab 12/02/18 0440  WBC 8.9  HGB 10.7*  HCT 33.8*  PLT 186    Chemistries  Recent Labs  Lab 12/01/18 1836 12/02/18 0440  NA 138 142  K 4.5 3.5  CL 98 105  CO2 29 28  GLUCOSE 132* 122*  BUN 43* 38*  CREATININE 2.65* 2.00*  CALCIUM 9.8 8.1*  AST 46*  --    ALT 24  --   ALKPHOS 77  --   BILITOT 1.0  --    Cardiac Enzymes No results for input(s): TROPONINI in the last 168 hours. RADIOLOGY:  Ct Head Wo Contrast  Result Date: 12/01/2018 CLINICAL DATA:  Fall with trauma to the head.  Weakness. EXAM: CT HEAD WITHOUT CONTRAST TECHNIQUE: Contiguous axial images were obtained from the base of the skull through the vertex without intravenous contrast. COMPARISON:  MRI 05/06/2017 FINDINGS: Brain: Age related volume loss. Mild chronic small-vessel change of the cerebral hemispheric white matter. No sign of acute infarction, mass lesion, hemorrhage, hydrocephalus or extra-axial collection. Vascular: There is atherosclerotic calcification of the major vessels at the base of the brain. Skull: No skull fracture. Sinuses/Orbits: Clear/normal Other: None IMPRESSION: No acute or traumatic finding. Mild chronic small-vessel ischemic change of the cerebral hemispheric white matter considering age. Electronically Signed   By: Nelson Chimes M.D.   On: 12/01/2018 20:49   Dg Chest Port 1 View  Result Date: 12/01/2018 CLINICAL DATA:  Altered mental status, fever EXAM: PORTABLE CHEST 1 VIEW COMPARISON:  None. FINDINGS: Heart and mediastinal contours are within normal limits. No focal opacities or effusions. No acute bony abnormality. IMPRESSION: No active cardiopulmonary disease. Electronically Signed   By: Rolm Baptise M.D.   On: 12/01/2018 19:46   ASSESSMENT AND PLAN:  85 y.o. female with pertinent past medical history of breast cancer, hyperlipidemia, PVD, GERD, hypertension, anxiety and depression, diverticulosis, CKD stage III,  and MCI presenting to the ED with abnormal labs from PCPs office and fall.  1.  SIRS criteria, temperature 100.2, with a leukocytosis.  Unclear source - Chest x-ray shows no evidence of active cardiopulmonary process - Covid negative - UA  Negative for UTI - Monitor fever curve trend WBC--no fever - Urine cultures not sent - Blood cultures  negative so far - Start Empiric abx with vancomycin, cefepime and Flagyl--d/c abxs - IVFs and PRN bolus to keep MAP<4mmHg or SBP <67mmHg  2. AKI on CKD stage III -creatinine elevated above baseline - Likely prerenal due to poor PO intake with cognitive decline and diuretics - Hold nephrotoxins - IVFs hydration -baseline creatinine 1.4 -name in with creatinine of 2.5---2 .6--- 2.0  3. Unsteady gait - high risk for recurrent falls - Falls precaution - PT/OT eval  4. Hypertension -now with borderline low blood pressure - Hold lisinopril -hydrochlorothiazide  5. HLD  + Goal LDL<100 - Pravastatin 40mg  PO qhs  6. Anxiety and depression - Continue Lyrica and Remeron - Continue clonazepam  6. DVT prophylaxis - Heparin SubQ   Spoke with patient's son on the phone. He is worried about patient staying at home alone given her cognitive decline and fall. Will get social worker involved in discharge planning.  Case discussed with Care Management/Social Worker. Management plans discussed with the patient, family and they are in agreement.  CODE STATUS: full  DVT Prophylaxis: heparin  TOTAL TIME TAKING CARE OF THIS PATIENT: *30* minutes.  >50% time spent on counselling and coordination of care  POSSIBLE D/C IN *2-3* DAYS, DEPENDING ON CLINICAL CONDITION.  Note: This dictation was prepared with Dragon dictation along with smaller phrase technology. Any transcriptional errors that result from this process are unintentional.  Fritzi Mandes M.D on 12/02/2018 at 12:20 PM  Between 7am to 6pm - Pager - [phone]  After 6pm go to [email or web address] - Proofreader  [facility]  Office  [phone]  CC: Primary care physician; Einar Pheasant, MDPatient ID: Diana Lester, female   DOB: 09-13-1930, 85 y.o.   MRN: [id number]

## 2018-12-02 NOTE — Evaluation (Signed)
Physical Therapy Evaluation Patient Details Name: Diana Lester MRN: [id number] DOB: 12-19-1930 Today's Date: 12/02/2018   History of Present Illness  From MD H&P: Pt is an 85 y.o. female with pertinent past medical history of breast cancer, hyperlipidemia, PVD, GERD, hypertension, anxiety and depression, diverticulosis, CKD stage III, and MCI presenting to the ED with abnormal labs and fall.  MD assessment includes: sepsis, UTI, AKI on CKD III, unsteady gait, HTN, HLD, and anxiety/depression.    Clinical Impression  Pt presented with min deficits in strength, transfers, gait, and balance but overall performed well during the session with SpO2 and HR WNL and with no adverse symptoms reported by the pt.  Pt was Ind with bed mobility tasks and CGA with transfers with verbal cues needed for proper sequencing.  Pt was able to amb with slow cadence 200+ feet with combination of RW and no AD with min drifting left/right when ambulating without the RW but did not require assistance to prevent LOB.  Pt with recent fall and per son who was present the pt has had "numerous" falls in the last 6 months.  Pt required significant assistance from the son while obtaining history/PLOF secondary to deficits in short term memory.  The pt's son communicated concern that the pt may not be eating/drinking well, taking her meds correctly, or demonstrating good safety awareness with mobility including the use of her RW.  Discussed with pt benefits of 24/hr supervision for the pt either at home or at a facility with son in agreement.  Pt will benefit from HHPT services upon discharge to safely address above deficits for decreased caregiver assistance, decreased risk of further functional decline, and decreased risk of future falls.      Follow Up Recommendations Home health PT;Supervision/Assistance - 24 hour    Equipment Recommendations  None recommended by PT    Recommendations for Other Services       Precautions  / Restrictions Precautions Precautions: Fall Restrictions Weight Bearing Restrictions: No      Mobility  Bed Mobility Overal bed mobility: Independent             General bed mobility comments: Pt able to perform sup to/from sit without physical assistance or the need of the bed rail  Transfers Overall transfer level: Needs assistance Equipment used: Rolling walker (2 wheeled) Transfers: Sit to/from Stand Sit to Stand: Min guard         General transfer comment: Mod verbal cues for proper sequencing with pt demonstrating good eccentric and concentric control  Ambulation/Gait Ambulation/Gait assistance: Min guard Gait Distance (Feet): 200 Feet Assistive device: Rolling walker (2 wheeled);None Gait Pattern/deviations: Step-through pattern;Decreased step length - right;Decreased step length - left;Drifts right/left Gait velocity: decreased   General Gait Details: Slow cadence amb 200+ feet with combination of RW and no AD with min drifting left/right when ambulating without the RW but did not require assistance to prevent LOB.  Stairs            Wheelchair Mobility    Modified Rankin (Stroke Patients Only)       Balance Overall balance assessment: Needs assistance Sitting-balance support: Bilateral upper extremity supported;Feet supported Sitting balance-Leahy Scale: Good     Standing balance support: No upper extremity supported;During functional activity Standing balance-Leahy Scale: Fair                               Pertinent Vitals/Pain Pain Assessment: No/denies  pain    Home Living Family/patient expects to be discharged to:: Private residence Living Arrangements: Alone Available Help at Discharge: Other (Comment)(No daily assistance available per son) Type of Home: House Home Access: Stairs to enter Entrance Stairs-Rails: None Entrance Stairs-Number of Steps: 2 Home Layout: One level Home Equipment: Walker - 2  wheels Additional Comments: History obtained from patient with assistance from pt's son at bedside secondary to pt being a poor historian    Prior Function Level of Independence: Independent         Comments: Ind amb community distances without an AD, "numerous" falls in the last 6 months per son, Ind with ADLs     Hand Dominance        Extremity/Trunk Assessment   Upper Extremity Assessment Upper Extremity Assessment: Generalized weakness    Lower Extremity Assessment Lower Extremity Assessment: Generalized weakness       Communication   Communication: No difficulties  Cognition Arousal/Alertness: Awake/alert Behavior During Therapy: WFL for tasks assessed/performed Overall Cognitive Status: History of cognitive impairments - at baseline                                        General Comments      Exercises Total Joint Exercises Ankle Circles/Pumps: Strengthening;Both;5 reps;10 reps Quad Sets: Strengthening;Both;10 reps Towel Squeeze: Strengthening;Both;10 reps Hip ABduction/ADduction: Strengthening;Both;10 reps Straight Leg Raises: AROM;Both;10 reps Long Arc Quad: Strengthening;Both;10 reps Knee Flexion: Strengthening;Both;10 reps Marching in Standing: AROM;Both;10 reps;Seated   Assessment/Plan    PT Assessment Patient needs continued PT services  PT Problem List Decreased strength;Decreased balance;Decreased knowledge of use of DME       PT Treatment Interventions DME instruction;Gait training;Stair training;Functional mobility training;Therapeutic activities;Balance training;Therapeutic exercise;Patient/family education    PT Goals (Current goals can be found in the Care Plan section)  Acute Rehab PT Goals Patient Stated Goal: Improved strength and balance PT Goal Formulation: With patient/family Time For Goal Achievement: 12/15/18 Potential to Achieve Goals: Good    Frequency Min 2X/week   Barriers to discharge         Co-evaluation               AM-PAC PT "6 Clicks" Mobility  Outcome Measure Help needed turning from your back to your side while in a flat bed without using bedrails?: None Help needed moving from lying on your back to sitting on the side of a flat bed without using bedrails?: None Help needed moving to and from a bed to a chair (including a wheelchair)?: A Little Help needed standing up from a chair using your arms (e.g., wheelchair or bedside chair)?: A Little Help needed to walk in hospital room?: A Little Help needed climbing 3-5 steps with a railing? : A Little 6 Click Score: 20    End of Session Equipment Utilized During Treatment: Gait belt Activity Tolerance: Patient tolerated treatment well Patient left: in chair;with chair alarm set;with family/visitor present;with call bell/phone within reach Nurse Communication: Mobility status;Other (comment)(Pt's BP 123/50) PT Visit Diagnosis: History of falling (Z91.81);Muscle weakness (generalized) (M62.81);Difficulty in walking, not elsewhere classified (R26.2)    Time: 1440-1516 PT Time Calculation (min) (ACUTE ONLY): 36 min   Charges:   PT Evaluation $PT Eval Low Complexity: 1 Low PT Treatments $Therapeutic Exercise: 8-22 mins        D. Royetta Asal PT, [name] 12/02/18, 4:16 PM

## 2018-12-03 LAB — CREATININE, SERUM
Creatinine, Ser: 1.46 mg/dL — ABNORMAL HIGH (ref 0.44–1.00)
GFR calc Af Amer: 38 mL/min — ABNORMAL LOW (ref >60)
GFR calc non Af Amer: 32 mL/min — ABNORMAL LOW (ref >60)

## 2018-12-03 LAB — CK: Total CK: 723 U/L — ABNORMAL HIGH (ref 38–234)

## 2018-12-03 MED ORDER — DIPHENHYDRAMINE HCL 12.5 MG/5ML PO ELIX
12.5000 mg | ORAL_SOLUTION | Freq: Once | ORAL | Status: AC
  Administered 2018-12-03: 12.5 mg via ORAL
  Filled 2018-12-03: qty 5

## 2018-12-03 NOTE — Progress Notes (Signed)
Patient having difficulty voiding, up to restroom several times throughout the night. Patient states she has to bear down to empty her bladder. Will relay message to day RN.

## 2018-12-03 NOTE — Progress Notes (Signed)
[name], female   DOB: 1930/04/18, 85 y.o.   MRN: [id number] CM d/w son Gaynel Schaafsma. Ok to go home with [facility] arrangements made by CM

## 2018-12-03 NOTE — Discharge Summary (Signed)
[place] at [place] NAME: Diana Lester    MR#:  [id number]  DATE OF BIRTH:  1930-06-24  DATE OF ADMISSION:  12/01/2018 ADMITTING PHYSICIAN: Lang Snow, NP  DATE OF DISCHARGE: 12/03/2018  PRIMARY CARE PHYSICIAN: Einar Pheasant, MD    ADMISSION DIAGNOSIS:  Generalized weakness [R53.1] Non-traumatic rhabdomyolysis [M62.82] Fever, unspecified fever cause [R50.9]  DISCHARGE DIAGNOSIS:   Acute on CKD-III due to poor po intake Dementia  SECONDARY DIAGNOSIS:   Past Medical History:  Diagnosis Date  . Arthritis   . Breast cancer ([place]) 1995   LT LUMPECTOMY & s/p XRT  . Depression   . Hx of colonic polyp   . Hypercholesterolemia   . Hypertension   . Melanoma ([facility] [place])   . Personal history of radiation therapy   . Spinal stenosis    lumbar    HOSPITAL COURSE:  85 y.o.femalewith pertinent past medical history ofbreast cancer, hyperlipidemia, PVD, GERD, hypertension, anxiety and depression, diverticulosis, CKD stage III, and MCI presenting to the ED withabnormal labs from PCPs office and fall.  1.AKI on CKD stage III-creatinine elevated above baseline -Likely prerenal due to poor PO intake with cognitive decline and diuretics -Hold nephrotoxins - IVFs hydration--d/c now -baseline creatinine 1.4 -came in with creatinine of 2.5---2 .6--- 2.0--1.4  2 SIRS criteria,temperature 100.2, with a leukocytosis.--resolved. No infectious source identified - Chest x-ray showsno evidence of active cardiopulmonary process - Covid negative - UA  Negative for UTI -Monitor fever curve trend WBC--no fever - Urine cultures not sent from ER - Blood cultures negative so far - -d/ced abxs  3. Unsteady gait - high risk for recurrent falls - Falls precaution - PT eval noted--HHPT  4.Hypertension-now with borderline low blood pressure -resumed  lisinopril-hydrochlorothiazide -defer to PCP to adjust BP meds at  home  5.HLD  + Goal LDL<100 -Pravastatin40mg  PO qhs  6.Anxiety and depression and worsening dementia (per PCP note and son) -Continue Lyrica and Remeron -Continue clonazepam -CM angela d/w son .--pt will d/c home with HHRN, PT,OT and aide   CONSULTS OBTAINED:    DRUG ALLERGIES:   Allergies  Allergen Reactions  . Contrast Media [Iodinated Diagnostic Agents] Hives    DISCHARGE MEDICATIONS:   Allergies as of 12/03/2018      Reactions   Contrast Media [iodinated Diagnostic Agents] Hives      Medication List    TAKE these medications   clonazePAM 0.5 MG tablet Commonly known as: KLONOPIN TAKE 1 TABLET(0.5 MG) BY MOUTH DAILY AS NEEDED FOR ANXIETY   lisinopril-hydrochlorothiazide 20-12.5 MG tablet Commonly known as: ZESTORETIC TAKE 1 TABLET BY MOUTH DAILY   lovastatin 40 MG tablet Commonly known as: MEVACOR TAKE 1 TO 2 TABLETS(40 TO 80 MG) BY MOUTH DAILY   mirtazapine 30 MG tablet Commonly known as: REMERON Take 1 tablet (30 mg total) by mouth at bedtime.   multivitamin tablet Take 1 tablet by mouth daily.   pregabalin 50 MG capsule Commonly known as: LYRICA Take 50 mg by mouth 2 (two) times daily.   traMADol 50 MG tablet Commonly known as: ULTRAM Take 50 mg by mouth 2 (two) times daily.       If you experience worsening of your admission symptoms, develop shortness of breath, life threatening emergency, suicidal or homicidal thoughts you must seek medical attention immediately by calling 911 or calling your MD immediately  if symptoms less severe.  You Must read complete instructions/literature along with all the possible adverse reactions/side effects  for all the Medicines you take and that have been prescribed to you. Take any new Medicines after you have completely understood and accept all the possible adverse reactions/side effects.   Please note  You were cared for by a hospitalist during your hospital stay. If you have any questions about  your discharge medications or the care you received while you were in the hospital after you are discharged, you can call the unit and asked to speak with the hospitalist on call if the hospitalist that took care of you is not available. Once you are discharged, your primary care physician will handle any further medical issues. Please note that NO REFILLS for any discharge medications will be authorized once you are discharged, as it is imperative that you return to your primary care physician (or establish a relationship with a primary care physician if you do not have one) for your aftercare needs so that they can reassess your need for medications and monitor your lab values.  DATA REVIEW:   CBC  Recent Labs  Lab 12/02/18 0440  WBC 8.9  HGB 10.7*  HCT 33.8*  PLT 186    Chemistries  Recent Labs  Lab 12/01/18 1836 12/02/18 0440 12/03/18 0523  NA 138 142  --   K 4.5 3.5  --   CL 98 105  --   CO2 29 28  --   GLUCOSE 132* 122*  --   BUN 43* 38*  --   CREATININE 2.65* 2.00* 1.46*  CALCIUM 9.8 8.1*  --   AST 46*  --   --   ALT 24  --   --   ALKPHOS 77  --   --   BILITOT 1.0  --   --     Microbiology Results   Recent Results (from the past 240 hour(s))  Blood Culture (routine x 2)     Status: None (Preliminary result)   Collection Time: 12/01/18  7:52 PM   Specimen: BLOOD  Result Value Ref Range Status   Specimen Description BLOOD LEFT ANTECUBITAL  Final   Special Requests   Final    BOTTLES DRAWN AEROBIC AND ANAEROBIC Blood Culture adequate volume   Culture   Final    NO GROWTH 2 DAYS Performed at [facility], [address] [place], [place] [address]    Report Status PENDING  Incomplete  SARS Coronavirus 2 [facility] order, Performed in [facility] hospital lab) Nasopharyngeal Nasopharyngeal Swab     Status: None   Collection Time: 12/01/18  7:53 PM   Specimen: Nasopharyngeal Swab  Result Value Ref Range Status   SARS Coronavirus 2 NEGATIVE NEGATIVE Final     Comment: (NOTE) If result is NEGATIVE SARS-CoV-2 target nucleic acids are NOT DETECTED. The SARS-CoV-2 RNA is generally detectable in upper and lower  respiratory specimens during the acute phase of infection. The lowest  concentration of SARS-CoV-2 viral copies this assay can detect is 250  copies / mL. A negative result does not preclude SARS-CoV-2 infection  and should not be used as the sole basis for treatment or other  patient management decisions.  A negative result may occur with  improper specimen collection / handling, submission of specimen other  than nasopharyngeal swab, presence of viral mutation(s) within the  areas targeted by this assay, and inadequate number of viral copies  (<250 copies / mL). A negative result must be combined with clinical  observations, patient history, and epidemiological information. If result is POSITIVE SARS-CoV-2  target nucleic acids are DETECTED. The SARS-CoV-2 RNA is generally detectable in upper and lower  respiratory specimens dur ing the acute phase of infection.  Positive  results are indicative of active infection with SARS-CoV-2.  Clinical  correlation with patient history and other diagnostic information is  necessary to determine patient infection status.  Positive results do  not rule out bacterial infection or co-infection with other viruses. If result is PRESUMPTIVE POSTIVE SARS-CoV-2 nucleic acids MAY BE PRESENT.   A presumptive positive result was obtained on the submitted specimen  and confirmed on repeat testing.  While 2019 novel coronavirus  (SARS-CoV-2) nucleic acids may be present in the submitted sample  additional confirmatory testing may be necessary for epidemiological  and / or clinical management purposes  to differentiate between  SARS-CoV-2 and other Sarbecovirus currently known to infect humans.  If clinically indicated additional testing with an alternate test  methodology [phone]) is advised. The  SARS-CoV-2 RNA is generally  detectable in upper and lower respiratory sp ecimens during the acute  phase of infection. The expected result is Negative. Fact Sheet for Patients:  StrictlyIdeas.no Fact Sheet for Healthcare Providers: BankingDealers.co.za This test is not yet approved or cleared by the [place] FDA and has been authorized for detection and/or diagnosis of SARS-CoV-2 by FDA under an Emergency Use Authorization (EUA).  This EUA will remain in effect (meaning this test can be used) for the duration of the COVID-19 declaration under Section 564(b)(1) of the Act, 21 U.S.C. section 360bbb-3(b)(1), unless the authorization is terminated or revoked sooner. Performed at [facility], [address] [place], [place] [address]   Blood Culture (routine x 2)     Status: None (Preliminary result)   Collection Time: 12/01/18  7:57 PM   Specimen: BLOOD  Result Value Ref Range Status   Specimen Description BLOOD LEFT HAND  Final   Special Requests   Final    BOTTLES DRAWN AEROBIC AND ANAEROBIC Blood Culture adequate volume   Culture   Final    NO GROWTH 2 DAYS Performed at [facility], [address] [place], [place] [address]    Report Status PENDING  Incomplete    RADIOLOGY:  Ct Head Wo Contrast  Result Date: 12/01/2018 CLINICAL DATA:  Fall with trauma to the head.  Weakness. EXAM: CT HEAD WITHOUT CONTRAST TECHNIQUE: Contiguous axial images were obtained from the base of the skull through the vertex without intravenous contrast. COMPARISON:  MRI 05/06/2017 FINDINGS: Brain: Age related volume loss. Mild chronic small-vessel change of the cerebral hemispheric white matter. No sign of acute infarction, mass lesion, hemorrhage, hydrocephalus or extra-axial collection. Vascular: There is atherosclerotic calcification of the major vessels at the base of the brain. Skull: No skull fracture. Sinuses/Orbits:  Clear/normal Other: None IMPRESSION: No acute or traumatic finding. Mild chronic small-vessel ischemic change of the cerebral hemispheric white matter considering age. Electronically Signed   By: Nelson Chimes M.D.   On: 12/01/2018 20:49   Dg Chest Port 1 View  Result Date: 12/01/2018 CLINICAL DATA:  Altered mental status, fever EXAM: PORTABLE CHEST 1 VIEW COMPARISON:  None. FINDINGS: Heart and mediastinal contours are within normal limits. No focal opacities or effusions. No acute bony abnormality. IMPRESSION: No active cardiopulmonary disease. Electronically Signed   By: Rolm Baptise M.D.   On: 12/01/2018 19:46     CODE STATUS:     Code Status Orders  (From admission, onward)         Start  Ordered   12/01/18 2223  Full code  Continuous     12/01/18 2225        Code Status History    Date Active Date Inactive Code Status Order ID Comments User Context   01/10/2016 0753 01/18/2016 1827 Full Code [id number]  Clayburn Pert, MD Inpatient   Advance Care Planning Activity      TOTAL TIME TAKING CARE OF THIS PATIENT: *40* minutes.    Fritzi Mandes M.D on 12/03/2018 at 10:24 AM  Between 7am to 6pm - Pager - [phone] After 6pm go to [email or web address] - password [facility]  Office  [phone]  CC: Primary care physician; Einar Pheasant, MD

## 2018-12-03 NOTE — TOC Transition Note (Signed)
Transition of Care [facility]) - CM/SW Discharge Note   Patient Details  Name: Diana Lester MRN: [id number] Date of Birth: Jul 10, 1930  [facility]) CM/SW Contact:  Marshell Garfinkel, RN Phone Number: 12/03/2018, 10:21 AM   Clinical Narrative:     RNCM spoke with patient by phone. She agrees that she will need some therapy and agrees to this being arranged.  [facility] home [facility] has accepted patient and will run insurance on Monday per [place]. Son [name] is contact as patient forgets. I spoke with son [name] and his wife [name] (she is a Education officer, museum). They are aware that PT is recommending 24/7 care however state that this is not possible. He states patient will not allow anyone to stay with her 24/7. Glyn Ade to reach out to APS, court system if patient's safety is a concern- he agreed.  He agrees to [facility] referral in hopes to arrange services with [facility] (contact number provided), and without preference of home health agencies.  He has been updated on [facility]. He will provide transportation to home today. He denies any other needs.   Final next level of care: [place] Barriers to Discharge: No Barriers Identified   Patient Goals and CMS Choice Patient states their goals for this hospitalization and ongoing recovery are:: Eager to return to home- agrees to home health CMS Medicare.gov Compare Post Acute Care list provided to:: Patient Choice offered to / list presented to : Patient, Adult Children(Son [name] [phone])  Discharge Placement                       Discharge Plan and Services     Post Acute Care Choice: Durable Medical Equipment                    HH Arranged: RN, PT, OT, Nurse's Aide, Social Work [employer] Agency: [place] Date [facility] Agency Contacted: 12/03/18 Time HH Agency Contacted: 37 Representative spoke with at [place]: Malachy Mood ROse  Social Determinants of Health ([place]) Interventions      Readmission Risk Interventions No flowsheet data found.

## 2018-12-03 NOTE — Progress Notes (Signed)
Bladder scanned patient after voiding to check for retention. Patient has 178cc's in bladder post void. Also, removed left IV peripheral access in AC, arm is red/ some swelling. Applied ice and prn tylenol 650mg  given for discomfort. Will report to oncoming RN for bladder retention.

## 2018-12-03 NOTE — Progress Notes (Signed)
[place] at [place] NAME: Diana Lester    MR#:  [id number]  DATE OF BIRTH:  [birth year]  SUBJECTIVE:   More awake and alert. Issues with short term memory. Forgetting easily Denies any complaints. Patient has underlying cognitive decline/worsenig dementia. She lives at home by herself.  Spoke with son over the phone REVIEW OF SYSTEMS:   Review of Systems  Unable to perform ROS: Dementia   Tolerating Diet:yes Tolerating PT: HHPT (walked 200 ft)  DRUG ALLERGIES:   Allergies  Allergen Reactions  . Contrast Media [Iodinated Diagnostic Agents] Hives    VITALS:  Blood pressure (!) 135/50, pulse 80, temperature 99.4 F (37.4 C), temperature source Oral, resp. rate 16, height 5\' 3"  (1.6 m), weight 56.8 kg, SpO2 97 %.  PHYSICAL EXAMINATION:   Physical Exam  GENERAL:  85 y.o.-year-old patient lying in the bed with no acute distress.  EYES: Pupils equal, round, reactive to light and accommodation. No scleral icterus. Extraocular muscles intact.  HEENT: Head atraumatic, normocephalic. Oropharynx and nasopharynx clear.  NECK:  Supple, no jugular venous distention. No thyroid enlargement, no tenderness.  LUNGS: Normal breath sounds bilaterally, no wheezing, rales, rhonchi. No use of accessory muscles of respiration.  CARDIOVASCULAR: S1, S2 normal. No murmurs, rubs, or gallops.  ABDOMEN: Soft, nontender, nondistended. Bowel sounds present. No organomegaly or mass.  EXTREMITIES: No cyanosis, clubbing or edema b/l.    NEUROLOGIC: Cranial nerves II through XII are intact. No focal Motor or sensory deficits b/l.  No focal weakness PSYCHIATRIC:  patient is alert and awake--memory issue SKIN: No obvious rash, lesion, or ulcer.   LABORATORY PANEL:  CBC Recent Labs  Lab 12/02/18 0440  WBC 8.9  HGB 10.7*  HCT 33.8*  PLT 186    Chemistries  Recent Labs  Lab 12/01/18 1836 12/02/18 0440 12/03/18 0523  NA 138 142  --   K 4.5  3.5  --   CL 98 105  --   CO2 29 28  --   GLUCOSE 132* 122*  --   BUN 43* 38*  --   CREATININE 2.65* 2.00* 1.46*  CALCIUM 9.8 8.1*  --   AST 46*  --   --   ALT 24  --   --   ALKPHOS 77  --   --   BILITOT 1.0  --   --    Cardiac Enzymes No results for input(s): TROPONINI in the last 168 hours. RADIOLOGY:  Ct Head Wo Contrast  Result Date: 12/01/2018 CLINICAL DATA:  Fall with trauma to the head.  Weakness. EXAM: CT HEAD WITHOUT CONTRAST TECHNIQUE: Contiguous axial images were obtained from the base of the skull through the vertex without intravenous contrast. COMPARISON:  MRI 05/06/2017 FINDINGS: Brain: Age related volume loss. Mild chronic small-vessel change of the cerebral hemispheric white matter. No sign of acute infarction, mass lesion, hemorrhage, hydrocephalus or extra-axial collection. Vascular: There is atherosclerotic calcification of the major vessels at the base of the brain. Skull: No skull fracture. Sinuses/Orbits: Clear/normal Other: None IMPRESSION: No acute or traumatic finding. Mild chronic small-vessel ischemic change of the cerebral hemispheric white matter considering age. Electronically Signed   By: Nelson Chimes M.D.   On: 12/01/2018 20:49   Dg Chest Port 1 View  Result Date: 12/01/2018 CLINICAL DATA:  Altered mental status, fever EXAM: PORTABLE CHEST 1 VIEW COMPARISON:  None. FINDINGS: Heart and mediastinal contours are within normal limits. No focal opacities or effusions. No acute bony  abnormality. IMPRESSION: No active cardiopulmonary disease. Electronically Signed   By: Rolm Baptise M.D.   On: 12/01/2018 19:46   ASSESSMENT AND PLAN:  85 y.o. female with pertinent past medical history of breast cancer, hyperlipidemia, PVD, GERD, hypertension, anxiety and depression, diverticulosis, CKD stage III, and MCI presenting to the ED with abnormal labs from PCPs office and fall.  1.  SIRS criteria, temperature 100.2, with a leukocytosis.--resolved. No infectious source  identified - Chest x-ray shows no evidence of active cardiopulmonary process - Covid negative - UA  Negative for UTI - Monitor fever curve trend WBC--no fever - Urine cultures not sent from ER - Blood cultures negative so far - -d/c abxs - IVFs and PRN bolus to keep MAP<83mmHg or SBP <92mmHg  2. AKI on CKD stage III -creatinine elevated above baseline - Likely prerenal due to poor PO intake with cognitive decline and diuretics - Hold nephrotoxins - IVFs hydration--d/c now -baseline creatinine 1.4 -name in with creatinine of 2.5---2 .6--- 2.0--1.4  3. Unsteady gait - high risk for recurrent falls - Falls precaution - PT eval noted--HHPT  4. Hypertension -now with borderline low blood pressure - Hold lisinopril -hydrochlorothiazide  5. HLD  + Goal LDL<100 - Pravastatin 40mg  PO qhs  6. Anxiety and depression and worsening dementia (per PCP note and son) - Continue Lyrica and Remeron - Continue clonazepam -CSW for d/c planning.   6. DVT prophylaxis - Heparin SubQ   Spoke with patient's son on the phone. He is worried about patient staying at home alone given her cognitive decline and fall. Will get social worker involved in discharge planning.  D/c once pt has safe d/c plans  CODE STATUS: full  DVT Prophylaxis: heparin  TOTAL TIME TAKING CARE OF THIS PATIENT: *30* minutes.  >50% time spent on counselling and coordination of care    Note: This dictation was prepared with Dragon dictation along with smaller phrase technology. Any transcriptional errors that result from this process are unintentional.  Diana Lester M.D on 12/03/2018 at 8:49 AM  Between 7am to 6pm - Pager - [phone]  After 6pm go to [email or web address] - Proofreader  [facility]  Office  [phone]  CC: Primary care physician; Einar Pheasant, MDPatient ID: Diana Lester, female   DOB: [birth year], 85 y.o.   MRN: [id number]

## 2018-12-03 NOTE — Plan of Care (Signed)

## 2018-12-04 ENCOUNTER — Telehealth: Payer: Self-pay

## 2018-12-04 ENCOUNTER — Other Ambulatory Visit: Payer: Self-pay | Admitting: Internal Medicine

## 2018-12-04 ENCOUNTER — Encounter: Payer: Self-pay | Admitting: Internal Medicine

## 2018-12-04 MED ORDER — CLONAZEPAM 0.5 MG PO TABS: ORAL_TABLET | ORAL | 0 refills | Status: DC

## 2018-12-04 NOTE — Telephone Encounter (Signed)
Noted.  Let me know if I need to do anything to order.

## 2018-12-04 NOTE — Telephone Encounter (Signed)
Pt taken to ED

## 2018-12-04 NOTE — Telephone Encounter (Signed)
Called patient to follow up with transitional care management. Spoke with her son Nathaneil Canary and sister, Jana Half (HIPAA compliant) Transitional care management is declined. Patient is following up with [facility], PT, OT and aide.

## 2018-12-05 ENCOUNTER — Encounter: Payer: Self-pay | Admitting: Internal Medicine

## 2018-12-05 NOTE — Telephone Encounter (Signed)
Advised son that we will send over new scripts. They have enough right now

## 2018-12-05 NOTE — Telephone Encounter (Signed)
Advised son of message below. They are pretty sure [facility] is where she will be placed. Will keep in touch through getting her placed.

## 2018-12-05 NOTE — Telephone Encounter (Signed)
Please call him and let him know that we have been unable to get on our computer system. Regarding the clonazepam - refill has been sent in.  We do recommend taking the cholesterol medication in the evening, but if it makes things easier (less confusing), she will be fine to take in the am.  I am not sure what paperwork specifically he is talking about.  Once they know a facility, I can complete the FL2 and any paperwork needed for that facility.

## 2018-12-06 LAB — CULTURE, BLOOD (ROUTINE X 2)
Culture: NO GROWTH
Culture: NO GROWTH
Special Requests: ADEQUATE
Special Requests: ADEQUATE

## 2018-12-06 NOTE — Telephone Encounter (Signed)
Pt is being placed at [facility]. I have just received FL2

## 2018-12-07 ENCOUNTER — Telehealth: Payer: Self-pay

## 2018-12-07 NOTE — Telephone Encounter (Signed)
Left message for son. Need to know if we are still going to use [facility] since pt will be moving to [facility]

## 2018-12-07 NOTE — Telephone Encounter (Signed)
Called to let patients son know that FL2 was received. She will be moving in next week. Advised that it may take a few days to complete form

## 2018-12-07 NOTE — Telephone Encounter (Signed)
Copied from [place] [phone]. Topic: General - Other >> Dec 07, 2018  3:06 PM Ivar Drape wrote: Reason for CRM:   Patient's son, Analaura Messler [phone], is returning Trish call.

## 2018-12-12 ENCOUNTER — Encounter: Payer: Self-pay | Admitting: Internal Medicine

## 2018-12-12 ENCOUNTER — Other Ambulatory Visit: Payer: Self-pay

## 2018-12-12 ENCOUNTER — Telehealth: Payer: Self-pay

## 2018-12-12 ENCOUNTER — Telehealth: Payer: Self-pay | Admitting: *Deleted

## 2018-12-12 MED ORDER — LISINOPRIL-HYDROCHLOROTHIAZIDE 20-12.5 MG PO TABS: 1.0000 | ORAL_TABLET | Freq: Every day | ORAL | 1 refills | Status: DC

## 2018-12-12 MED ORDER — MIRTAZAPINE 30 MG PO TABS: 30.0000 mg | ORAL_TABLET | Freq: Every day | ORAL | 0 refills | Status: DC

## 2018-12-12 MED ORDER — LOVASTATIN 40 MG PO TABS: ORAL_TABLET | ORAL | 0 refills | Status: DC

## 2018-12-12 NOTE — Telephone Encounter (Signed)
FL2 faxed and picked up by son

## 2018-12-12 NOTE — Progress Notes (Unsigned)
Needs sent to [facility] since she has been placed at [facility]. They will be sending blister packs to facility. I have already sent over the other meds that you fill

## 2018-12-12 NOTE — Telephone Encounter (Signed)
There was a my chart message regarding this.

## 2018-12-12 NOTE — Telephone Encounter (Signed)
Please call Diana Lester.  Have you discussed blister packs with them or do we need since she is going to assisted living.  Please clarify what is needed and notify pharmacy if blister packs are needed.

## 2018-12-12 NOTE — Telephone Encounter (Signed)
Mrs. Awanda Mink confirms [facility] uses [facility] in [place] all medications need to be in blister packs, sister would like this done today. Please call Hinton Dyer (niece) at [phone] when done.

## 2018-12-12 NOTE — Telephone Encounter (Signed)
Pt daughter in law called in and stated that they are moving pt in an hour and needs the [facility] asap before they can move her.  Please advise  Best number [phone]

## 2018-12-12 NOTE — Telephone Encounter (Signed)
Copied from [place] [id number]. Topic: General - Other >> Dec 12, 2018  1:43 PM Carolyn Stare wrote: Pt daugher in law called who is not on the dpr call to pt medication was suppose to be sent to [facility] for blister pack and has not been done and pt is moving into assissted living today

## 2018-12-12 NOTE — Progress Notes (Signed)
Clonazepam not needed. [facility] and son are both aware that her clonazepam was just refilled 9/28. He is aware that I have spoke with [facility] about her blister packs. Nothing further is needed at this time. Disregard other message I sent you on this pt. It is a duplicate.

## 2018-12-12 NOTE — Telephone Encounter (Signed)
Pt daughter in law called in and stated that they are moving pt in an hour and needs the [facility] asap before they can move her.  Please advise  Best number [phone]

## 2018-12-12 NOTE — Telephone Encounter (Signed)
Copied from [place] [id number]. Topic: General - Inquiry >> Dec 12, 2018  3:27 PM Virl Axe D wrote: Reason for CRM: Marissa with [facility] stated they need to know if pt has any allergies and if she has a special diet. Please advise. Cb#[phone]

## 2018-12-12 NOTE — Telephone Encounter (Signed)
Called [facility]. Gave her med list to them. They need the script for clonazepam. They will be doing her blister packs. Everything else was sent. They are sending notification to Dr Sharlet Salina to fill the tramadol and lyrica.

## 2018-12-12 NOTE — Progress Notes (Signed)
According [place] chart and [place] - we just sent in rx for #90 with no refills on 12/04/18 (clonazepam).  Also make sure family aware that pharmacy is aware.

## 2018-12-13 NOTE — Telephone Encounter (Signed)
Spoke with Marissa to clarify allergies. Spoke with Tarheel to clarify clonazepam. Pharmacist stated family member  Brought left over prescriptions to their pharmacy to put in blister packs since controlled drugs were just filled. Faxing over current med list so they can print label to put on blister pack. [name] is aware of all of this.

## 2018-12-13 NOTE — Telephone Encounter (Signed)
Left message with receptionist for Marissa or Horris Latino to call back

## 2018-12-13 NOTE — Telephone Encounter (Signed)
Marissa with [employer] returned [place] call and would like a callback [phone]

## 2018-12-14 NOTE — Telephone Encounter (Signed)
This was already taken care of.

## 2018-12-15 ENCOUNTER — Ambulatory Visit: Payer: PPO | Admitting: Internal Medicine

## 2018-12-18 DIAGNOSIS — F3340 Major depressive disorder, recurrent, in remission, unspecified: Secondary | ICD-10-CM | POA: Diagnosis not present

## 2018-12-18 DIAGNOSIS — M545 Low back pain: Secondary | ICD-10-CM | POA: Diagnosis not present

## 2018-12-18 DIAGNOSIS — N189 Chronic kidney disease, unspecified: Secondary | ICD-10-CM | POA: Diagnosis not present

## 2018-12-18 DIAGNOSIS — G8929 Other chronic pain: Secondary | ICD-10-CM | POA: Diagnosis not present

## 2018-12-18 DIAGNOSIS — M48061 Spinal stenosis, lumbar region without neurogenic claudication: Secondary | ICD-10-CM | POA: Diagnosis not present

## 2018-12-18 DIAGNOSIS — F411 Generalized anxiety disorder: Secondary | ICD-10-CM | POA: Diagnosis not present

## 2018-12-18 DIAGNOSIS — Z66 Do not resuscitate: Secondary | ICD-10-CM | POA: Diagnosis not present

## 2018-12-18 DIAGNOSIS — E782 Mixed hyperlipidemia: Secondary | ICD-10-CM | POA: Diagnosis not present

## 2018-12-21 DIAGNOSIS — Z20828 Contact with and (suspected) exposure to other viral communicable diseases: Secondary | ICD-10-CM | POA: Diagnosis not present

## 2018-12-22 ENCOUNTER — Ambulatory Visit
Admission: RE | Admit: 2018-12-22 | Discharge: 2018-12-22 | Disposition: A | Discharge status: Home / Self Care | Payer: PPO | Source: Ambulatory Visit | Attending: Internal Medicine | Admitting: Internal Medicine

## 2018-12-22 DIAGNOSIS — Z1231 Encounter for screening mammogram for malignant neoplasm of breast: Secondary | ICD-10-CM

## 2019-01-08 DIAGNOSIS — M545 Low back pain: Secondary | ICD-10-CM | POA: Diagnosis not present

## 2019-01-08 DIAGNOSIS — L89891 Pressure ulcer of other site, stage 1: Secondary | ICD-10-CM | POA: Diagnosis not present

## 2019-01-08 DIAGNOSIS — N189 Chronic kidney disease, unspecified: Secondary | ICD-10-CM | POA: Diagnosis not present

## 2019-01-08 DIAGNOSIS — F411 Generalized anxiety disorder: Secondary | ICD-10-CM | POA: Diagnosis not present

## 2019-01-08 DIAGNOSIS — L821 Other seborrheic keratosis: Secondary | ICD-10-CM | POA: Diagnosis not present

## 2019-01-09 ENCOUNTER — Telehealth: Payer: Self-pay

## 2019-01-09 NOTE — Telephone Encounter (Signed)
Copied from [place] [id number]. Topic: General - Inquiry >> Jan 09, 2019 12:10 PM Mathis Bud wrote: Reason for CRM: Truddie Crumble from [facility].  Called Stating he would like patients labs from 9/24 and up to be sent over to their office. Patient is switching offices and PCP Fax number [phone] Call back [phone]

## 2019-01-15 NOTE — Telephone Encounter (Signed)
Faxed to Dr making house calls.

## 2019-01-17 DIAGNOSIS — R4182 Altered mental status, unspecified: Secondary | ICD-10-CM | POA: Diagnosis not present

## 2019-01-17 DIAGNOSIS — R600 Localized edema: Secondary | ICD-10-CM | POA: Diagnosis not present

## 2019-01-18 DIAGNOSIS — R609 Edema, unspecified: Secondary | ICD-10-CM | POA: Diagnosis not present

## 2019-01-18 DIAGNOSIS — M79605 Pain in left leg: Secondary | ICD-10-CM | POA: Diagnosis not present

## 2019-01-19 ENCOUNTER — Emergency Department: Payer: PPO

## 2019-01-19 ENCOUNTER — Emergency Department
Admission: EM | Admit: 2019-01-19 | Discharge: 2019-01-19 | Disposition: A | Discharge status: Home / Self Care | Payer: PPO | Attending: Emergency Medicine | Admitting: Emergency Medicine

## 2019-01-19 ENCOUNTER — Other Ambulatory Visit: Payer: Self-pay

## 2019-01-19 DIAGNOSIS — Z7901 Long term (current) use of anticoagulants: Secondary | ICD-10-CM | POA: Diagnosis not present

## 2019-01-19 DIAGNOSIS — Z79899 Other long term (current) drug therapy: Secondary | ICD-10-CM | POA: Diagnosis not present

## 2019-01-19 DIAGNOSIS — R509 Fever, unspecified: Secondary | ICD-10-CM | POA: Diagnosis not present

## 2019-01-19 DIAGNOSIS — I82432 Acute embolism and thrombosis of left popliteal vein: Secondary | ICD-10-CM | POA: Diagnosis not present

## 2019-01-19 DIAGNOSIS — Z87891 Personal history of nicotine dependence: Secondary | ICD-10-CM | POA: Diagnosis not present

## 2019-01-19 DIAGNOSIS — N183 Chronic kidney disease, stage 3 unspecified: Secondary | ICD-10-CM | POA: Diagnosis not present

## 2019-01-19 DIAGNOSIS — M7989 Other specified soft tissue disorders: Secondary | ICD-10-CM | POA: Diagnosis present

## 2019-01-19 DIAGNOSIS — R609 Edema, unspecified: Secondary | ICD-10-CM | POA: Diagnosis not present

## 2019-01-19 DIAGNOSIS — I129 Hypertensive chronic kidney disease with stage 1 through stage 4 chronic kidney disease, or unspecified chronic kidney disease: Secondary | ICD-10-CM | POA: Diagnosis not present

## 2019-01-19 DIAGNOSIS — I824Y2 Acute embolism and thrombosis of unspecified deep veins of left proximal lower extremity: Secondary | ICD-10-CM

## 2019-01-19 DIAGNOSIS — Z85820 Personal history of malignant melanoma of skin: Secondary | ICD-10-CM | POA: Diagnosis not present

## 2019-01-19 DIAGNOSIS — N390 Urinary tract infection, site not specified: Secondary | ICD-10-CM | POA: Diagnosis not present

## 2019-01-19 DIAGNOSIS — Z853 Personal history of malignant neoplasm of breast: Secondary | ICD-10-CM | POA: Diagnosis not present

## 2019-01-19 DIAGNOSIS — I82412 Acute embolism and thrombosis of left femoral vein: Secondary | ICD-10-CM | POA: Diagnosis not present

## 2019-01-19 LAB — BASIC METABOLIC PANEL
Anion gap: 12 (ref 5–15)
BUN: 25 mg/dL — ABNORMAL HIGH (ref 8–23)
CO2: 25 mmol/L (ref 22–32)
Calcium: 9.6 mg/dL (ref 8.9–10.3)
Chloride: 101 mmol/L (ref 98–111)
Creatinine, Ser: 1.66 mg/dL — ABNORMAL HIGH (ref 0.44–1.00)
GFR calc Af Amer: 32 mL/min — ABNORMAL LOW (ref >60)
GFR calc non Af Amer: 28 mL/min — ABNORMAL LOW (ref >60)
Glucose, Bld: 112 mg/dL — ABNORMAL HIGH (ref 70–99)
Potassium: 4.3 mmol/L (ref 3.5–5.1)
Sodium: 138 mmol/L (ref 135–145)

## 2019-01-19 LAB — CBC
HCT: 40.1 % (ref 36.0–46.0)
Hemoglobin: 12.9 g/dL (ref 12.0–15.0)
MCH: 27.9 pg (ref 26.0–34.0)
MCHC: 32.2 g/dL (ref 30.0–36.0)
MCV: 86.8 fL (ref 80.0–100.0)
Platelets: 243 10*3/uL (ref 150–400)
RBC: 4.62 MIL/uL (ref 3.87–5.11)
RDW: 13.5 % (ref 11.5–15.5)
WBC: 10.5 10*3/uL (ref 4.0–10.5)
nRBC: 0 % (ref 0.0–0.2)

## 2019-01-19 LAB — APTT: aPTT: 26 seconds (ref 24–36)

## 2019-01-19 LAB — PROTIME-INR
INR: 1 (ref 0.8–1.2)
Prothrombin Time: 13.5 seconds (ref 11.4–15.2)

## 2019-01-19 MED ORDER — APIXABAN 2.5 MG PO TABS: 2.5000 mg | ORAL_TABLET | Freq: Two times a day (BID) | ORAL | 0 refills | Status: DC

## 2019-01-19 MED ORDER — APIXABAN 5 MG PO TABS
5.0000 mg | ORAL_TABLET | ORAL | Status: AC
  Administered 2019-01-19: 5 mg via ORAL
  Filled 2019-01-19: qty 1

## 2019-01-19 NOTE — ED Provider Notes (Signed)
[facility] Emergency Department Provider Note ____________________________________________   First MD Initiated Contact with Patient 01/19/19 1231     (approximate)  I have reviewed the triage vital signs and the nursing notes.  HISTORY  Chief Complaint Leg Swelling  HPI Diana Lester is a 85 y.o. female past medical history hypertension hypercholesterolemia  Patient presents today for left leg swelling  Patient noticed a couple days ago that her left leg was swollen.  Spoke to her doctor at [facility] who ordered an ultrasound, this came back for positive DVT.  Patient was referred to the ER for further evaluation as last night she had an ultrasound that resulted with DVT in the left leg  Denies swelling light right leg.  She is never had any problems like this before.  No history of blood clots.  Does not take any blood thinners at all.  She is never had any bleeding problems.  She does not have a history of recent or frequent falling.  She is here with her sister who also assists in her medical care denies pain weakness or numbness involving either of the legs.   Past Medical History:  Diagnosis Date  . Arthritis   . Breast cancer ([place]) 1995   LT LUMPECTOMY & s/p XRT  . Depression   . Hx of colonic polyp   . Hypercholesterolemia   . Hypertension   . Melanoma ([place])   . Personal history of radiation therapy   . Spinal stenosis    lumbar    Patient Active Problem List   Diagnosis Date Noted  . Memory change 12/01/2018  . Unsteady gait 12/01/2018  . Sepsis ([place]) 12/01/2018  . Elevated hemoglobin ([place]) 07/23/2018  . Right knee pain 03/11/2018  . Urinary frequency 06/25/2017  . Lumbar spinal stenosis 01/23/2016  . Hypertension 01/23/2016  . History of depression 01/23/2016  . 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 Right lower quadrant abdominal pain   . Anxiety 10/20/2015  . Loss of weight 10/20/2015  . Hyponatremia  08/07/2014  . Health care maintenance 05/05/2014  . Skin cancer 01/29/2014  . Other malaise and fatigue 10/21/2013  . Skin lesion of cheek 10/21/2013  . Renal cyst 03/22/2013  . Chronic kidney disease, stage III (moderate) 03/14/2013  . Abdominal bruit 03/14/2013  . Spinal stenosis of lumbar region 12/09/2012  . Bulging disc 12/09/2012  . History of colonic polyps 12/09/2012  . History of breast cancer 12/09/2012  . Mild depression ([place]) 12/09/2012  . Essential hypertension, benign 12/09/2012  . Hyperlipidemia 12/09/2012    Past Surgical History:  Procedure Laterality Date  . BREAST BIOPSY Left 2006   EXCISIONAL - NEG  . BREAST BIOPSY Left 1995   LT LUMPECTOMY FOR CA  . BREAST LUMPECTOMY Left 1995  . EYE SURGERY Bilateral    cataract excision  . LAPAROSCOPIC APPENDECTOMY N/A 01/11/2016   Procedure: APPENDECTOMY LAPAROSCOPIC;  Surgeon: Dia Crawford III, MD;  Location: [facility] ORS;  Service: General;  Laterality: N/A;    Prior to Admission medications   Medication Sig Start Date End Date Taking? Authorizing Provider  apixaban (ELIQUIS) 2.5 MG TABS tablet Take 1 tablet (2.5 mg total) by mouth 2 (two) times daily. 01/19/19   Delman Kitten, MD  clonazePAM (KLONOPIN) 0.5 MG tablet TAKE 1 TABLET(0.5 MG) BY MOUTH DAILY AS NEEDED FOR ANXIETY 12/04/18   Einar Pheasant, MD  lisinopril-hydrochlorothiazide (ZESTORETIC) 20-12.5 MG tablet Take 1 tablet by mouth daily. 12/12/18   Einar Pheasant, MD  lovastatin (MEVACOR) 40 MG tablet TAKE 1 TO 2 TABLETS(40 TO 80 MG) BY MOUTH DAILY 12/12/18   Einar Pheasant, MD  mirtazapine (REMERON) 30 MG tablet Take 1 tablet (30 mg total) by mouth at bedtime. 12/12/18   Einar Pheasant, MD  Multiple Vitamin (MULTIVITAMIN) tablet Take 1 tablet by mouth daily.    [provider]  pregabalin (LYRICA) 50 MG capsule Take 50 mg by mouth 2 (two) times daily.     [provider]  traMADol (ULTRAM) 50 MG tablet Take 50 mg by mouth 2 (two) times daily. 11/07/18    [provider]    Allergies Contrast media [iodinated diagnostic agents]  Family History  Problem Relation Age of Onset  . Arthritis Mother   . Hyperlipidemia Mother   . Hypertension Mother   . Hyperlipidemia Sister   . Kidney cancer Sister   . Breast cancer Neg Hx     Social History Social History   Tobacco Use  . Smoking status: Former Smoker    Packs/day: 0.50    Years: 4.00    Pack years: 2.00    Types: Cigarettes    Quit date: 12/09/1984    Years since quitting: 34.1  . Smokeless tobacco: Never Used  . Tobacco comment: quit 12/09/84  Substance Use Topics  . Alcohol use: No    Alcohol/week: 0.0 standard drinks    Comment: sparingly  . Drug use: No    Review of Systems Constitutional: No fever/chills or recent illness.  No Covid exposure. Eyes: No visual changes. ENT: No sore throat. Cardiovascular: Denies chest pain. Respiratory: Denies shortness of breath. Gastrointestinal: No abdominal pain.   Genitourinary: Negative for dysuria. Musculoskeletal: Negative for back pain.  See HPI. Skin: Negative for rash. Neurological: Negative for weakness or numbness.    ____________________________________________   PHYSICAL EXAM:  VITAL SIGNS: ED Triage Vitals  Enc Vitals Group     BP 01/19/19 1127 130/72     Pulse Rate 01/19/19 1127 81     Resp 01/19/19 1133 18     Temp 01/19/19 1127 98.8 F (37.1 C)     Temp Source 01/19/19 1127 Oral     SpO2 01/19/19 1127 97 %     Weight 01/19/19 1131 130 lb (59 kg)     Height 01/19/19 1131 5\' 4"  (1.626 m)     Head Circumference --      Peak Flow --      Pain Score 01/19/19 1131 0     Pain Loc --      Pain Edu? --      Excl. in [place]? --    Constitutional: Alert and oriented. Well appearing and in no acute distress.  She and her sister very pleasant. Eyes: Conjunctivae are normal. Head: Atraumatic. Nose: No congestion/rhinnorhea. Mouth/Throat: Mucous membranes are moist. Neck: No stridor.   Cardiovascular: Normal rate, regular rhythm. Grossly normal heart sounds.  Good peripheral circulation. Respiratory: Normal respiratory effort.  No retractions. Lungs CTAB. Gastrointestinal: Soft and nontender. No distention. Musculoskeletal: No lower extremity tenderness nor edema.  Good use of lower extremities bilateral.  5-5 strength.  Right lower extremity appears normal without swelling or edema.  Left lower extremity with moderate to slight pitting edema around the ankle and mid thigh region.  No venous cords or congestion.  Palpable dorsalis pedis and posterior tibial pulses lower extremities bilaterally. Neurologic:  Normal speech and language. No gross focal neurologic deficits are appreciated.  Skin:  Skin is warm, dry and intact.  No rash noted. Psychiatric: Mood and affect are normal. Speech and behavior are normal.  ____________________________________________   LABS (all labs ordered are listed, but only abnormal results are displayed)  Labs Reviewed  BASIC METABOLIC PANEL - Abnormal; Notable for the following components:      Result Value   Glucose, Bld 112 (*)    BUN 25 (*)    Creatinine, Ser 1.66 (*)    GFR calc non Af Amer 28 (*)    GFR calc Af Amer 32 (*)    All other components within normal limits  PROTIME-INR  APTT  CBC   ____________________________________________  EKG   ____________________________________________  RADIOLOGY    US Venous Img Lower Unilateral Left  Result Date: 01/19/2019 CLINICAL DATA:  Left lower extremity edema. EXAM: LEFT LOWER EXTREMITY VENOUS DOPPLER ULTRASOUND TECHNIQUE: Gray-scale sonography with graded compression, as well as color Doppler and duplex ultrasound were performed to evaluate the lower extremity deep venous systems from the level of the common femoral vein and including the common femoral, femoral, profunda femoral, popliteal and calf veins including the posterior tibial, peroneal and gastrocnemius veins when  visible. The superficial great saphenous vein was also interrogated. Spectral Doppler was utilized to evaluate flow at rest and with distal augmentation maneuvers in the common femoral, femoral and popliteal veins. COMPARISON:  None. FINDINGS: Contralateral Common Femoral Vein: Respiratory phasicity is normal and symmetric with the symptomatic side. No evidence of thrombus. Normal compressibility. Common Femoral Vein: Occlusive thrombus present. Saphenofemoral Junction: Thrombus extends into the saphenofemoral junction. Profunda Femoral Vein: Occlusive thrombus present. Femoral Vein: Occlusive thrombus present. Popliteal Vein: Occlusive thrombus present. Calf Veins: No evidence of thrombus. Normal compressibility and flow on color Doppler imaging. Superficial Great Saphenous Vein: Thrombus in the great saphenous vein of the proximal thigh near the saphenofemoral junction. Venous Reflux:  None. Other Findings:  No abnormal fluid collections. IMPRESSION: Extensive occlusive DVT involving the left common femoral vein, femoral vein and popliteal vein. There is some thrombus extending into the GSV at the level of the saphenofemoral junction. The tibial veins of the calf appear normally patent. Electronically Signed   By: Aletta Edouard M.D.   On: 01/19/2019 13:09    Ultrasound reviewed positive for extensive left leg DVT ____________________________________________   PROCEDURES  Procedure(s) performed: None  Procedures  Critical Care performed: No  ____________________________________________   INITIAL IMPRESSION / ASSESSMENT AND PLAN / ED COURSE  Pertinent labs & imaging results that were available during my care of the patient were reviewed by me and considered in my medical decision making (see chart for details).   Patient comes for evaluation of left lower leg swelling, reportedly suspected DVT on ultrasound last night.  Denies history of falls, bleeding, GI bleeding, or other concerning risk  factors that would highlight strong need to avoid anticoagulation for DVT.  No evidence of superinfection or complication from DVT.  No obvious eliciting factor.  Labs and renal function reviewed.  Patient seen and evaluated by vascular surgery team, appreciate consult in the ER.  Vascular surgery recommending treatment with Eliquis reduced dose 2-1/2 mg twice daily and will follow up with them in 1 week.    Clinical Course as of Jan 19 1428  Fri Jan 19, 2019  1400 K Stagmeyer at bedside for vascular consult.   [MQ]    Clinical Course User Index [MQ] Delman Kitten, MD    Return precautions and treatment recommendations and follow-up discussed with the patient who is agreeable with the  plan.  Patient and her sister who is involved in her care agreeable with plan, and will following up closely with [facility] as well as filling her prescription which I have provided as printed form.  Originally sent to [facility], but family notes that they use [facility] and would prefer printed copy which I have given them.  ____________________________________________   FINAL CLINICAL IMPRESSION(S) / ED DIAGNOSES  Final diagnoses:  Acute deep vein thrombosis (DVT) of proximal vein of left lower extremity ([place])        Note:  This document was prepared using Dragon voice recognition software and may include unintentional dictation errors       Delman Kitten, MD 01/19/19 1430

## 2019-01-19 NOTE — ED Notes (Signed)
See triage note  Presents from [facility] with family    Has had pain and swelling to left leg  The doppler at the nsg was positive for clot

## 2019-01-19 NOTE — ED Triage Notes (Signed)
Reports left leg swelling that started a few days ago, denies pain. Swelling present upon triage assessment. States that she lives at [facility] and they did a "doppler exam last night" that was positive for blood clot. Denies SOB. Pt alert and oriented X4, cooperative, RR even and unlabored, color WNL. Pt in NAD.

## 2019-01-19 NOTE — Consult Note (Signed)
[facility] Vascular Consult Note  MRN : [id number]  Diana Lester is a 85 y.o. (Feb 06, 1931) female who presents with chief complaint of  Chief Complaint  Patient presents with  . Leg Swelling   History of Present Illness:  The patient is an 85 year old female with multiple medical issues including spinal stenosis, hypertension, hypercholesteremia, depression, chronic kidney disease stage III, breast cancer 1995 status post left lumpectomy and radiation who presented to the [facility] emergency department with one day of left lower extremity swelling.  Patient seen and examined with her daughter at the bedside.  Patient notes progressively worsening swelling to left lower extremity for last 24 hours which prompted her to seek medical attention.  Notes minimal pain to the extremity.  Denies any history of surgery, trauma, prolonged immobility, or bleeding/clotting disorders.  Patient denies any chest pain or shortness of breath.  Patient denies any family history or personal history of DVT or PE in the past.  Venous Duplex (01/19/19):  Extensive occlusive DVT involving the left common femoral vein, femoral vein and popliteal vein. There is some thrombus extending into the GSV at the level of the saphenofemoral junction. The tibial veins of the calf appear normally patent.  Vascular surgery was consulted by Dr. Jacqualine Code for further recommendations Current Facility-Administered Medications  Medication Dose Route Frequency Provider Last Rate Last Dose  . apixaban (ELIQUIS) tablet 5 mg  5 mg Oral STAT Delman Kitten, MD       Current Outpatient Medications  Medication Sig Dispense Refill  . apixaban (ELIQUIS) 2.5 MG TABS tablet Take 1 tablet (2.5 mg total) by mouth 2 (two) times daily. 60 tablet 0  . clonazePAM (KLONOPIN) 0.5 MG tablet TAKE 1 TABLET(0.5 MG) BY MOUTH DAILY AS NEEDED FOR ANXIETY 90 tablet 0  . lisinopril-hydrochlorothiazide  (ZESTORETIC) 20-12.5 MG tablet Take 1 tablet by mouth daily. 90 tablet 1  . lovastatin (MEVACOR) 40 MG tablet TAKE 1 TO 2 TABLETS(40 TO 80 MG) BY MOUTH DAILY 180 tablet 0  . mirtazapine (REMERON) 30 MG tablet Take 1 tablet (30 mg total) by mouth at bedtime. 90 tablet 0  . Multiple Vitamin (MULTIVITAMIN) tablet Take 1 tablet by mouth daily.    . pregabalin (LYRICA) 50 MG capsule Take 50 mg by mouth 2 (two) times daily.     . traMADol (ULTRAM) 50 MG tablet Take 50 mg by mouth 2 (two) times daily.     Past Medical History:  Diagnosis Date  . Arthritis   . Breast cancer ([place]) 1995   LT LUMPECTOMY & s/p XRT  . Depression   . Hx of colonic polyp   . Hypercholesterolemia   . Hypertension   . Melanoma ([place])   . Personal history of radiation therapy   . Spinal stenosis    lumbar   Past Surgical History:  Procedure Laterality Date  . BREAST BIOPSY Left 2006   EXCISIONAL - NEG  . BREAST BIOPSY Left 1995   LT LUMPECTOMY FOR CA  . BREAST LUMPECTOMY Left 1995  . EYE SURGERY Bilateral    cataract excision  . LAPAROSCOPIC APPENDECTOMY N/A 01/11/2016   Procedure: APPENDECTOMY LAPAROSCOPIC;  Surgeon: Dia Crawford III, MD;  Location: [facility] ORS;  Service: General;  Laterality: N/A;   Social History Social History   Tobacco Use  . Smoking status: Former Smoker    Packs/day: 0.50    Years: 4.00    Pack years: 2.00    Types: Cigarettes  Quit date: 12/09/1984    Years since quitting: 34.1  . Smokeless tobacco: Never Used  . Tobacco comment: quit 12/09/84  Substance Use Topics  . Alcohol use: No    Alcohol/week: 0.0 standard drinks    Comment: sparingly  . Drug use: No   Family History Family History  Problem Relation Age of Onset  . Arthritis Mother   . Hyperlipidemia Mother   . Hypertension Mother   . Hyperlipidemia Sister   . Kidney cancer Sister   . Breast cancer Neg Hx   Denies family history of peripheral artery disease, venous disease or bleeding/clotting disorders.  Allergies   Allergen Reactions  . Contrast Media [Iodinated Diagnostic Agents] Hives   REVIEW OF SYSTEMS (Negative unless checked)  Constitutional: [] Weight loss  [] Fever  [] Chills Cardiac: [] Chest pain   [] Chest pressure   [] Palpitations   [] Shortness of breath when laying flat   [] Shortness of breath at rest   [] Shortness of breath with exertion. Vascular:  [] Pain in legs with walking   [] Pain in legs at rest   [] Pain in legs when laying flat   [] Claudication   [] Pain in feet when walking  [] Pain in feet at rest  [] Pain in feet when laying flat   [] History of DVT   [] Phlebitis   [x] Swelling in legs   [] Varicose veins   [] Non-healing ulcers Pulmonary:   [] Uses home oxygen   [] Productive cough   [] Hemoptysis   [] Wheeze  [] COPD   [] Asthma Neurologic:  [] Dizziness  [] Blackouts   [] Seizures   [] History of stroke   [] History of TIA  [] Aphasia   [] Temporary blindness   [] Dysphagia   [] Weakness or numbness in arms   [] Weakness or numbness in legs Musculoskeletal:  [] Arthritis   [] Joint swelling   [] Joint pain   [] Low back pain Hematologic:  [] Easy bruising  [] Easy bleeding   [] Hypercoagulable state   [] Anemic  [] Hepatitis Gastrointestinal:  [] Blood in stool   [] Vomiting blood  [] Gastroesophageal reflux/heartburn   [] Difficulty swallowing. Genitourinary:  [x] Chronic kidney disease   [] Difficult urination  [] Frequent urination  [] Burning with urination   [] Blood in urine Skin:  [] Rashes   [] Ulcers   [] Wounds Psychological:  [] History of anxiety   [x]  History of major depression.  Physical Examination  Vitals:   01/19/19 1127 01/19/19 1131 01/19/19 1133  BP: 130/72    Pulse: 81    Resp:   18  Temp: 98.8 F (37.1 C)    TempSrc: Oral    SpO2: 97%    Weight:  59 kg   Height:  5\' 4"  (1.626 m)    Body mass index is 22.31 kg/m. Gen:  WD/WN, NAD Head: [place]/AT, No temporalis wasting. Prominent temp pulse not noted. Ear/Nose/Throat: Hearing grossly intact, nares w/o erythema or drainage, oropharynx w/o  Erythema/Exudate Eyes: Sclera non-icteric, conjunctiva clear Neck: Trachea midline.  No JVD.  Pulmonary:  Good air movement, respirations not labored, equal bilaterally.  Cardiac: RRR, normal S1, S2. Vascular:  Vessel Right Left  Radial Palpable Palpable  Ulnar Palpable Palpable  Brachial Palpable Palpable  Carotid Palpable, without bruit Palpable, without bruit  Aorta Not palpable N/A  Femoral Palpable Palpable  Popliteal Palpable Palpable  PT Palpable Palpable  DP Palpable Palpable   Left Lower Extremity: Thigh soft, calf soft.  Extremities warm distally to toes.  Hard to palpate pedal pulses however the foot is warm with good capillary refill.  No compartment syndrome noted.  Motor/sensory to left foot is intact.  No cellulitis.  Mild to moderate edema.  No pain with dorsiflexion or palpation.  Gastrointestinal: soft, non-tender/non-distended. No guarding/reflex.  Musculoskeletal: M/S 5/5 throughout.  Extremities without ischemic changes.  No deformity or atrophy.  Neurologic: Sensation grossly intact in extremities.  Symmetrical.  Speech is fluent. Motor exam as listed above. Psychiatric: Judgment intact, Mood & affect appropriate for pt's clinical situation. Dermatologic: No rashes or ulcers noted.  No cellulitis or open wounds. Lymph : No Cervical, Axillary, or Inguinal lymphadenopathy.  CBC Lab Results  Component Value Date   WBC 10.5 01/19/2019   HGB 12.9 01/19/2019   HCT 40.1 01/19/2019   MCV 86.8 01/19/2019   PLT 243 01/19/2019   BMET    Component Value Date/Time   NA 138 01/19/2019 1257   K 4.3 01/19/2019 1257   CL 101 01/19/2019 1257   CO2 25 01/19/2019 1257   GLUCOSE 112 (H) 01/19/2019 1257   BUN 25 (H) 01/19/2019 1257   CREATININE 1.66 (H) 01/19/2019 1257   CREATININE 1.46 (H) 11/22/2017 1340   CALCIUM 9.6 01/19/2019 1257   GFRNONAA 28 (L) 01/19/2019 1257   GFRAA 32 (L) 01/19/2019 1257   Estimated Creatinine Clearance: 21.4 mL/min (A) (by C-G formula  based on SCr of 1.66 mg/dL (H)).  COAG Lab Results  Component Value Date   INR 1.0 01/19/2019   INR 1.2 12/02/2018   Radiology US Venous Img Lower Unilateral Left  Result Date: 01/19/2019 CLINICAL DATA:  Left lower extremity edema. EXAM: LEFT LOWER EXTREMITY VENOUS DOPPLER ULTRASOUND TECHNIQUE: Gray-scale sonography with graded compression, as well as color Doppler and duplex ultrasound were performed to evaluate the lower extremity deep venous systems from the level of the common femoral vein and including the common femoral, femoral, profunda femoral, popliteal and calf veins including the posterior tibial, peroneal and gastrocnemius veins when visible. The superficial great saphenous vein was also interrogated. Spectral Doppler was utilized to evaluate flow at rest and with distal augmentation maneuvers in the common femoral, femoral and popliteal veins. COMPARISON:  None. FINDINGS: Contralateral Common Femoral Vein: Respiratory phasicity is normal and symmetric with the symptomatic side. No evidence of thrombus. Normal compressibility. Common Femoral Vein: Occlusive thrombus present. Saphenofemoral Junction: Thrombus extends into the saphenofemoral junction. Profunda Femoral Vein: Occlusive thrombus present. Femoral Vein: Occlusive thrombus present. Popliteal Vein: Occlusive thrombus present. Calf Veins: No evidence of thrombus. Normal compressibility and flow on color Doppler imaging. Superficial Great Saphenous Vein: Thrombus in the great saphenous vein of the proximal thigh near the saphenofemoral junction. Venous Reflux:  None. Other Findings:  No abnormal fluid collections. IMPRESSION: Extensive occlusive DVT involving the left common femoral vein, femoral vein and popliteal vein. There is some thrombus extending into the GSV at the level of the saphenofemoral junction. The tibial veins of the calf appear normally patent. Electronically Signed   By: Aletta Edouard M.D.   On: 01/19/2019 13:09    Mm 3d Screen Breast Bilateral  Result Date: 12/25/2018 CLINICAL DATA:  Screening. EXAM: DIGITAL SCREENING BILATERAL MAMMOGRAM WITH TOMO AND CAD COMPARISON:  Previous exam(s). ACR Breast Density Category b: There are scattered areas of fibroglandular density. FINDINGS: There are no findings suspicious for malignancy. Images were processed with CAD. IMPRESSION: No mammographic evidence of malignancy. A result letter of this screening mammogram will be mailed directly to the patient. RECOMMENDATION: Screening mammogram in one year. (Code:SM-B-01Y) BI-RADS CATEGORY  1: Negative. Electronically Signed   By: Franki Cabot M.D.   On: 12/25/2018 11:33   Assessment/Plan The patient is an  85 year old female with multiple medical issues including spinal stenosis, hypertension, hypercholesteremia, depression, chronic kidney disease stage III, breast cancer 1995 status post left lumpectomy and radiation who presented to the [facility] emergency department with one day of left lower extremity swelling. 1. Left Lower Extremity DVT: Patient presents with what seems like an unprovoked left lower extremity DVT as she denies any recent surgery, trauma, prolonged mobility, past DVT history or known bleeding/clotting disorders.  Patient with minimal discomfort to the left extremity.  Mild to moderate edema with no acute vascular compromise noted.  DVT is extensive on duplex however with a relatively unremarkable exam including some mild to moderate edema would not recommend venous thrombolysis or thrombectomy at this time.  Recommend starting Eliquis with a loading dose of 5mg  transitioning 2.5mg  due to the patient's age and renal function.  We had a long discussion in regard to the venous lysis / thrombectomy procedure including the need to use contrast dye and its effects on her known contrast allergy and kidney function.  We discussed the two week window if her symptoms should worsen to reconsider  moving forward with a venous lysis/thrombectomy.  We will see the patient back in one week with a repeat duplex to reassess her DVT, symptoms and physical exam. Both the patient and her daughter are in agreement. 2. Hyperlipidemia: On statin for medical management. Encouraged good control as its slows the progression of atherosclerotic disease. 3. Hypertension: On appropriate medications. Encouraged good control as its slows the progression of atherosclerotic disease as well as renal disease.  Discussed with Dr. Mayme Genta, PA-C  01/19/2019 2:38 PM  This note was created with Dragon medical transcription system.  Any error is purely unintentional

## 2019-01-19 NOTE — ED Notes (Signed)
Pt transported to US

## 2019-01-19 NOTE — ED Notes (Signed)
Pt denies CP/SOB/HA.

## 2019-01-19 NOTE — Discharge Instructions (Addendum)
Vascular Surgery Discharge Instructions 1) Elevate your left lower extremity heart level or higher 2) No compression for the first 4-6 weeks

## 2019-01-22 DIAGNOSIS — Z853 Personal history of malignant neoplasm of breast: Secondary | ICD-10-CM | POA: Diagnosis not present

## 2019-01-22 DIAGNOSIS — I82492 Acute embolism and thrombosis of other specified deep vein of left lower extremity: Secondary | ICD-10-CM | POA: Diagnosis not present

## 2019-01-22 DIAGNOSIS — F411 Generalized anxiety disorder: Secondary | ICD-10-CM | POA: Diagnosis not present

## 2019-01-22 DIAGNOSIS — N184 Chronic kidney disease, stage 4 (severe): Secondary | ICD-10-CM | POA: Diagnosis not present

## 2019-01-22 DIAGNOSIS — I82401 Acute embolism and thrombosis of unspecified deep veins of right lower extremity: Secondary | ICD-10-CM | POA: Diagnosis not present

## 2019-01-22 DIAGNOSIS — R2681 Unsteadiness on feet: Secondary | ICD-10-CM | POA: Diagnosis not present

## 2019-01-22 DIAGNOSIS — M6281 Muscle weakness (generalized): Secondary | ICD-10-CM | POA: Diagnosis not present

## 2019-01-22 DIAGNOSIS — R2689 Other abnormalities of gait and mobility: Secondary | ICD-10-CM | POA: Diagnosis not present

## 2019-01-22 DIAGNOSIS — M48061 Spinal stenosis, lumbar region without neurogenic claudication: Secondary | ICD-10-CM | POA: Diagnosis not present

## 2019-01-22 DIAGNOSIS — N189 Chronic kidney disease, unspecified: Secondary | ICD-10-CM | POA: Diagnosis not present

## 2019-01-22 DIAGNOSIS — M545 Low back pain: Secondary | ICD-10-CM | POA: Diagnosis not present

## 2019-01-26 ENCOUNTER — Other Ambulatory Visit (INDEPENDENT_AMBULATORY_CARE_PROVIDER_SITE_OTHER): Payer: Self-pay | Admitting: Vascular Surgery

## 2019-01-26 DIAGNOSIS — I82412 Acute embolism and thrombosis of left femoral vein: Secondary | ICD-10-CM

## 2019-01-29 ENCOUNTER — Ambulatory Visit (INDEPENDENT_AMBULATORY_CARE_PROVIDER_SITE_OTHER): Payer: PPO

## 2019-01-29 ENCOUNTER — Other Ambulatory Visit: Payer: Self-pay

## 2019-01-29 ENCOUNTER — Encounter (INDEPENDENT_AMBULATORY_CARE_PROVIDER_SITE_OTHER): Payer: Self-pay | Admitting: Nurse Practitioner

## 2019-01-29 ENCOUNTER — Ambulatory Visit (INDEPENDENT_AMBULATORY_CARE_PROVIDER_SITE_OTHER): Payer: PPO | Admitting: Nurse Practitioner

## 2019-01-29 VITALS — BP 151/65 | HR 77 | Resp 17 | Ht 60.5 in | Wt 132.0 lb

## 2019-01-29 DIAGNOSIS — I10 Essential (primary) hypertension: Secondary | ICD-10-CM | POA: Diagnosis not present

## 2019-01-29 DIAGNOSIS — E785 Hyperlipidemia, unspecified: Secondary | ICD-10-CM | POA: Diagnosis not present

## 2019-01-29 DIAGNOSIS — I82412 Acute embolism and thrombosis of left femoral vein: Secondary | ICD-10-CM

## 2019-01-30 ENCOUNTER — Telehealth: Payer: Self-pay | Admitting: Internal Medicine

## 2019-01-30 NOTE — Telephone Encounter (Signed)
Received patients POA and  Living Will by fax on 01/30/2019. Sent for scan.

## 2019-02-05 ENCOUNTER — Encounter (INDEPENDENT_AMBULATORY_CARE_PROVIDER_SITE_OTHER): Payer: Self-pay | Admitting: Nurse Practitioner

## 2019-02-05 ENCOUNTER — Telehealth (INDEPENDENT_AMBULATORY_CARE_PROVIDER_SITE_OTHER): Payer: Self-pay | Admitting: Vascular Surgery

## 2019-02-05 ENCOUNTER — Telehealth (INDEPENDENT_AMBULATORY_CARE_PROVIDER_SITE_OTHER): Payer: Self-pay

## 2019-02-05 ENCOUNTER — Other Ambulatory Visit: Payer: Self-pay | Admitting: Internal Medicine

## 2019-02-05 NOTE — Telephone Encounter (Signed)
Error

## 2019-02-05 NOTE — Progress Notes (Signed)
SUBJECTIVE:  [name], female    DOB: 05-03-1930, 85 y.o.   MRN: [id number] Chief Complaint  Patient presents with  . Follow-up    HPI  [name] is a 85 y.o. female that presents today after presenting to the emergency room on 01/19/2019 and was subsequently diagnosed with a DVT in the left lower extremity.  The patient has dementia and is a poor historian.  Her son is present on the phone and a home health aide is present as well.  Initially the patient had at swelling and leg pain.  Today the patient continues to have leg swelling but the pain is diminished per the patient.  The patient is currently on Eliquis without any issue.  She denies any chest pain or shortness of breath.  Overall she feels well.  Today noninvasive studies show that she continues to have a DVT in the left lower extremity present from the left common femoral vein to the popliteal vein.  This is consistent with the previous exam done 01/19/2019.  The DVT is still showing in the acute phase.  Past Medical History:  Diagnosis Date  . Arthritis   . Breast cancer ([place]) 1995   LT LUMPECTOMY & s/p XRT  . Depression   . Hx of colonic polyp   . Hypercholesterolemia   . Hypertension   . Melanoma ([place])   . Personal history of radiation therapy   . Spinal stenosis    lumbar    Past Surgical History:  Procedure Laterality Date  . BREAST BIOPSY Left 2006   EXCISIONAL - NEG  . BREAST BIOPSY Left 1995   LT LUMPECTOMY FOR CA  . BREAST LUMPECTOMY Left 1995  . EYE SURGERY Bilateral    cataract excision  . LAPAROSCOPIC APPENDECTOMY N/A 01/11/2016   Procedure: APPENDECTOMY LAPAROSCOPIC;  Surgeon: Dia Crawford III, MD;  Location: [facility] ORS;  Service: General;  Laterality: N/A;    Social History   Socioeconomic History  . Marital status: Widowed    Spouse name: Not on file  . Number of children: 2  . Years of education: Not on file  . Highest education level: Not on file  Occupational  History  . Not on file  Social Needs  . Financial resource strain: Not hard at all  . Food insecurity    Worry: Never true    Inability: Never true  . Transportation needs    Medical: No    Non-medical: No  Tobacco Use  . Smoking status: Former Smoker    Packs/day: 0.50    Years: 4.00    Pack years: 2.00    Types: Cigarettes    Quit date: 12/09/1984    Years since quitting: 34.1  . Smokeless tobacco: Never Used  . Tobacco comment: quit 12/09/84  Substance and Sexual Activity  . Alcohol use: No    Alcohol/week: 0.0 standard drinks    Comment: sparingly  . Drug use: No  . Sexual activity: Never  Lifestyle  . Physical activity    Days per week: Not on file    Minutes per session: Not on file  . Stress: Not at all  Relationships  . Social Herbalist on phone: Patient refused    Gets together: Patient refused    Attends religious service: Patient refused    Active member of club or organization: Patient refused    Attends meetings of clubs or organizations: Patient refused    Relationship status:  Patient refused  . Intimate partner violence    Fear of current or ex partner: Patient refused    Emotionally abused: Patient refused    Physically abused: Patient refused    Forced sexual activity: Patient refused  Other Topics Concern  . Not on file  Social History Narrative  . Not on file    Family History  Problem Relation Age of Onset  . Arthritis Mother   . Hyperlipidemia Mother   . Hypertension Mother   . Hyperlipidemia Sister   . Kidney cancer Sister   . Breast cancer Neg Hx     Allergies  Allergen Reactions  . Contrast Media [Iodinated Diagnostic Agents] Hives     Review of Systems   Review of Systems: Negative Unless Checked Constitutional: [] Weight loss  [] Fever  [] Chills Cardiac: [] Chest pain   []  Atrial Fibrillation  [] Palpitations   [] Shortness of breath when laying flat   [] Shortness of breath with exertion. [] Shortness of breath at  rest Vascular:  [] Pain in legs with walking   [] Pain in legs with standing [] Pain in legs when laying flat   [] Claudication    [] Pain in feet when laying flat    [x] History of DVT   [] Phlebitis   [] Swelling in legs   [] Varicose veins   [] Non-healing ulcers Pulmonary:   [] Uses home oxygen   [] Productive cough   [] Hemoptysis   [] Wheeze  [] COPD   [] Asthma Neurologic:  [] Dizziness   [] Seizures  [] Blackouts [] History of stroke   [] History of TIA  [] Aphasia   [] Temporary Blindness   [] Weakness or numbness in arm   [] Weakness or numbness in leg Musculoskeletal:   [] Joint swelling   [] Joint pain   [] Low back pain  []  History of Knee Replacement [x] Arthritis [] back Surgeries  []  Spinal Stenosis    Hematologic:  [] Easy bruising  [] Easy bleeding   [] Hypercoagulable state   [] Anemic Gastrointestinal:  [] Diarrhea   [] Vomiting  [] Gastroesophageal reflux/heartburn   [] Difficulty swallowing. [] Abdominal pain Genitourinary:  [x] Chronic kidney disease   [] Difficult urination  [] Anuric   [] Blood in urine [] Frequent urination  [] Burning with urination   [] Hematuria Skin:  [] Rashes   [] Ulcers [] Wounds Psychological:  [x] History of anxiety   [x]  History of major depression  []  Memory Difficulties      OBJECTIVE:   Physical Exam  BP (!) 151/65 (BP Location: Right Arm)   Pulse 77   Resp 17   Ht 5' 0.5" (1.537 m)   Wt 132 lb (59.9 kg)   BMI 25.36 kg/m   Gen: WD/WN, NAD Head: [place]/AT, No temporalis wasting.  Ear/Nose/Throat: Hearing grossly intact, nares w/o erythema or drainage Eyes: PER, EOMI, sclera nonicteric.  Neck: Supple, no masses.  No JVD.  Pulmonary:  Good air movement, no use of accessory muscles.  Cardiac: RRR Vascular:  2+ edema left lower extremity, mild stasis dermatitis Vessel Right Left  Dorsalis Pedis Palpable Palpable  Posterior Tibial Palpable Palpable   Gastrointestinal: soft, non-distended. No guarding/no peritoneal signs.  Musculoskeletal: M/S 5/5 throughout.  No deformity or atrophy.   Neurologic: Pain and light touch intact in extremities.  Symmetrical.  Speech is fluent. Motor exam as listed above. Psychiatric: Judgment intact, Mood & affect appropriate for pt's clinical situation. Dermatologic: No Venous rashes. No Ulcers Noted.  No changes consistent with cellulitis. Lymph : No Cervical lymphadenopathy, no lichenification or skin changes of chronic lymphedema.       ASSESSMENT AND PLAN:  1. Acute deep vein thrombosis (DVT) of femoral vein of  left lower extremity ([place]) Currently at this time the patient is doing well with anticoagulation.  Typically there is a two week window for intervention with DVT, however she is nearly at that window and due to her advanced dementia it was agreed that continuing with anticoagulation only is in her best interest. We will return in 6 weeks to repeat DVT study.    2. Essential hypertension Continue antihypertensive medications as already ordered, these medications have been reviewed and there are no changes at this time.   3. Hyperlipidemia, unspecified hyperlipidemia type Continue statin as ordered and reviewed, no changes at this time    Current Outpatient Medications on File Prior to Visit  Medication Sig Dispense Refill  . apixaban (ELIQUIS) 2.5 MG TABS tablet Take 1 tablet (2.5 mg total) by mouth 2 (two) times daily. 60 tablet 0  . clonazePAM (KLONOPIN) 0.5 MG tablet TAKE 1 TABLET(0.5 MG) BY MOUTH DAILY AS NEEDED FOR ANXIETY 90 tablet 0  . lisinopril-hydrochlorothiazide (ZESTORETIC) 20-12.5 MG tablet Take 1 tablet by mouth daily. 90 tablet 1  . lovastatin (MEVACOR) 40 MG tablet TAKE 1 TO 2 TABLETS(40 TO 80 MG) BY MOUTH DAILY 180 tablet 0  . mirtazapine (REMERON) 30 MG tablet Take 1 tablet (30 mg total) by mouth at bedtime. 90 tablet 0  . Multiple Vitamin (MULTIVITAMIN) tablet Take 1 tablet by mouth daily.    . pregabalin (LYRICA) 50 MG capsule Take 50 mg by mouth 2 (two) times daily.     . traMADol (ULTRAM) 50 MG tablet  Take 50 mg by mouth 2 (two) times daily.     No current facility-administered medications on file prior to visit.     There are no Patient Instructions on file for this visit. No follow-ups on file.   Kris Hartmann, NP  This note was completed with Sales executive.  Any errors are purely unintentional.

## 2019-02-05 NOTE — Telephone Encounter (Signed)
-----   Message from Sela Hua, PA-C sent at 02/05/2019 11:01 AM EST ----- Regarding: DVT Results The patients DVT results from today are notable for a chronic DVT in the left leg. No evidence of any worsening / new DVT. She should continue to take her current dose of Eliquis 2.5mg  BID and keep her 03/2019 appointment. Please call the patient with the results. Thank you.

## 2019-02-05 NOTE — Telephone Encounter (Signed)
I have made the patient and her son aware with message

## 2019-02-07 DIAGNOSIS — R2681 Unsteadiness on feet: Secondary | ICD-10-CM | POA: Diagnosis not present

## 2019-02-07 DIAGNOSIS — I82492 Acute embolism and thrombosis of other specified deep vein of left lower extremity: Secondary | ICD-10-CM | POA: Diagnosis not present

## 2019-02-07 DIAGNOSIS — R2689 Other abnormalities of gait and mobility: Secondary | ICD-10-CM | POA: Diagnosis not present

## 2019-02-07 DIAGNOSIS — N189 Chronic kidney disease, unspecified: Secondary | ICD-10-CM | POA: Diagnosis not present

## 2019-02-07 DIAGNOSIS — M48061 Spinal stenosis, lumbar region without neurogenic claudication: Secondary | ICD-10-CM | POA: Diagnosis not present

## 2019-02-07 DIAGNOSIS — M545 Low back pain: Secondary | ICD-10-CM | POA: Diagnosis not present

## 2019-02-07 DIAGNOSIS — M6281 Muscle weakness (generalized): Secondary | ICD-10-CM | POA: Diagnosis not present

## 2019-02-13 DIAGNOSIS — Z20828 Contact with and (suspected) exposure to other viral communicable diseases: Secondary | ICD-10-CM | POA: Diagnosis not present

## 2019-02-28 DIAGNOSIS — Z20828 Contact with and (suspected) exposure to other viral communicable diseases: Secondary | ICD-10-CM | POA: Diagnosis not present

## 2019-03-03 DIAGNOSIS — L84 Corns and callosities: Secondary | ICD-10-CM | POA: Diagnosis not present

## 2019-03-03 DIAGNOSIS — M21612 Bunion of left foot: Secondary | ICD-10-CM | POA: Diagnosis not present

## 2019-03-03 DIAGNOSIS — L602 Onychogryphosis: Secondary | ICD-10-CM | POA: Diagnosis not present

## 2019-03-03 DIAGNOSIS — I739 Peripheral vascular disease, unspecified: Secondary | ICD-10-CM | POA: Diagnosis not present

## 2019-03-06 DIAGNOSIS — M48061 Spinal stenosis, lumbar region without neurogenic claudication: Secondary | ICD-10-CM | POA: Diagnosis not present

## 2019-03-06 DIAGNOSIS — Z79899 Other long term (current) drug therapy: Secondary | ICD-10-CM | POA: Diagnosis not present

## 2019-03-06 DIAGNOSIS — I10 Essential (primary) hypertension: Secondary | ICD-10-CM | POA: Diagnosis not present

## 2019-03-06 DIAGNOSIS — H6123 Impacted cerumen, bilateral: Secondary | ICD-10-CM | POA: Diagnosis not present

## 2019-03-06 DIAGNOSIS — R600 Localized edema: Secondary | ICD-10-CM | POA: Diagnosis not present

## 2019-03-12 DIAGNOSIS — R635 Abnormal weight gain: Secondary | ICD-10-CM | POA: Diagnosis not present

## 2019-03-12 DIAGNOSIS — H6123 Impacted cerumen, bilateral: Secondary | ICD-10-CM | POA: Diagnosis not present

## 2019-03-12 DIAGNOSIS — I10 Essential (primary) hypertension: Secondary | ICD-10-CM | POA: Diagnosis not present

## 2019-03-12 DIAGNOSIS — I824Z2 Acute embolism and thrombosis of unspecified deep veins of left distal lower extremity: Secondary | ICD-10-CM | POA: Diagnosis not present

## 2019-03-13 ENCOUNTER — Other Ambulatory Visit (INDEPENDENT_AMBULATORY_CARE_PROVIDER_SITE_OTHER): Payer: Self-pay | Admitting: Nurse Practitioner

## 2019-03-13 DIAGNOSIS — I82512 Chronic embolism and thrombosis of left femoral vein: Secondary | ICD-10-CM

## 2019-03-14 ENCOUNTER — Encounter (INDEPENDENT_AMBULATORY_CARE_PROVIDER_SITE_OTHER): Payer: Self-pay | Admitting: Nurse Practitioner

## 2019-03-14 ENCOUNTER — Ambulatory Visit (INDEPENDENT_AMBULATORY_CARE_PROVIDER_SITE_OTHER): Payer: PPO | Admitting: Nurse Practitioner

## 2019-03-14 ENCOUNTER — Ambulatory Visit (INDEPENDENT_AMBULATORY_CARE_PROVIDER_SITE_OTHER): Payer: PPO

## 2019-03-14 ENCOUNTER — Other Ambulatory Visit: Payer: Self-pay

## 2019-03-14 VITALS — BP 154/71 | HR 83 | Resp 16 | Wt 132.8 lb

## 2019-03-14 DIAGNOSIS — I82512 Chronic embolism and thrombosis of left femoral vein: Secondary | ICD-10-CM | POA: Diagnosis not present

## 2019-03-14 DIAGNOSIS — I82422 Acute embolism and thrombosis of left iliac vein: Secondary | ICD-10-CM | POA: Diagnosis not present

## 2019-03-14 DIAGNOSIS — I10 Essential (primary) hypertension: Secondary | ICD-10-CM | POA: Diagnosis not present

## 2019-03-15 DIAGNOSIS — I82492 Acute embolism and thrombosis of other specified deep vein of left lower extremity: Secondary | ICD-10-CM | POA: Diagnosis not present

## 2019-03-15 DIAGNOSIS — R2689 Other abnormalities of gait and mobility: Secondary | ICD-10-CM | POA: Diagnosis not present

## 2019-03-15 DIAGNOSIS — M545 Low back pain: Secondary | ICD-10-CM | POA: Diagnosis not present

## 2019-03-15 DIAGNOSIS — M48061 Spinal stenosis, lumbar region without neurogenic claudication: Secondary | ICD-10-CM | POA: Diagnosis not present

## 2019-03-15 DIAGNOSIS — M6281 Muscle weakness (generalized): Secondary | ICD-10-CM | POA: Diagnosis not present

## 2019-03-15 DIAGNOSIS — R2681 Unsteadiness on feet: Secondary | ICD-10-CM | POA: Diagnosis not present

## 2019-03-19 ENCOUNTER — Encounter (INDEPENDENT_AMBULATORY_CARE_PROVIDER_SITE_OTHER): Payer: Self-pay | Admitting: Nurse Practitioner

## 2019-03-19 NOTE — Progress Notes (Signed)
SUBJECTIVE:  [name], female    DOB: 07/24/1930, 86 y.o.   MRN: [id number] Chief Complaint  Patient presents with  . Follow-up    u/s follow up    HPI  Diana Lester is a 86 y.o. female that follows up today for noninvasive studies regarding her left lower extremity DVT.  This was initially diagnosed on 01/19/2019.  The patient was placed on Eliquis which she currently continues and has tolerated it well.  However the had decreased renal function as well as advanced age so it was felt that a 2.5 mg dosage would be in her best interest.  Since that time the patient has had some lower extremity edema however she denies any chest pain or shortness of breath.  The patient does have some issues with memory, there is an ABI that is also able to endorse what the patient is saying.  They deny any fever, chills, nausea vomiting or diarrhea.  Today the noninvasive study shows the patient still has evidence of a subacute thrombosis of her left common femoral vein, deep femoral vein, superficial femoral vein and popliteal vein.  This is consistent with the previous ultrasound.  Past Medical History:  Diagnosis Date  . Arthritis   . Breast cancer ([place]) 1995   LT LUMPECTOMY & s/p XRT  . Depression   . Hx of colonic polyp   . Hypercholesterolemia   . Hypertension   . Melanoma ([place])   . Personal history of radiation therapy   . Spinal stenosis    lumbar    Past Surgical History:  Procedure Laterality Date  . BREAST BIOPSY Left 2006   EXCISIONAL - NEG  . BREAST BIOPSY Left 1995   LT LUMPECTOMY FOR CA  . BREAST LUMPECTOMY Left 1995  . EYE SURGERY Bilateral    cataract excision  . LAPAROSCOPIC APPENDECTOMY N/A 01/11/2016   Procedure: APPENDECTOMY LAPAROSCOPIC;  Surgeon: Dia Crawford III, MD;  Location: [facility] ORS;  Service: General;  Laterality: N/A;    Social History   Socioeconomic History  . Marital status: Widowed    Spouse name: Not on file  . Number of children:  2  . Years of education: Not on file  . Highest education level: Not on file  Occupational History  . Not on file  Tobacco Use  . Smoking status: Former Smoker    Packs/day: 0.50    Years: 4.00    Pack years: 2.00    Types: Cigarettes    Quit date: 12/09/1984    Years since quitting: 34.2  . Smokeless tobacco: Never Used  . Tobacco comment: quit 12/09/84  Substance and Sexual Activity  . Alcohol use: No    Alcohol/week: 0.0 standard drinks    Comment: sparingly  . Drug use: No  . Sexual activity: Never  Other Topics Concern  . Not on file  Social History Narrative  . Not on file   Social Determinants of Health   Financial Resource Strain: Low Risk   . Difficulty of Paying Living Expenses: Not hard at all  Food Insecurity: No Food Insecurity  . Worried About Charity fundraiser in the Last Year: Never true  . Ran Out of Food in the Last Year: Never true  Transportation Needs: No Transportation Needs  . Lack of Transportation (Medical): No  . Lack of Transportation (Non-Medical): No  Physical Activity:   . Days of Exercise per Week: Not on file  . Minutes of Exercise per  Session: Not on file  Stress: No Stress Concern Present  . Feeling of Stress : Not at all  Social Connections: Unknown  . Frequency of Communication with Friends and Family: Patient refused  . Frequency of Social Gatherings with Friends and Family: Patient refused  . Attends Religious Services: Patient refused  . Active Member of Clubs or Organizations: Patient refused  . Attends Archivist Meetings: Patient refused  . Marital Status: Patient refused  Intimate Partner Violence: Unknown  . Fear of Current or Ex-Partner: Patient refused  . Emotionally Abused: Patient refused  . Physically Abused: Patient refused  . Sexually Abused: Patient refused    Family History  Problem Relation Age of Onset  . Arthritis Mother   . Hyperlipidemia Mother   . Hypertension Mother   . Hyperlipidemia  Sister   . Kidney cancer Sister   . Breast cancer Neg Hx     Allergies  Allergen Reactions  . Contrast Media [Iodinated Diagnostic Agents] Hives     Review of Systems   Review of Systems: Negative Unless Checked Constitutional: [] Weight loss  [] Fever  [] Chills Cardiac: [] Chest pain   []  Atrial Fibrillation  [] Palpitations   [] Shortness of breath when laying flat   [] Shortness of breath with exertion. [] Shortness of breath at rest Vascular:  [] Pain in legs with walking   [] Pain in legs with standing [] Pain in legs when laying flat   [] Claudication    [] Pain in feet when laying flat    [x] History of DVT   [] Phlebitis   [] Swelling in legs   [] Varicose veins   [] Non-healing ulcers Pulmonary:   [] Uses home oxygen   [] Productive cough   [] Hemoptysis   [] Wheeze  [] COPD   [] Asthma Neurologic:  [] Dizziness   [] Seizures  [] Blackouts [] History of stroke   [] History of TIA  [] Aphasia   [] Temporary Blindness   [] Weakness or numbness in arm   [] Weakness or numbness in leg Musculoskeletal:   [] Joint swelling   [] Joint pain   [] Low back pain  []  History of Knee Replacement [x] Arthritis [] back Surgeries  []  Spinal Stenosis    Hematologic:  [] Easy bruising  [] Easy bleeding   [] Hypercoagulable state   [] Anemic Gastrointestinal:  [] Diarrhea   [] Vomiting  [] Gastroesophageal reflux/heartburn   [] Difficulty swallowing. [] Abdominal pain Genitourinary:  [x] Chronic kidney disease   [] Difficult urination  [] Anuric   [] Blood in urine [] Frequent urination  [] Burning with urination   [] Hematuria Skin:  [] Rashes   [] Ulcers [] Wounds Psychological:  [x] History of anxiety   [x]  History of major depression  [x]  Memory Difficulties      OBJECTIVE:   Physical Exam  BP (!) 154/71 (BP Location: Right Arm)   Pulse 83   Resp 16   Wt 132 lb 12.8 oz (60.2 kg)   BMI 25.51 kg/m   Gen: WD/WN, NAD Head: [place]/AT, No temporalis wasting.  Ear/Nose/Throat: Hearing grossly intact, nares w/o erythema or drainage Eyes: PER, EOMI,  sclera nonicteric.  Neck: Supple, no masses.  No JVD.  Pulmonary:  Good air movement, no use of accessory muscles.  Cardiac: RRR Vascular:  2+ edema right lower extremity with mild stasis dermatitis Vessel Right Left  Dorsalis Pedis Palpable Palpable  Posterior Tibial Palpable Palpable   Gastrointestinal: soft, non-distended. No guarding/no peritoneal signs.  Musculoskeletal: M/S 5/5 throughout.  No deformity or atrophy.  Neurologic: Pain and light touch intact in extremities.  Symmetrical.  Speech is fluent. Motor exam as listed above. Psychiatric: Judgment intact, Mood & affect appropriate for pt's  clinical situation. Dermatologic:  No Ulcers Noted.  No changes consistent with cellulitis.      ASSESSMENT AND PLAN:  1. Deep vein thrombosis (DVT) of iliac vein of left lower extremity, unspecified chronicity (HCC) The patient continues to have a subacute thrombus in the left lower extremity.  I have suspicion that this is due to the lowered Eliquis dose versus the standard dose.  The patient continues to have very little symptoms that she is complaining of.  At this time the worst symptom is probably the lower extremity edema which does not seem to bother the patient greatly.  I have sent instructions to her nursing facility to help the patient in the TED hose in order to help control her lower extremity edema.  If the patient is able to tolerate walking this too would be helpful for her edema as well.  We will have the patient return in 8 weeks to repeat studies to evaluate progression of DVT at that time.  2. Essential hypertension, benign Blood pressure slightly elevated today.  Patient on appropriate medications.  No changes made today.   Current Outpatient Medications on File Prior to Visit  Medication Sig Dispense Refill  . acetaminophen (TYLENOL) 500 MG tablet Take 500 mg by mouth every 6 (six) hours as needed.    Marland Kitchen apixaban (ELIQUIS) 2.5 MG TABS tablet Take 1 tablet (2.5 mg total)  by mouth 2 (two) times daily. 60 tablet 0  . carbamide peroxide (DEBROX) 6.5 % OTIC solution 5 drops 2 (two) times daily.    . clonazePAM (KLONOPIN) 0.5 MG tablet TAKE 1 TABLET(0.5 MG) BY MOUTH DAILY AS NEEDED FOR ANXIETY 90 tablet 0  . lisinopril-hydrochlorothiazide (ZESTORETIC) 20-12.5 MG tablet Take 1 tablet by mouth daily. 90 tablet 1  . lovastatin (MEVACOR) 40 MG tablet TAKE 1 TO 2 TABLETS(40 TO 80 MG) BY MOUTH DAILY 180 tablet 0  . mirtazapine (REMERON) 30 MG tablet TAKE 1 TABLET BY MOUTH AT BEDTIME 30 tablet 1  . Multiple Vitamin (MULTIVITAMIN) TABS TAKE 1 TABLET BY MOUTH ONCE DAILY 30 tablet 1  . pregabalin (LYRICA) 50 MG capsule Take 50 mg by mouth 2 (two) times daily.     . traMADol (ULTRAM) 50 MG tablet Take 50 mg by mouth 2 (two) times daily.    . Zinc Oxide 40 % PSTE Apply topically.     No current facility-administered medications on file prior to visit.    There are no Patient Instructions on file for this visit. No follow-ups on file.   Kris Hartmann, NP  This note was completed with Sales executive.  Any errors are purely unintentional.

## 2019-03-22 DIAGNOSIS — E782 Mixed hyperlipidemia: Secondary | ICD-10-CM | POA: Diagnosis not present

## 2019-03-22 DIAGNOSIS — N189 Chronic kidney disease, unspecified: Secondary | ICD-10-CM | POA: Diagnosis not present

## 2019-03-22 DIAGNOSIS — I10 Essential (primary) hypertension: Secondary | ICD-10-CM | POA: Diagnosis not present

## 2019-03-29 DIAGNOSIS — Z20828 Contact with and (suspected) exposure to other viral communicable diseases: Secondary | ICD-10-CM | POA: Diagnosis not present

## 2019-04-05 DIAGNOSIS — Z20828 Contact with and (suspected) exposure to other viral communicable diseases: Secondary | ICD-10-CM | POA: Diagnosis not present

## 2019-04-12 DIAGNOSIS — Z20828 Contact with and (suspected) exposure to other viral communicable diseases: Secondary | ICD-10-CM | POA: Diagnosis not present

## 2019-04-19 DIAGNOSIS — Z20828 Contact with and (suspected) exposure to other viral communicable diseases: Secondary | ICD-10-CM | POA: Diagnosis not present

## 2019-04-27 DIAGNOSIS — Z20828 Contact with and (suspected) exposure to other viral communicable diseases: Secondary | ICD-10-CM | POA: Diagnosis not present

## 2019-04-28 DIAGNOSIS — Z20828 Contact with and (suspected) exposure to other viral communicable diseases: Secondary | ICD-10-CM | POA: Diagnosis not present

## 2019-05-03 DIAGNOSIS — Z20828 Contact with and (suspected) exposure to other viral communicable diseases: Secondary | ICD-10-CM | POA: Diagnosis not present

## 2019-05-07 DIAGNOSIS — G8929 Other chronic pain: Secondary | ICD-10-CM | POA: Diagnosis not present

## 2019-05-07 DIAGNOSIS — E782 Mixed hyperlipidemia: Secondary | ICD-10-CM | POA: Diagnosis not present

## 2019-05-07 DIAGNOSIS — M48061 Spinal stenosis, lumbar region without neurogenic claudication: Secondary | ICD-10-CM | POA: Diagnosis not present

## 2019-05-07 DIAGNOSIS — N1832 Chronic kidney disease, stage 3b: Secondary | ICD-10-CM | POA: Diagnosis not present

## 2019-05-07 DIAGNOSIS — L89891 Pressure ulcer of other site, stage 1: Secondary | ICD-10-CM | POA: Diagnosis not present

## 2019-05-08 ENCOUNTER — Other Ambulatory Visit (INDEPENDENT_AMBULATORY_CARE_PROVIDER_SITE_OTHER): Payer: Self-pay | Admitting: Vascular Surgery

## 2019-05-08 DIAGNOSIS — Z86718 Personal history of other venous thrombosis and embolism: Secondary | ICD-10-CM

## 2019-05-08 DIAGNOSIS — M21612 Bunion of left foot: Secondary | ICD-10-CM | POA: Diagnosis not present

## 2019-05-08 DIAGNOSIS — M2042 Other hammer toe(s) (acquired), left foot: Secondary | ICD-10-CM | POA: Diagnosis not present

## 2019-05-08 DIAGNOSIS — L84 Corns and callosities: Secondary | ICD-10-CM | POA: Diagnosis not present

## 2019-05-08 DIAGNOSIS — L602 Onychogryphosis: Secondary | ICD-10-CM | POA: Diagnosis not present

## 2019-05-08 DIAGNOSIS — M79672 Pain in left foot: Secondary | ICD-10-CM | POA: Diagnosis not present

## 2019-05-08 DIAGNOSIS — I739 Peripheral vascular disease, unspecified: Secondary | ICD-10-CM | POA: Diagnosis not present

## 2019-05-09 ENCOUNTER — Encounter (INDEPENDENT_AMBULATORY_CARE_PROVIDER_SITE_OTHER): Payer: Self-pay | Admitting: Nurse Practitioner

## 2019-05-09 ENCOUNTER — Other Ambulatory Visit: Payer: Self-pay

## 2019-05-09 ENCOUNTER — Ambulatory Visit (INDEPENDENT_AMBULATORY_CARE_PROVIDER_SITE_OTHER): Payer: PPO

## 2019-05-09 ENCOUNTER — Ambulatory Visit (INDEPENDENT_AMBULATORY_CARE_PROVIDER_SITE_OTHER): Payer: PPO | Admitting: Nurse Practitioner

## 2019-05-09 VITALS — BP 167/83 | HR 71 | Resp 13 | Ht 63.0 in | Wt 138.0 lb

## 2019-05-09 DIAGNOSIS — I82512 Chronic embolism and thrombosis of left femoral vein: Secondary | ICD-10-CM | POA: Diagnosis not present

## 2019-05-09 DIAGNOSIS — I10 Essential (primary) hypertension: Secondary | ICD-10-CM

## 2019-05-09 DIAGNOSIS — Z86718 Personal history of other venous thrombosis and embolism: Secondary | ICD-10-CM | POA: Diagnosis not present

## 2019-05-11 ENCOUNTER — Encounter (INDEPENDENT_AMBULATORY_CARE_PROVIDER_SITE_OTHER): Payer: Self-pay | Admitting: Nurse Practitioner

## 2019-05-11 NOTE — Progress Notes (Signed)
SUBJECTIVE:  [name], female    DOB: Jun 16, 1930, 86 y.o.   MRN: [id number] Chief Complaint  Patient presents with  . Follow-up    U/S Follow up    HPI  Diana Lester is a 86 y.o. female the presents today for follow-up studies related to a left lower extremity DVT.  The patient was originally diagnosed with a DVT on 01/19/2019.  However, due to the patient's advanced age in addition to her kidney function she was started on a 2.5 mg twice daily dosage versus the standard 5 mg twice daily dosage.  The patient has tolerated this well.  However, likely due to this decreased dosage it is taken a little bit longer for the DVT to show signs of improvement in addition to moving to a chronic state.  The patient has also had some left lower extremity edema associated with her DVT.  Currently she denies any fever, chills, nausea, vomiting or shortness of breath.  The patient does have some memory issues however an aide from her nursing facility is present to assist with some history.  Since the patient's last visit the swelling in her left lower extremity has greatly decreased.  The patient also endorses wearing medical grade 1 compression stockings daily in addition to exercising and elevating her lower extremity.  The patient states that her left lower extremity feels much better.  Today noninvasive studies show that the left lower extremity DVT has progressed to a chronic deep venous imposes.  The DVT has also greatly improved from the previous study and decreased in size.  Past Medical History:  Diagnosis Date  . Arthritis   . Breast cancer ([place]) 1995   LT LUMPECTOMY & s/p XRT  . Depression   . Hx of colonic polyp   . Hypercholesterolemia   . Hypertension   . Melanoma ([place])   . Personal history of radiation therapy   . Spinal stenosis    lumbar    Past Surgical History:  Procedure Laterality Date  . BREAST BIOPSY Left 2006   EXCISIONAL - NEG  . BREAST BIOPSY Left  1995   LT LUMPECTOMY FOR CA  . BREAST LUMPECTOMY Left 1995  . EYE SURGERY Bilateral    cataract excision  . LAPAROSCOPIC APPENDECTOMY N/A 01/11/2016   Procedure: APPENDECTOMY LAPAROSCOPIC;  Surgeon: Dia Crawford III, MD;  Location: [facility] ORS;  Service: General;  Laterality: N/A;    Social History   Socioeconomic History  . Marital status: Widowed    Spouse name: Not on file  . Number of children: 2  . Years of education: Not on file  . Highest education level: Not on file  Occupational History  . Not on file  Tobacco Use  . Smoking status: Former Smoker    Packs/day: 0.50    Years: 4.00    Pack years: 2.00    Types: Cigarettes    Quit date: 12/09/1984    Years since quitting: 34.4  . Smokeless tobacco: Never Used  . Tobacco comment: quit 12/09/84  Substance and Sexual Activity  . Alcohol use: No    Alcohol/week: 0.0 standard drinks    Comment: sparingly  . Drug use: No  . Sexual activity: Never  Other Topics Concern  . Not on file  Social History Narrative  . Not on file   Social Determinants of Health   Financial Resource Strain: Low Risk   . Difficulty of Paying Living Expenses: Not hard at all  Food  Insecurity: No Food Insecurity  . Worried About Charity fundraiser in the Last Year: Never true  . Ran Out of Food in the Last Year: Never true  Transportation Needs: No Transportation Needs  . Lack of Transportation (Medical): No  . Lack of Transportation (Non-Medical): No  Physical Activity:   . Days of Exercise per Week: Not on file  . Minutes of Exercise per Session: Not on file  Stress: No Stress Concern Present  . Feeling of Stress : Not at all  Social Connections: Unknown  . Frequency of Communication with Friends and Family: Patient refused  . Frequency of Social Gatherings with Friends and Family: Patient refused  . Attends Religious Services: Patient refused  . Active Member of Clubs or Organizations: Patient refused  . Attends Archivist  Meetings: Patient refused  . Marital Status: Patient refused  Intimate Partner Violence: Unknown  . Fear of Current or Ex-Partner: Patient refused  . Emotionally Abused: Patient refused  . Physically Abused: Patient refused  . Sexually Abused: Patient refused    Family History  Problem Relation Age of Onset  . Arthritis Mother   . Hyperlipidemia Mother   . Hypertension Mother   . Hyperlipidemia Sister   . Kidney cancer Sister   . Breast cancer Neg Hx     Allergies  Allergen Reactions  . Contrast Media [Iodinated Diagnostic Agents] Hives     Review of Systems   Review of Systems: Negative Unless Checked Constitutional: [] Weight loss  [] Fever  [] Chills Cardiac: [] Chest pain   []  Atrial Fibrillation  [] Palpitations   [] Shortness of breath when laying flat   [] Shortness of breath with exertion. [] Shortness of breath at rest Vascular:  [] Pain in legs with walking   [] Pain in legs with standing [] Pain in legs when laying flat   [] Claudication    [] Pain in feet when laying flat    [x] History of DVT   [] Phlebitis   [x] Swelling in legs   [] Varicose veins   [] Non-healing ulcers Pulmonary:   [] Uses home oxygen   [] Productive cough   [] Hemoptysis   [] Wheeze  [] COPD   [] Asthma Neurologic:  [] Dizziness   [] Seizures  [] Blackouts [] History of stroke   [] History of TIA  [] Aphasia   [] Temporary Blindness   [] Weakness or numbness in arm   [] Weakness or numbness in leg Musculoskeletal:   [] Joint swelling   [] Joint pain   [] Low back pain  []  History of Knee Replacement [x] Arthritis [] back Surgeries  []  Spinal Stenosis    Hematologic:  [] Easy bruising  [] Easy bleeding   [] Hypercoagulable state   [] Anemic Gastrointestinal:  [] Diarrhea   [] Vomiting  [] Gastroesophageal reflux/heartburn   [] Difficulty swallowing. [] Abdominal pain Genitourinary:  [] Chronic kidney disease   [] Difficult urination  [] Anuric   [] Blood in urine [] Frequent urination  [] Burning with urination   [] Hematuria Skin:  [] Rashes    [] Ulcers [] Wounds Psychological:  [x] History of anxiety   [x]  History of major depression  [x]  Memory Difficulties      OBJECTIVE:   Physical Exam  BP (!) 167/83   Pulse 71   Resp 13   Ht 5\' 3"  (1.6 m)   Wt 138 lb (62.6 kg)   BMI 24.45 kg/m   Gen: WD/WN, NAD Head: [place]/AT, No temporalis wasting.  Ear/Nose/Throat: Hearing grossly intact, nares w/o erythema or drainage Eyes: PER, EOMI, sclera nonicteric.  Neck: Supple, no masses.  No JVD.  Pulmonary:  Good air movement, no use of accessory muscles.  Cardiac: RRR Vascular:  1+ edema left lower extremity Vessel Right Left  Radial Palpable Palpable  Dorsalis Pedis Palpable Palpable  Posterior Tibial Palpable Palpable   Gastrointestinal: soft, non-distended. No guarding/no peritoneal signs.  Musculoskeletal: M/S 5/5 throughout.  No deformity or atrophy.  Neurologic: Pain and light touch intact in extremities.  Symmetrical.  Speech is fluent. Motor exam as listed above. Psychiatric: Judgment intact, Mood & affect appropriate for pt's clinical situation. Dermatologic: No Venous rashes. No Ulcers Noted.  No changes consistent with cellulitis. Lymph : No Cervical lymphadenopathy, no lichenification or skin changes of chronic lymphedema.       ASSESSMENT AND PLAN:  1. Chronic deep vein thrombosis (DVT) of femoral vein of left lower extremity (HCC) The patient's left lower extremity has shown great improvement today.  The patient is to continue with conservative therapy that she has been doing including wearing her medical grade 1 compression stockings, elevating her lower extremity and exercising daily.  We will have the patient return in 6 months to discuss stopping Eliquis.  Otherwise since the patient has been performing well we will continue with Eliquis as prescribed unless there are issues with bleeding.  2. Essential hypertension Blood pressure slightly elevated today however the patient is somewhat anxious.  Patient is on  appropriate medications.  No changes made today.   Current Outpatient Medications on File Prior to Visit  Medication Sig Dispense Refill  . acetaminophen (TYLENOL) 500 MG tablet Take 500 mg by mouth every 6 (six) hours as needed.    Marland Kitchen apixaban (ELIQUIS) 2.5 MG TABS tablet Take 1 tablet (2.5 mg total) by mouth 2 (two) times daily. 60 tablet 0  . clonazePAM (KLONOPIN) 0.5 MG tablet TAKE 1 TABLET(0.5 MG) BY MOUTH DAILY AS NEEDED FOR ANXIETY 90 tablet 0  . lisinopril-hydrochlorothiazide (ZESTORETIC) 20-12.5 MG tablet Take 1 tablet by mouth daily. 90 tablet 1  . lovastatin (MEVACOR) 40 MG tablet TAKE 1 TO 2 TABLETS(40 TO 80 MG) BY MOUTH DAILY 180 tablet 0  . mirtazapine (REMERON) 30 MG tablet TAKE 1 TABLET BY MOUTH AT BEDTIME 30 tablet 1  . Multiple Vitamin (MULTIVITAMIN) TABS TAKE 1 TABLET BY MOUTH ONCE DAILY 30 tablet 1  . pregabalin (LYRICA) 50 MG capsule Take 50 mg by mouth 2 (two) times daily.     . traMADol (ULTRAM) 50 MG tablet Take 50 mg by mouth 2 (two) times daily.    . Zinc Oxide 40 % PSTE Apply topically.    . carbamide peroxide (DEBROX) 6.5 % OTIC solution 5 drops 2 (two) times daily.     No current facility-administered medications on file prior to visit.    There are no Patient Instructions on file for this visit. No follow-ups on file.   Kris Hartmann, NP  This note was completed with Sales executive.  Any errors are purely unintentional.

## 2019-06-04 DIAGNOSIS — H6123 Impacted cerumen, bilateral: Secondary | ICD-10-CM | POA: Diagnosis not present

## 2019-06-04 DIAGNOSIS — I824Z2 Acute embolism and thrombosis of unspecified deep veins of left distal lower extremity: Secondary | ICD-10-CM | POA: Diagnosis not present

## 2019-06-04 DIAGNOSIS — F411 Generalized anxiety disorder: Secondary | ICD-10-CM | POA: Diagnosis not present

## 2019-06-21 DIAGNOSIS — Z20828 Contact with and (suspected) exposure to other viral communicable diseases: Secondary | ICD-10-CM | POA: Diagnosis not present

## 2019-06-28 DIAGNOSIS — Z20828 Contact with and (suspected) exposure to other viral communicable diseases: Secondary | ICD-10-CM | POA: Diagnosis not present

## 2019-07-05 DIAGNOSIS — Z20828 Contact with and (suspected) exposure to other viral communicable diseases: Secondary | ICD-10-CM | POA: Diagnosis not present

## 2019-07-11 DIAGNOSIS — M79672 Pain in left foot: Secondary | ICD-10-CM | POA: Diagnosis not present

## 2019-07-11 DIAGNOSIS — M2042 Other hammer toe(s) (acquired), left foot: Secondary | ICD-10-CM | POA: Diagnosis not present

## 2019-07-11 DIAGNOSIS — I739 Peripheral vascular disease, unspecified: Secondary | ICD-10-CM | POA: Diagnosis not present

## 2019-07-11 DIAGNOSIS — M21612 Bunion of left foot: Secondary | ICD-10-CM | POA: Diagnosis not present

## 2019-07-11 DIAGNOSIS — L602 Onychogryphosis: Secondary | ICD-10-CM | POA: Diagnosis not present

## 2019-07-11 DIAGNOSIS — L84 Corns and callosities: Secondary | ICD-10-CM | POA: Diagnosis not present

## 2019-07-12 DIAGNOSIS — Z20828 Contact with and (suspected) exposure to other viral communicable diseases: Secondary | ICD-10-CM | POA: Diagnosis not present

## 2019-07-12 DIAGNOSIS — I10 Essential (primary) hypertension: Secondary | ICD-10-CM | POA: Diagnosis not present

## 2019-07-23 DIAGNOSIS — I129 Hypertensive chronic kidney disease with stage 1 through stage 4 chronic kidney disease, or unspecified chronic kidney disease: Secondary | ICD-10-CM | POA: Diagnosis not present

## 2019-07-23 DIAGNOSIS — F411 Generalized anxiety disorder: Secondary | ICD-10-CM | POA: Diagnosis not present

## 2019-07-23 DIAGNOSIS — Z7901 Long term (current) use of anticoagulants: Secondary | ICD-10-CM | POA: Diagnosis not present

## 2019-07-23 DIAGNOSIS — I10 Essential (primary) hypertension: Secondary | ICD-10-CM | POA: Diagnosis not present

## 2019-07-23 DIAGNOSIS — N1832 Chronic kidney disease, stage 3b: Secondary | ICD-10-CM | POA: Diagnosis not present

## 2019-07-23 DIAGNOSIS — F3340 Major depressive disorder, recurrent, in remission, unspecified: Secondary | ICD-10-CM | POA: Diagnosis not present

## 2019-07-23 DIAGNOSIS — Z87898 Personal history of other specified conditions: Secondary | ICD-10-CM | POA: Diagnosis not present

## 2019-07-30 ENCOUNTER — Other Ambulatory Visit: Payer: Self-pay | Admitting: Internal Medicine

## 2019-08-13 DIAGNOSIS — R197 Diarrhea, unspecified: Secondary | ICD-10-CM | POA: Diagnosis not present

## 2019-08-13 DIAGNOSIS — M545 Low back pain: Secondary | ICD-10-CM | POA: Diagnosis not present

## 2019-08-13 DIAGNOSIS — F3340 Major depressive disorder, recurrent, in remission, unspecified: Secondary | ICD-10-CM | POA: Diagnosis not present

## 2019-08-13 DIAGNOSIS — G8929 Other chronic pain: Secondary | ICD-10-CM | POA: Diagnosis not present

## 2019-08-13 DIAGNOSIS — F411 Generalized anxiety disorder: Secondary | ICD-10-CM | POA: Diagnosis not present

## 2019-09-12 DIAGNOSIS — F3340 Major depressive disorder, recurrent, in remission, unspecified: Secondary | ICD-10-CM | POA: Diagnosis not present

## 2019-09-12 DIAGNOSIS — G8929 Other chronic pain: Secondary | ICD-10-CM | POA: Diagnosis not present

## 2019-09-12 DIAGNOSIS — M545 Low back pain: Secondary | ICD-10-CM | POA: Diagnosis not present

## 2019-09-12 DIAGNOSIS — L603 Nail dystrophy: Secondary | ICD-10-CM | POA: Diagnosis not present

## 2019-09-12 DIAGNOSIS — I82402 Acute embolism and thrombosis of unspecified deep veins of left lower extremity: Secondary | ICD-10-CM | POA: Diagnosis not present

## 2019-09-12 DIAGNOSIS — F411 Generalized anxiety disorder: Secondary | ICD-10-CM | POA: Diagnosis not present

## 2019-10-08 DIAGNOSIS — L84 Corns and callosities: Secondary | ICD-10-CM | POA: Diagnosis not present

## 2019-10-08 DIAGNOSIS — L603 Nail dystrophy: Secondary | ICD-10-CM | POA: Diagnosis not present

## 2019-10-08 DIAGNOSIS — I739 Peripheral vascular disease, unspecified: Secondary | ICD-10-CM | POA: Diagnosis not present

## 2019-10-08 DIAGNOSIS — F411 Generalized anxiety disorder: Secondary | ICD-10-CM | POA: Diagnosis not present

## 2019-10-08 DIAGNOSIS — F3340 Major depressive disorder, recurrent, in remission, unspecified: Secondary | ICD-10-CM | POA: Diagnosis not present

## 2019-10-15 DIAGNOSIS — R269 Unspecified abnormalities of gait and mobility: Secondary | ICD-10-CM | POA: Diagnosis not present

## 2019-10-15 DIAGNOSIS — S20212A Contusion of left front wall of thorax, initial encounter: Secondary | ICD-10-CM | POA: Diagnosis not present

## 2019-10-15 DIAGNOSIS — R0781 Pleurodynia: Secondary | ICD-10-CM | POA: Diagnosis not present

## 2019-11-01 ENCOUNTER — Ambulatory Visit: Payer: PPO

## 2019-11-08 DIAGNOSIS — Z20828 Contact with and (suspected) exposure to other viral communicable diseases: Secondary | ICD-10-CM | POA: Diagnosis not present

## 2019-11-09 ENCOUNTER — Ambulatory Visit (INDEPENDENT_AMBULATORY_CARE_PROVIDER_SITE_OTHER): Payer: PPO | Admitting: Vascular Surgery

## 2019-11-15 DIAGNOSIS — Z20828 Contact with and (suspected) exposure to other viral communicable diseases: Secondary | ICD-10-CM | POA: Diagnosis not present

## 2019-11-19 DIAGNOSIS — G8929 Other chronic pain: Secondary | ICD-10-CM | POA: Diagnosis not present

## 2019-11-19 DIAGNOSIS — F411 Generalized anxiety disorder: Secondary | ICD-10-CM | POA: Diagnosis not present

## 2019-11-19 DIAGNOSIS — I10 Essential (primary) hypertension: Secondary | ICD-10-CM | POA: Diagnosis not present

## 2019-11-19 DIAGNOSIS — R54 Age-related physical debility: Secondary | ICD-10-CM | POA: Diagnosis not present

## 2019-11-20 DIAGNOSIS — Z20828 Contact with and (suspected) exposure to other viral communicable diseases: Secondary | ICD-10-CM | POA: Diagnosis not present

## 2019-11-29 ENCOUNTER — Other Ambulatory Visit: Payer: Self-pay | Admitting: Internal Medicine

## 2019-11-29 ENCOUNTER — Other Ambulatory Visit: Payer: Self-pay | Admitting: General Practice

## 2019-11-29 ENCOUNTER — Other Ambulatory Visit: Payer: Self-pay

## 2019-11-29 DIAGNOSIS — Z1231 Encounter for screening mammogram for malignant neoplasm of breast: Secondary | ICD-10-CM

## 2019-12-06 ENCOUNTER — Telehealth: Payer: Self-pay

## 2019-12-06 NOTE — Telephone Encounter (Signed)
Called the patient to schedule an appointment as they have not been seen in a year. Aidaly took our office number and stated that she would "look into it".

## 2019-12-13 ENCOUNTER — Other Ambulatory Visit: Payer: Self-pay

## 2019-12-20 ENCOUNTER — Other Ambulatory Visit: Payer: Self-pay

## 2019-12-20 DIAGNOSIS — Z1231 Encounter for screening mammogram for malignant neoplasm of breast: Secondary | ICD-10-CM

## 2019-12-24 DIAGNOSIS — H6123 Impacted cerumen, bilateral: Secondary | ICD-10-CM | POA: Diagnosis not present

## 2019-12-24 DIAGNOSIS — F411 Generalized anxiety disorder: Secondary | ICD-10-CM | POA: Diagnosis not present

## 2019-12-24 DIAGNOSIS — R54 Age-related physical debility: Secondary | ICD-10-CM | POA: Diagnosis not present

## 2020-01-21 ENCOUNTER — Telehealth (INDEPENDENT_AMBULATORY_CARE_PROVIDER_SITE_OTHER): Payer: Self-pay

## 2020-01-21 DIAGNOSIS — E559 Vitamin D deficiency, unspecified: Secondary | ICD-10-CM | POA: Diagnosis not present

## 2020-01-21 DIAGNOSIS — I824Z2 Acute embolism and thrombosis of unspecified deep veins of left distal lower extremity: Secondary | ICD-10-CM | POA: Diagnosis not present

## 2020-01-21 DIAGNOSIS — N1832 Chronic kidney disease, stage 3b: Secondary | ICD-10-CM | POA: Diagnosis not present

## 2020-01-21 DIAGNOSIS — Z7901 Long term (current) use of anticoagulants: Secondary | ICD-10-CM | POA: Diagnosis not present

## 2020-01-21 DIAGNOSIS — I10 Essential (primary) hypertension: Secondary | ICD-10-CM | POA: Diagnosis not present

## 2020-01-21 DIAGNOSIS — E782 Mixed hyperlipidemia: Secondary | ICD-10-CM | POA: Diagnosis not present

## 2020-01-21 NOTE — Telephone Encounter (Signed)
Diana Lester  From [facility]  and left a VM on the nurses line wanting to know if the pt should continue her eliquis after being treated for a DVT. Please advise.

## 2020-01-21 NOTE — Telephone Encounter (Signed)
I left a VM for Bailey Callas with the NP instructions.

## 2020-01-21 NOTE — Telephone Encounter (Signed)
The patient had an appointment on 11/09/2019 to discuss stopping eliquis as that would've been six months of treatment.  The patient can reasonably stop at this time and follow up if there are issues

## 2020-01-25 ENCOUNTER — Telehealth: Payer: Self-pay | Admitting: Internal Medicine

## 2020-01-25 NOTE — Telephone Encounter (Signed)
Left message for patient to call back and schedule Medicare Annual Wellness Visit (AWV)   This should be virtual or a telephone visit only=30 minutes.  Last AWV 10/31/18; please schedule at anytime with Denisa O'Brien-Blaney at [facility].

## 2020-02-14 DIAGNOSIS — E559 Vitamin D deficiency, unspecified: Secondary | ICD-10-CM | POA: Diagnosis not present

## 2020-02-14 DIAGNOSIS — E782 Mixed hyperlipidemia: Secondary | ICD-10-CM | POA: Diagnosis not present

## 2020-02-14 DIAGNOSIS — N1832 Chronic kidney disease, stage 3b: Secondary | ICD-10-CM | POA: Diagnosis not present

## 2020-02-15 ENCOUNTER — Telehealth (INDEPENDENT_AMBULATORY_CARE_PROVIDER_SITE_OTHER): Payer: Self-pay

## 2020-02-16 NOTE — Telephone Encounter (Signed)
We received a call, and addressed this same issue on 01/21/2020.  The patient was supposed to have an appointment on 11/09/2019 to discuss stopping eliquis. No ultrasound was necessary as previous doppler had showed resolution essentially.  She is safe to stop her eliquis at this time if there are no further issues and follow up as needed

## 2020-02-18 DIAGNOSIS — F411 Generalized anxiety disorder: Secondary | ICD-10-CM | POA: Diagnosis not present

## 2020-02-18 DIAGNOSIS — I824Z2 Acute embolism and thrombosis of unspecified deep veins of left distal lower extremity: Secondary | ICD-10-CM | POA: Diagnosis not present

## 2020-02-18 DIAGNOSIS — I10 Essential (primary) hypertension: Secondary | ICD-10-CM | POA: Diagnosis not present

## 2020-02-18 DIAGNOSIS — E559 Vitamin D deficiency, unspecified: Secondary | ICD-10-CM | POA: Diagnosis not present

## 2020-02-18 DIAGNOSIS — E782 Mixed hyperlipidemia: Secondary | ICD-10-CM | POA: Diagnosis not present

## 2020-02-18 NOTE — Telephone Encounter (Signed)
Attempted to call and left a voicemail to return a call back to the office

## 2020-03-12 DIAGNOSIS — U071 COVID-19: Secondary | ICD-10-CM | POA: Diagnosis not present

## 2020-03-12 DIAGNOSIS — Z1152 Encounter for screening for COVID-19: Secondary | ICD-10-CM | POA: Diagnosis not present

## 2020-04-05 ENCOUNTER — Encounter: Payer: Self-pay | Admitting: *Deleted

## 2020-04-07 ENCOUNTER — Ambulatory Visit
Admission: RE | Admit: 2020-04-07 | Discharge: 2020-04-07 | Disposition: A | Discharge status: Home / Self Care | Payer: PPO | Source: Ambulatory Visit | Attending: Physician Assistant | Admitting: Physician Assistant

## 2020-04-07 ENCOUNTER — Other Ambulatory Visit: Payer: Self-pay

## 2020-04-07 DIAGNOSIS — Z1231 Encounter for screening mammogram for malignant neoplasm of breast: Secondary | ICD-10-CM | POA: Insufficient documentation

## 2020-05-12 DIAGNOSIS — F411 Generalized anxiety disorder: Secondary | ICD-10-CM | POA: Diagnosis not present

## 2020-05-12 DIAGNOSIS — M48061 Spinal stenosis, lumbar region without neurogenic claudication: Secondary | ICD-10-CM | POA: Diagnosis not present

## 2020-05-12 DIAGNOSIS — G8929 Other chronic pain: Secondary | ICD-10-CM | POA: Diagnosis not present

## 2020-05-12 DIAGNOSIS — M79601 Pain in right arm: Secondary | ICD-10-CM | POA: Diagnosis not present

## 2020-05-12 DIAGNOSIS — I10 Essential (primary) hypertension: Secondary | ICD-10-CM | POA: Diagnosis not present

## 2020-05-14 ENCOUNTER — Telehealth (INDEPENDENT_AMBULATORY_CARE_PROVIDER_SITE_OTHER): Payer: Self-pay | Admitting: Vascular Surgery

## 2020-05-14 NOTE — Telephone Encounter (Signed)
Sindhu Nguyen (daughter) called inquiring if mother needs to continue taking Eliquis.  She states she's had issues in the past with blood clots.  She would like a call back.

## 2020-05-14 NOTE — Telephone Encounter (Signed)
Patient daughter was made aware that her mother is safe to stop her Eliquis at this time if there are no further issues and follow up as needed per last note in patient chart.

## 2020-05-26 DIAGNOSIS — M79601 Pain in right arm: Secondary | ICD-10-CM | POA: Diagnosis not present

## 2020-05-26 DIAGNOSIS — D225 Melanocytic nevi of trunk: Secondary | ICD-10-CM | POA: Diagnosis not present

## 2020-05-29 DIAGNOSIS — Z7901 Long term (current) use of anticoagulants: Secondary | ICD-10-CM | POA: Diagnosis not present

## 2020-05-29 DIAGNOSIS — E782 Mixed hyperlipidemia: Secondary | ICD-10-CM | POA: Diagnosis not present

## 2020-05-29 DIAGNOSIS — I10 Essential (primary) hypertension: Secondary | ICD-10-CM | POA: Diagnosis not present

## 2020-06-12 DIAGNOSIS — N183 Chronic kidney disease, stage 3 unspecified: Secondary | ICD-10-CM | POA: Diagnosis not present

## 2020-06-12 DIAGNOSIS — I129 Hypertensive chronic kidney disease with stage 1 through stage 4 chronic kidney disease, or unspecified chronic kidney disease: Secondary | ICD-10-CM | POA: Diagnosis not present

## 2020-06-12 DIAGNOSIS — D649 Anemia, unspecified: Secondary | ICD-10-CM | POA: Diagnosis not present

## 2020-06-12 DIAGNOSIS — M48061 Spinal stenosis, lumbar region without neurogenic claudication: Secondary | ICD-10-CM | POA: Diagnosis not present

## 2020-06-12 DIAGNOSIS — G8929 Other chronic pain: Secondary | ICD-10-CM | POA: Diagnosis not present

## 2020-06-12 DIAGNOSIS — M79601 Pain in right arm: Secondary | ICD-10-CM | POA: Diagnosis not present

## 2020-06-12 DIAGNOSIS — D6489 Other specified anemias: Secondary | ICD-10-CM | POA: Diagnosis not present

## 2020-06-12 DIAGNOSIS — E538 Deficiency of other specified B group vitamins: Secondary | ICD-10-CM | POA: Diagnosis not present

## 2020-07-17 DIAGNOSIS — I129 Hypertensive chronic kidney disease with stage 1 through stage 4 chronic kidney disease, or unspecified chronic kidney disease: Secondary | ICD-10-CM | POA: Diagnosis not present

## 2020-07-17 DIAGNOSIS — E538 Deficiency of other specified B group vitamins: Secondary | ICD-10-CM | POA: Diagnosis not present

## 2020-07-17 DIAGNOSIS — D6489 Other specified anemias: Secondary | ICD-10-CM | POA: Diagnosis not present

## 2020-07-17 DIAGNOSIS — N183 Chronic kidney disease, stage 3 unspecified: Secondary | ICD-10-CM | POA: Diagnosis not present

## 2020-07-17 DIAGNOSIS — M48061 Spinal stenosis, lumbar region without neurogenic claudication: Secondary | ICD-10-CM | POA: Diagnosis not present

## 2020-07-17 DIAGNOSIS — M79601 Pain in right arm: Secondary | ICD-10-CM | POA: Diagnosis not present

## 2020-07-17 DIAGNOSIS — G894 Chronic pain syndrome: Secondary | ICD-10-CM | POA: Diagnosis not present

## 2020-07-31 DIAGNOSIS — M79601 Pain in right arm: Secondary | ICD-10-CM | POA: Diagnosis not present

## 2020-07-31 DIAGNOSIS — M545 Low back pain, unspecified: Secondary | ICD-10-CM | POA: Diagnosis not present

## 2020-07-31 DIAGNOSIS — G894 Chronic pain syndrome: Secondary | ICD-10-CM | POA: Diagnosis not present

## 2020-07-31 DIAGNOSIS — M48061 Spinal stenosis, lumbar region without neurogenic claudication: Secondary | ICD-10-CM | POA: Diagnosis not present

## 2020-09-11 DIAGNOSIS — N183 Chronic kidney disease, stage 3 unspecified: Secondary | ICD-10-CM | POA: Diagnosis not present

## 2020-09-11 DIAGNOSIS — F339 Major depressive disorder, recurrent, unspecified: Secondary | ICD-10-CM | POA: Diagnosis not present

## 2020-09-11 DIAGNOSIS — I129 Hypertensive chronic kidney disease with stage 1 through stage 4 chronic kidney disease, or unspecified chronic kidney disease: Secondary | ICD-10-CM | POA: Diagnosis not present

## 2020-09-11 DIAGNOSIS — D509 Iron deficiency anemia, unspecified: Secondary | ICD-10-CM | POA: Diagnosis not present

## 2020-09-25 DIAGNOSIS — G894 Chronic pain syndrome: Secondary | ICD-10-CM | POA: Diagnosis not present

## 2020-09-25 DIAGNOSIS — R2681 Unsteadiness on feet: Secondary | ICD-10-CM | POA: Diagnosis not present

## 2020-09-25 DIAGNOSIS — M545 Low back pain, unspecified: Secondary | ICD-10-CM | POA: Diagnosis not present

## 2020-09-25 DIAGNOSIS — M79601 Pain in right arm: Secondary | ICD-10-CM | POA: Diagnosis not present

## 2020-09-25 DIAGNOSIS — M48061 Spinal stenosis, lumbar region without neurogenic claudication: Secondary | ICD-10-CM | POA: Diagnosis not present

## 2020-10-09 DIAGNOSIS — F331 Major depressive disorder, recurrent, moderate: Secondary | ICD-10-CM | POA: Diagnosis not present

## 2020-10-09 DIAGNOSIS — F419 Anxiety disorder, unspecified: Secondary | ICD-10-CM | POA: Diagnosis not present

## 2020-10-16 DIAGNOSIS — N184 Chronic kidney disease, stage 4 (severe): Secondary | ICD-10-CM | POA: Diagnosis not present

## 2020-10-16 DIAGNOSIS — D509 Iron deficiency anemia, unspecified: Secondary | ICD-10-CM | POA: Diagnosis not present

## 2020-10-16 DIAGNOSIS — I10 Essential (primary) hypertension: Secondary | ICD-10-CM | POA: Diagnosis not present

## 2020-10-16 DIAGNOSIS — E538 Deficiency of other specified B group vitamins: Secondary | ICD-10-CM | POA: Diagnosis not present

## 2020-10-16 DIAGNOSIS — R195 Other fecal abnormalities: Secondary | ICD-10-CM | POA: Diagnosis not present

## 2020-10-16 DIAGNOSIS — D6489 Other specified anemias: Secondary | ICD-10-CM | POA: Diagnosis not present

## 2020-10-30 DIAGNOSIS — M545 Low back pain, unspecified: Secondary | ICD-10-CM | POA: Diagnosis not present

## 2020-10-30 DIAGNOSIS — M79601 Pain in right arm: Secondary | ICD-10-CM | POA: Diagnosis not present

## 2020-10-30 DIAGNOSIS — N1832 Chronic kidney disease, stage 3b: Secondary | ICD-10-CM | POA: Diagnosis not present

## 2020-10-30 DIAGNOSIS — G894 Chronic pain syndrome: Secondary | ICD-10-CM | POA: Diagnosis not present

## 2020-10-30 DIAGNOSIS — M48061 Spinal stenosis, lumbar region without neurogenic claudication: Secondary | ICD-10-CM | POA: Diagnosis not present

## 2020-11-13 DIAGNOSIS — F331 Major depressive disorder, recurrent, moderate: Secondary | ICD-10-CM | POA: Diagnosis not present

## 2020-11-13 DIAGNOSIS — F419 Anxiety disorder, unspecified: Secondary | ICD-10-CM | POA: Diagnosis not present

## 2020-11-17 DIAGNOSIS — N390 Urinary tract infection, site not specified: Secondary | ICD-10-CM | POA: Diagnosis not present

## 2020-11-17 DIAGNOSIS — Z79899 Other long term (current) drug therapy: Secondary | ICD-10-CM | POA: Diagnosis not present

## 2020-11-20 DIAGNOSIS — N898 Other specified noninflammatory disorders of vagina: Secondary | ICD-10-CM | POA: Diagnosis not present

## 2020-11-27 DIAGNOSIS — D539 Nutritional anemia, unspecified: Secondary | ICD-10-CM | POA: Diagnosis not present

## 2020-11-27 DIAGNOSIS — N898 Other specified noninflammatory disorders of vagina: Secondary | ICD-10-CM | POA: Diagnosis not present

## 2020-11-27 DIAGNOSIS — N1832 Chronic kidney disease, stage 3b: Secondary | ICD-10-CM | POA: Diagnosis not present

## 2020-11-27 DIAGNOSIS — I739 Peripheral vascular disease, unspecified: Secondary | ICD-10-CM | POA: Diagnosis not present

## 2020-11-27 DIAGNOSIS — F339 Major depressive disorder, recurrent, unspecified: Secondary | ICD-10-CM | POA: Diagnosis not present

## 2020-11-27 DIAGNOSIS — F419 Anxiety disorder, unspecified: Secondary | ICD-10-CM | POA: Diagnosis not present

## 2020-12-01 DIAGNOSIS — M545 Low back pain, unspecified: Secondary | ICD-10-CM | POA: Diagnosis not present

## 2020-12-01 DIAGNOSIS — N949 Unspecified condition associated with female genital organs and menstrual cycle: Secondary | ICD-10-CM | POA: Diagnosis not present

## 2020-12-01 DIAGNOSIS — G894 Chronic pain syndrome: Secondary | ICD-10-CM | POA: Diagnosis not present

## 2020-12-08 ENCOUNTER — Telehealth: Payer: Self-pay | Admitting: Internal Medicine

## 2020-12-08 NOTE — Telephone Encounter (Signed)
Patient's caregiver calling in. Patient used to see dr Nicki Reaper. Daughter is wanting to re-establish her here. Informed that Dr Nicki Reaper is not taking new patient's but would still like a message sent back as this is a previous Patient.   Please advise

## 2020-12-09 DIAGNOSIS — F3340 Major depressive disorder, recurrent, in remission, unspecified: Secondary | ICD-10-CM | POA: Diagnosis not present

## 2020-12-09 DIAGNOSIS — N189 Chronic kidney disease, unspecified: Secondary | ICD-10-CM | POA: Diagnosis not present

## 2020-12-09 DIAGNOSIS — F411 Generalized anxiety disorder: Secondary | ICD-10-CM | POA: Diagnosis not present

## 2020-12-09 DIAGNOSIS — R54 Age-related physical debility: Secondary | ICD-10-CM | POA: Diagnosis not present

## 2020-12-09 DIAGNOSIS — Z853 Personal history of malignant neoplasm of breast: Secondary | ICD-10-CM | POA: Diagnosis not present

## 2020-12-09 DIAGNOSIS — E782 Mixed hyperlipidemia: Secondary | ICD-10-CM | POA: Diagnosis not present

## 2020-12-09 DIAGNOSIS — N184 Chronic kidney disease, stage 4 (severe): Secondary | ICD-10-CM | POA: Diagnosis not present

## 2020-12-09 DIAGNOSIS — I739 Peripheral vascular disease, unspecified: Secondary | ICD-10-CM | POA: Diagnosis not present

## 2020-12-09 DIAGNOSIS — G8929 Other chronic pain: Secondary | ICD-10-CM | POA: Diagnosis not present

## 2020-12-09 NOTE — Progress Notes (Signed)
System, Provider Not In   Chief Complaint  Patient presents with   Vaginal Irritation    No discharge, odor     HPI:      [name] is a 87 y.o. No obstetric history on file. whose LMP was No LMP recorded. Patient is postmenopausal., presents today for NP eval of vaginal burning, referred by PCP.  Sx started with vaginal burning a few wks ago. Treated with abx twice for UTI with neg C&S (per pt's sister). Sx didn't improve. Then did monistat supp for 3 nights and external monistat yeast tx, still with sx. No vaginal d/c, odor. No vag bleeding. No pelvic pain/fevers. Not sex active. Uses soap to wash, pt unsure if scented or not. Denies urine incont/doesn't wear pads. Gets up daily, not lying on chucks pad.  Hx of breaset cancer with lumpectomy in 1995   Past Medical History:  Diagnosis Date   Arthritis    Breast cancer ([place]) 1995   LT LUMPECTOMY & s/p XRT   Depression    Hx of colonic polyp    Hypercholesterolemia    Hypertension    Melanoma ([place])    Personal history of radiation therapy    Spinal stenosis    lumbar    Past Surgical History:  Procedure Laterality Date   BREAST BIOPSY Left 2006   EXCISIONAL - NEG   BREAST BIOPSY Left 1995   LT LUMPECTOMY FOR CA   BREAST LUMPECTOMY Left 1995   EYE SURGERY Bilateral    cataract excision   LAPAROSCOPIC APPENDECTOMY N/A 01/11/2016   Procedure: APPENDECTOMY LAPAROSCOPIC;  Surgeon: Dia Crawford III, MD;  Location: [facility] ORS;  Service: General;  Laterality: N/A;    Family History  Problem Relation Age of Onset   Arthritis Mother    Hyperlipidemia Mother    Hypertension Mother    Hyperlipidemia Sister    Kidney cancer Sister    Breast cancer Neg Hx     Social History   Socioeconomic History   Marital status: Widowed    Spouse name: Not on file   Number of children: 2   Years of education: Not on file   Highest education level: Not on file  Occupational History   Not on file  Tobacco Use   Smoking  status: Former    Packs/day: 0.50    Years: 4.00    Pack years: 2.00    Types: Cigarettes    Quit date: 12/09/1984    Years since quitting: 36.0   Smokeless tobacco: Never   Tobacco comments:    quit 12/09/84  Substance and Sexual Activity   Alcohol use: No    Alcohol/week: 0.0 standard drinks    Comment: sparingly   Drug use: No   Sexual activity: Never  Other Topics Concern   Not on file  Social History Narrative   Not on file   Social Determinants of Health   Financial Resource Strain: Not on file  Food Insecurity: Not on file  Transportation Needs: Not on file  Physical Activity: Not on file  Stress: Not on file  Social Connections: Not on file  Intimate Partner Violence: Not on file    Outpatient Medications Prior to Visit  Medication Sig Dispense Refill   acetaminophen (TYLENOL) 500 MG tablet Take 500 mg by mouth every 6 (six) hours as needed.     lisinopril-hydrochlorothiazide (ZESTORETIC) 20-12.5 MG tablet Take 1 tablet by mouth daily. 90 tablet 1   lovastatin (MEVACOR)  40 MG tablet TAKE 1 TO 2 TABLETS(40 TO 80 MG) BY MOUTH DAILY 180 tablet 0   mirtazapine (REMERON) 15 MG tablet Take 15 mg by mouth at bedtime.     Multiple Vitamin (MULTIVITAMIN) TABS TAKE 1 TABLET BY MOUTH ONCE DAILY 30 tablet 1   traMADol (ULTRAM) 50 MG tablet Take 50 mg by mouth 2 (two) times daily.     apixaban (ELIQUIS) 2.5 MG TABS tablet Take 1 tablet (2.5 mg total) by mouth 2 (two) times daily. 60 tablet 0   carbamide peroxide (DEBROX) 6.5 % OTIC solution 5 drops 2 (two) times daily.     clonazePAM (KLONOPIN) 0.5 MG tablet TAKE 1 TABLET(0.5 MG) BY MOUTH DAILY AS NEEDED FOR ANXIETY 90 tablet 0   mirtazapine (REMERON) 30 MG tablet TAKE 1 TABLET BY MOUTH AT BEDTIME FOR ANXIETY 30 tablet 1   pregabalin (LYRICA) 50 MG capsule Take 50 mg by mouth 2 (two) times daily.      Zinc Oxide 40 % PSTE Apply topically.     No facility-administered medications prior to visit.      ROS:  Review of  Systems  Constitutional:  Negative for fever.  Gastrointestinal:  Negative for blood in stool, constipation, diarrhea, nausea and vomiting.  Genitourinary:  Positive for vaginal pain. Negative for dyspareunia, dysuria, flank pain, frequency, hematuria, urgency, vaginal bleeding and vaginal discharge.  Musculoskeletal:  Negative for back pain.  Skin:  Negative for rash.  BREAST: No symptoms   OBJECTIVE:   Vitals:  BP 120/70   Ht 5\' 4"  (1.626 m)   Wt 144 lb (65.3 kg)   BMI 24.72 kg/m   Physical Exam Vitals reviewed.  Constitutional:      Appearance: She is well-developed.  Pulmonary:     Effort: Pulmonary effort is normal.  Genitourinary:    General: Normal vulva.     Pubic Area: No rash.      Labia:        Right: Tenderness present. No rash or lesion.        Left: Tenderness present. No rash or lesion.      Comments: BILAT LABIA MINORA WITH BURNING SENSATION, MILD ERYTHEMA; NO D/C, NO LESIONS QTIP USED TO GET VAG D/C FOR WET PREP Musculoskeletal:        General: Normal range of motion.     Cervical back: Normal range of motion.  Skin:    General: Skin is warm and dry.  Neurological:     General: No focal deficit present.     Mental Status: She is alert and oriented to person, place, and time.  Psychiatric:        Mood and Affect: Mood normal.        Behavior: Behavior normal.        Thought Content: Thought content normal.        Judgment: Judgment normal.    Results: Results for orders placed or performed in visit on 12/10/20 (from the past 24 hour(s))  POCT Wet Prep with KOH     Status: Normal   Collection Time: 12/10/20 10:06 AM  Result Value Ref Range   Trichomonas, UA Negative    Clue Cells Wet Prep HPF POC neg    Epithelial Wet Prep HPF POC     Yeast Wet Prep HPF POC neg    Bacteria Wet Prep HPF POC     RBC Wet Prep HPF POC     WBC Wet Prep HPF POC     KOH  Prep POC Negative Negative     Assessment/Plan: Vaginal burning - Plan:  clotrimazole-betamethasone (LOTRISONE) cream, POCT Wet Prep with KOH; neg wet prep, tender/erythema on exam. Question fungal vs chem derm vs vag atrophy. Rx lotrisone crm BID for 2 wks, use unscented soap. F/u prn. Given hx of breast cancer, vag ERT not recommended but could prob be tried for a few days prn if all else fails.    Meds ordered this encounter  Medications   clotrimazole-betamethasone (LOTRISONE) cream    Sig: Apply externally BID for 2 wks    Dispense:  15 g    Refill:  0    Order Specific Question:   Supervising Provider    Answer:   Gae Dry [[id number]]     Return if symptoms worsen or fail to improve.  Arend Bahl B. Shavonna Corella, PA-C 12/10/2020 10:40 AM

## 2020-12-10 ENCOUNTER — Encounter: Payer: Self-pay | Admitting: Obstetrics and Gynecology

## 2020-12-10 ENCOUNTER — Other Ambulatory Visit: Payer: Self-pay

## 2020-12-10 ENCOUNTER — Ambulatory Visit: Payer: PPO | Admitting: Obstetrics and Gynecology

## 2020-12-10 VITALS — BP 120/70 | Ht 64.0 in | Wt 144.0 lb

## 2020-12-10 DIAGNOSIS — N949 Unspecified condition associated with female genital organs and menstrual cycle: Secondary | ICD-10-CM | POA: Diagnosis not present

## 2020-12-10 LAB — POCT WET PREP WITH KOH
Clue Cells Wet Prep HPF POC: NEGATIVE
KOH Prep POC: NEGATIVE
Trichomonas, UA: NEGATIVE
Yeast Wet Prep HPF POC: NEGATIVE

## 2020-12-10 MED ORDER — CLOTRIMAZOLE-BETAMETHASONE 1-0.05 % EX CREA: TOPICAL_CREAM | CUTANEOUS | 0 refills | Status: DC

## 2020-12-10 NOTE — Telephone Encounter (Signed)
See message.

## 2020-12-10 NOTE — Telephone Encounter (Signed)
Called the number listed below and spoke with aide at [facility]. Patient saw Elmo Putt Copland today for vaginal burning but was unsure about message below. She is going to have the med tech return my call to discuss

## 2020-12-10 NOTE — Telephone Encounter (Signed)
[facility] calling back in and state the Patient will be seeing another provider.   Nothing further needed

## 2020-12-25 DIAGNOSIS — Z79899 Other long term (current) drug therapy: Secondary | ICD-10-CM | POA: Diagnosis not present

## 2020-12-25 DIAGNOSIS — F419 Anxiety disorder, unspecified: Secondary | ICD-10-CM | POA: Diagnosis not present

## 2020-12-25 DIAGNOSIS — I10 Essential (primary) hypertension: Secondary | ICD-10-CM | POA: Diagnosis not present

## 2020-12-25 DIAGNOSIS — N760 Acute vaginitis: Secondary | ICD-10-CM | POA: Diagnosis not present

## 2020-12-25 DIAGNOSIS — I739 Peripheral vascular disease, unspecified: Secondary | ICD-10-CM | POA: Diagnosis not present

## 2020-12-29 DIAGNOSIS — N760 Acute vaginitis: Secondary | ICD-10-CM | POA: Diagnosis not present

## 2020-12-29 DIAGNOSIS — I10 Essential (primary) hypertension: Secondary | ICD-10-CM | POA: Diagnosis not present

## 2020-12-29 DIAGNOSIS — Z853 Personal history of malignant neoplasm of breast: Secondary | ICD-10-CM | POA: Diagnosis not present

## 2020-12-29 DIAGNOSIS — G301 Alzheimer's disease with late onset: Secondary | ICD-10-CM | POA: Diagnosis not present

## 2020-12-30 DIAGNOSIS — F331 Major depressive disorder, recurrent, moderate: Secondary | ICD-10-CM | POA: Diagnosis not present

## 2020-12-30 DIAGNOSIS — F411 Generalized anxiety disorder: Secondary | ICD-10-CM | POA: Diagnosis not present

## 2021-01-09 DIAGNOSIS — E785 Hyperlipidemia, unspecified: Secondary | ICD-10-CM | POA: Diagnosis not present

## 2021-01-09 DIAGNOSIS — D509 Iron deficiency anemia, unspecified: Secondary | ICD-10-CM | POA: Diagnosis not present

## 2021-01-09 DIAGNOSIS — E039 Hypothyroidism, unspecified: Secondary | ICD-10-CM | POA: Diagnosis not present

## 2021-01-09 DIAGNOSIS — D519 Vitamin B12 deficiency anemia, unspecified: Secondary | ICD-10-CM | POA: Diagnosis not present

## 2021-01-09 DIAGNOSIS — Z79899 Other long term (current) drug therapy: Secondary | ICD-10-CM | POA: Diagnosis not present

## 2021-01-09 DIAGNOSIS — E119 Type 2 diabetes mellitus without complications: Secondary | ICD-10-CM | POA: Diagnosis not present

## 2021-01-15 ENCOUNTER — Ambulatory Visit: Payer: PPO | Admitting: Obstetrics and Gynecology

## 2021-01-15 ENCOUNTER — Encounter: Payer: Self-pay | Admitting: Obstetrics and Gynecology

## 2021-01-15 ENCOUNTER — Other Ambulatory Visit: Payer: Self-pay

## 2021-01-15 VITALS — BP 120/80 | Ht 64.0 in | Wt 139.0 lb

## 2021-01-15 DIAGNOSIS — N949 Unspecified condition associated with female genital organs and menstrual cycle: Secondary | ICD-10-CM | POA: Diagnosis not present

## 2021-01-15 NOTE — Progress Notes (Signed)
System, Provider Not In   Chief Complaint  Patient presents with   Vaginal Burning    No discharge, odor or itchiness    HPI:      [name] is a 87 y.o. No obstetric history on file. whose LMP was No LMP recorded. Patient is postmenopausal., presents today for vaginal burning f/u. Seen 12/10/20 for sx and given lotrisone crm. Had essentially neg exam. States cream did not help at all. Sometimes vagina feels like it's on fire vs warm. Sx relief with cool compresses temporarily. No vag d/c, odor. Uses unscented soap in shower, but also washes underwear in sink with detergent. Unsure if unscented or getting all detergent out. Pt lives in assisted living and likes to wash her own underwear. Per pt's daughter, pt tends to get anxious and "fixates" on sx. They restarted her anxiety Rx but this hasn't helped.   12/10/20 NOTE: NP eval of vaginal burning, referred by PCP.  Sx started with vaginal burning a few wks ago. Treated with abx twice for UTI with neg C&S (per pt's sister). Sx didn't improve. Then did monistat supp for 3 nights and external monistat yeast tx, still with sx. No vaginal d/c, odor. No vag bleeding. No pelvic pain/fevers. Not sex active. Uses soap to wash, pt unsure if scented or not. Denies urine incont/doesn't wear pads. Gets up daily, not lying on chucks pad.  Hx of breaset cancer with lumpectomy in 1995  Past Medical History:  Diagnosis Date   Arthritis    Breast cancer ([place]) 1995   LT LUMPECTOMY & s/p XRT   Depression    Hx of colonic polyp    Hypercholesterolemia    Hypertension    Melanoma ([place])    Personal history of radiation therapy    Spinal stenosis    lumbar    Past Surgical History:  Procedure Laterality Date   BREAST BIOPSY Left 2006   EXCISIONAL - NEG   BREAST BIOPSY Left 1995   LT LUMPECTOMY FOR CA   BREAST LUMPECTOMY Left 1995   EYE SURGERY Bilateral    cataract excision   LAPAROSCOPIC APPENDECTOMY N/A 01/11/2016   Procedure:  APPENDECTOMY LAPAROSCOPIC;  Surgeon: Dia Crawford III, MD;  Location: [facility] ORS;  Service: General;  Laterality: N/A;    Family History  Problem Relation Age of Onset   Arthritis Mother    Hyperlipidemia Mother    Hypertension Mother    Hyperlipidemia Sister    Kidney cancer Sister    Breast cancer Neg Hx     Social History   Socioeconomic History   Marital status: Widowed    Spouse name: Not on file   Number of children: 2   Years of education: Not on file   Highest education level: Not on file  Occupational History   Not on file  Tobacco Use   Smoking status: Former    Packs/day: 0.50    Years: 4.00    Pack years: 2.00    Types: Cigarettes    Quit date: 12/09/1984    Years since quitting: 36.1   Smokeless tobacco: Never   Tobacco comments:    quit 12/09/84  Substance and Sexual Activity   Alcohol use: No    Alcohol/week: 0.0 standard drinks    Comment: sparingly   Drug use: No   Sexual activity: Never  Other Topics Concern   Not on file  Social History Narrative   Not on file   Social Determinants of  Health   Financial Resource Strain: Not on file  Food Insecurity: Not on file  Transportation Needs: Not on file  Physical Activity: Not on file  Stress: Not on file  Social Connections: Not on file  Intimate Partner Violence: Not on file    Outpatient Medications Prior to Visit  Medication Sig Dispense Refill   acetaminophen (TYLENOL) 500 MG tablet Take 500 mg by mouth every 6 (six) hours as needed.     clotrimazole-betamethasone (LOTRISONE) cream Apply externally BID for 2 wks 15 g 0   lisinopril-hydrochlorothiazide (ZESTORETIC) 20-12.5 MG tablet Take 1 tablet by mouth daily. 90 tablet 1   lovastatin (MEVACOR) 40 MG tablet TAKE 1 TO 2 TABLETS(40 TO 80 MG) BY MOUTH DAILY 180 tablet 0   mirtazapine (REMERON) 15 MG tablet Take 15 mg by mouth at bedtime.     Multiple Vitamin (MULTIVITAMIN) TABS TAKE 1 TABLET BY MOUTH ONCE DAILY 30 tablet 1   traMADol (ULTRAM) 50  MG tablet Take 50 mg by mouth 2 (two) times daily.     No facility-administered medications prior to visit.      ROS:  Review of Systems  Constitutional:  Negative for fever.  Gastrointestinal:  Negative for blood in stool, constipation, diarrhea, nausea and vomiting.  Genitourinary:  Positive for vaginal pain. Negative for dyspareunia, dysuria, flank pain, frequency, hematuria, urgency, vaginal bleeding and vaginal discharge.  Musculoskeletal:  Negative for back pain.  Skin:  Negative for rash.  BREAST: No symptoms   OBJECTIVE:   Vitals:  BP 120/80   Ht 5\' 4"  (1.626 m)   Wt 139 lb (63 kg)   BMI 23.86 kg/m   Physical Exam Constitutional:      Appearance: Normal appearance.  Pulmonary:     Effort: Pulmonary effort is normal.  Genitourinary:    Labia:        Right: No rash or lesion.        Left: No rash, tenderness or lesion.     Musculoskeletal:        General: Normal range of motion.  Neurological:     Mental Status: She is alert and oriented to person, place, and time.  Psychiatric:        Judgment: Judgment normal.    Assessment/Plan: Vaginal burning--neg exam. Rule out yeast/AV with MDL culture. Will f/u with daughter with results, her HCPOA. Will try vag ERT ext only pea size amt QD for 7 days then stop, while waiting for culture results to see if helps sx. 1 sample prem vag crm given. Pt with hx of breast cancer years ago, but per her daughter, Kennon Rounds ERT ok to use to treat sx (aware of indication against ERT and breast cancer hx, although will have minimal to nil systemic exposure given current tx regimen). Pt to make sure all soap is sensitive skin and detergent rinsed out well in underwear. Cool compresses prn.   Diana Lester: [phone]    Return if symptoms worsen or fail to improve.  Dujuan Stankowski B. Deriona Altemose, PA-C 01/15/2021 12:08 PM

## 2021-01-19 ENCOUNTER — Telehealth: Payer: Self-pay | Admitting: Obstetrics and Gynecology

## 2021-01-19 NOTE — Telephone Encounter (Signed)
Pt's daughter, Hinton Dyer, notified of neg AV and yeast on One Swab culture. Pt trying vag ERT daily for 7 days, too early to know if working yet. If doesn't work, pt may need neurontin for nerve pain since neg external vaginal exam and cultures/testing neg. [name] to f/u prn.

## 2021-01-22 DIAGNOSIS — F411 Generalized anxiety disorder: Secondary | ICD-10-CM | POA: Diagnosis not present

## 2021-01-22 DIAGNOSIS — N760 Acute vaginitis: Secondary | ICD-10-CM | POA: Diagnosis not present

## 2021-01-27 DIAGNOSIS — F411 Generalized anxiety disorder: Secondary | ICD-10-CM | POA: Diagnosis not present

## 2021-01-27 DIAGNOSIS — F331 Major depressive disorder, recurrent, moderate: Secondary | ICD-10-CM | POA: Diagnosis not present

## 2021-01-30 DIAGNOSIS — B351 Tinea unguium: Secondary | ICD-10-CM | POA: Diagnosis not present

## 2021-01-30 DIAGNOSIS — N184 Chronic kidney disease, stage 4 (severe): Secondary | ICD-10-CM | POA: Diagnosis not present

## 2021-02-02 NOTE — Telephone Encounter (Addendum)
Spoke with pt's daughter. Vag ERT didn't really help. Still has burning. Pt with neg exam, no response to topical steroid, topical antifungal, vag ERT. Question neuro since burning pain. Sent msg to PCP re: trying gabapentin? Pt's daughter will f/u with PCP, too.

## 2021-02-02 NOTE — Telephone Encounter (Deleted)
Msg sent to PCP.

## 2021-02-02 NOTE — Telephone Encounter (Signed)
Hinton Dyer (dgtr) reports Diana Lester is still complaining after 7 days of estrogen. She believes it has helped a little. Requesting return call from [facility], [place]. [phone]

## 2021-02-02 NOTE — Telephone Encounter (Deleted)
Spoke with pt's daughter. Topical estrogen has helped slightly but still having burning. Given sx and neg exam., question more neurological. Will f/u with PCP re: tx with gabapentin possibly. Already treated with steroid, antifungal med topically.

## 2021-02-05 DIAGNOSIS — F411 Generalized anxiety disorder: Secondary | ICD-10-CM | POA: Diagnosis not present

## 2021-02-05 DIAGNOSIS — G301 Alzheimer's disease with late onset: Secondary | ICD-10-CM | POA: Diagnosis not present

## 2021-02-05 DIAGNOSIS — N760 Acute vaginitis: Secondary | ICD-10-CM | POA: Diagnosis not present

## 2021-02-05 DIAGNOSIS — F02A4 Dementia in other diseases classified elsewhere, mild, with anxiety: Secondary | ICD-10-CM | POA: Diagnosis not present

## 2021-02-09 ENCOUNTER — Telehealth: Payer: Self-pay | Admitting: Internal Medicine

## 2021-02-09 NOTE — Telephone Encounter (Signed)
Daughter requesting physical , lab and PAP done for her mother. Dr. Nicki Reaper was taken off as PCP , due the patient being at [facility].

## 2021-02-09 NOTE — Telephone Encounter (Signed)
Okay to add you back as PCP and schedule?

## 2021-02-10 NOTE — Telephone Encounter (Signed)
Diana Lester, see me about this.  It appears she is seeing gyn for pelvic exams.  Note states - schedule for pap smear.  Also, see previous message about establishing care.

## 2021-02-11 ENCOUNTER — Other Ambulatory Visit: Payer: Self-pay | Admitting: Obstetrics and Gynecology

## 2021-02-11 ENCOUNTER — Telehealth: Payer: Self-pay | Admitting: Obstetrics and Gynecology

## 2021-02-11 DIAGNOSIS — N949 Unspecified condition associated with female genital organs and menstrual cycle: Secondary | ICD-10-CM

## 2021-02-11 MED ORDER — LIDOCAINE 5 % EX OINT: 1.0000 "application " | TOPICAL_OINTMENT | CUTANEOUS | 0 refills | Status: DC | PRN

## 2021-02-11 NOTE — Telephone Encounter (Signed)
This patient's daughter called in and would like for you to call her.  Her mother is continuing to have problems.

## 2021-02-11 NOTE — Progress Notes (Signed)
Rx lidocaine crm for vaginal burning sensation.

## 2021-02-11 NOTE — Telephone Encounter (Addendum)
Spoke with daughter. Pt is still having vaginal burning pain, calling her in AM. Pt's daughter and PCP trying to connect. I had suggested trying gabapentin with PCP. LM for PCP, Diana Lester at [facility]. [phone]. Notes also faxed to her. Will try topical lidocaine prn to numb area in meantime. Rx eRxd. F/u prn.

## 2021-02-12 DIAGNOSIS — F411 Generalized anxiety disorder: Secondary | ICD-10-CM | POA: Diagnosis not present

## 2021-02-12 DIAGNOSIS — N760 Acute vaginitis: Secondary | ICD-10-CM | POA: Diagnosis not present

## 2021-02-16 NOTE — Telephone Encounter (Signed)
Spoke with patients sister (daughter is not listed). Confirmed with sister that patient is currently being followed by doctors making house calls at home place and seeing gyn. Nothing needed from [place].

## 2021-02-16 NOTE — Telephone Encounter (Signed)
I called number listed as person that called and made request this is a sister not daughter no answer and no return call.

## 2021-02-26 DIAGNOSIS — N771 Vaginitis, vulvitis and vulvovaginitis in diseases classified elsewhere: Secondary | ICD-10-CM | POA: Diagnosis not present

## 2021-02-26 DIAGNOSIS — G894 Chronic pain syndrome: Secondary | ICD-10-CM | POA: Diagnosis not present

## 2021-03-05 DIAGNOSIS — I10 Essential (primary) hypertension: Secondary | ICD-10-CM | POA: Diagnosis not present

## 2021-03-05 DIAGNOSIS — F419 Anxiety disorder, unspecified: Secondary | ICD-10-CM | POA: Diagnosis not present

## 2021-03-06 DIAGNOSIS — F02A4 Dementia in other diseases classified elsewhere, mild, with anxiety: Secondary | ICD-10-CM | POA: Diagnosis not present

## 2021-03-06 DIAGNOSIS — G301 Alzheimer's disease with late onset: Secondary | ICD-10-CM | POA: Diagnosis not present

## 2021-03-06 DIAGNOSIS — F331 Major depressive disorder, recurrent, moderate: Secondary | ICD-10-CM | POA: Diagnosis not present

## 2021-03-06 DIAGNOSIS — F411 Generalized anxiety disorder: Secondary | ICD-10-CM | POA: Diagnosis not present

## 2021-03-12 ENCOUNTER — Encounter: Payer: Self-pay | Admitting: Obstetrics and Gynecology

## 2021-03-19 DIAGNOSIS — I739 Peripheral vascular disease, unspecified: Secondary | ICD-10-CM | POA: Diagnosis not present

## 2021-03-19 DIAGNOSIS — N771 Vaginitis, vulvitis and vulvovaginitis in diseases classified elsewhere: Secondary | ICD-10-CM | POA: Diagnosis not present

## 2021-03-19 DIAGNOSIS — F411 Generalized anxiety disorder: Secondary | ICD-10-CM | POA: Diagnosis not present

## 2021-03-19 DIAGNOSIS — N184 Chronic kidney disease, stage 4 (severe): Secondary | ICD-10-CM | POA: Diagnosis not present

## 2021-03-19 DIAGNOSIS — I129 Hypertensive chronic kidney disease with stage 1 through stage 4 chronic kidney disease, or unspecified chronic kidney disease: Secondary | ICD-10-CM | POA: Diagnosis not present

## 2021-03-19 DIAGNOSIS — F331 Major depressive disorder, recurrent, moderate: Secondary | ICD-10-CM | POA: Diagnosis not present

## 2021-03-19 DIAGNOSIS — M79675 Pain in left toe(s): Secondary | ICD-10-CM | POA: Diagnosis not present

## 2021-03-23 DIAGNOSIS — N184 Chronic kidney disease, stage 4 (severe): Secondary | ICD-10-CM | POA: Diagnosis not present

## 2021-03-23 DIAGNOSIS — I129 Hypertensive chronic kidney disease with stage 1 through stage 4 chronic kidney disease, or unspecified chronic kidney disease: Secondary | ICD-10-CM | POA: Diagnosis not present

## 2021-03-23 DIAGNOSIS — G894 Chronic pain syndrome: Secondary | ICD-10-CM | POA: Diagnosis not present

## 2021-03-23 DIAGNOSIS — D508 Other iron deficiency anemias: Secondary | ICD-10-CM | POA: Diagnosis not present

## 2021-03-26 DIAGNOSIS — G894 Chronic pain syndrome: Secondary | ICD-10-CM | POA: Diagnosis not present

## 2021-03-26 DIAGNOSIS — R635 Abnormal weight gain: Secondary | ICD-10-CM | POA: Diagnosis not present

## 2021-03-26 DIAGNOSIS — N1832 Chronic kidney disease, stage 3b: Secondary | ICD-10-CM | POA: Diagnosis not present

## 2021-03-26 DIAGNOSIS — N771 Vaginitis, vulvitis and vulvovaginitis in diseases classified elsewhere: Secondary | ICD-10-CM | POA: Diagnosis not present

## 2021-04-03 DIAGNOSIS — G301 Alzheimer's disease with late onset: Secondary | ICD-10-CM | POA: Diagnosis not present

## 2021-04-03 DIAGNOSIS — F331 Major depressive disorder, recurrent, moderate: Secondary | ICD-10-CM | POA: Diagnosis not present

## 2021-04-03 DIAGNOSIS — F411 Generalized anxiety disorder: Secondary | ICD-10-CM | POA: Diagnosis not present

## 2021-04-03 DIAGNOSIS — F02A4 Dementia in other diseases classified elsewhere, mild, with anxiety: Secondary | ICD-10-CM | POA: Diagnosis not present

## 2021-04-16 DIAGNOSIS — M79675 Pain in left toe(s): Secondary | ICD-10-CM | POA: Diagnosis not present

## 2021-04-16 DIAGNOSIS — N1832 Chronic kidney disease, stage 3b: Secondary | ICD-10-CM | POA: Diagnosis not present

## 2021-04-16 DIAGNOSIS — F411 Generalized anxiety disorder: Secondary | ICD-10-CM | POA: Diagnosis not present

## 2021-04-16 DIAGNOSIS — I129 Hypertensive chronic kidney disease with stage 1 through stage 4 chronic kidney disease, or unspecified chronic kidney disease: Secondary | ICD-10-CM | POA: Diagnosis not present

## 2021-04-16 DIAGNOSIS — N771 Vaginitis, vulvitis and vulvovaginitis in diseases classified elsewhere: Secondary | ICD-10-CM | POA: Diagnosis not present

## 2021-04-16 DIAGNOSIS — D508 Other iron deficiency anemias: Secondary | ICD-10-CM | POA: Diagnosis not present

## 2021-04-16 DIAGNOSIS — F331 Major depressive disorder, recurrent, moderate: Secondary | ICD-10-CM | POA: Diagnosis not present

## 2021-04-21 DIAGNOSIS — B070 Plantar wart: Secondary | ICD-10-CM | POA: Diagnosis not present

## 2021-04-21 DIAGNOSIS — L851 Acquired keratosis [keratoderma] palmaris et plantaris: Secondary | ICD-10-CM | POA: Diagnosis not present

## 2021-04-21 DIAGNOSIS — B351 Tinea unguium: Secondary | ICD-10-CM | POA: Diagnosis not present

## 2021-04-21 DIAGNOSIS — R2681 Unsteadiness on feet: Secondary | ICD-10-CM | POA: Diagnosis not present

## 2021-04-21 DIAGNOSIS — M79675 Pain in left toe(s): Secondary | ICD-10-CM | POA: Diagnosis not present

## 2021-04-21 DIAGNOSIS — L605 Yellow nail syndrome: Secondary | ICD-10-CM | POA: Diagnosis not present

## 2021-04-21 DIAGNOSIS — N183 Chronic kidney disease, stage 3 unspecified: Secondary | ICD-10-CM | POA: Diagnosis not present

## 2021-04-21 DIAGNOSIS — M79674 Pain in right toe(s): Secondary | ICD-10-CM | POA: Diagnosis not present

## 2021-04-23 DIAGNOSIS — N771 Vaginitis, vulvitis and vulvovaginitis in diseases classified elsewhere: Secondary | ICD-10-CM | POA: Diagnosis not present

## 2021-04-23 DIAGNOSIS — N1832 Chronic kidney disease, stage 3b: Secondary | ICD-10-CM | POA: Diagnosis not present

## 2021-04-23 DIAGNOSIS — G894 Chronic pain syndrome: Secondary | ICD-10-CM | POA: Diagnosis not present

## 2021-04-23 DIAGNOSIS — M545 Low back pain, unspecified: Secondary | ICD-10-CM | POA: Diagnosis not present

## 2021-04-29 DIAGNOSIS — F419 Anxiety disorder, unspecified: Secondary | ICD-10-CM | POA: Diagnosis not present

## 2021-04-29 DIAGNOSIS — I10 Essential (primary) hypertension: Secondary | ICD-10-CM | POA: Diagnosis not present

## 2021-04-30 DIAGNOSIS — G301 Alzheimer's disease with late onset: Secondary | ICD-10-CM | POA: Diagnosis not present

## 2021-04-30 DIAGNOSIS — F331 Major depressive disorder, recurrent, moderate: Secondary | ICD-10-CM | POA: Diagnosis not present

## 2021-04-30 DIAGNOSIS — F02A4 Dementia in other diseases classified elsewhere, mild, with anxiety: Secondary | ICD-10-CM | POA: Diagnosis not present

## 2021-04-30 DIAGNOSIS — F411 Generalized anxiety disorder: Secondary | ICD-10-CM | POA: Diagnosis not present

## 2021-05-14 DIAGNOSIS — I10 Essential (primary) hypertension: Secondary | ICD-10-CM | POA: Diagnosis not present

## 2021-05-14 DIAGNOSIS — F02A4 Dementia in other diseases classified elsewhere, mild, with anxiety: Secondary | ICD-10-CM | POA: Diagnosis not present

## 2021-05-14 DIAGNOSIS — E782 Mixed hyperlipidemia: Secondary | ICD-10-CM | POA: Diagnosis not present

## 2021-05-14 DIAGNOSIS — G301 Alzheimer's disease with late onset: Secondary | ICD-10-CM | POA: Diagnosis not present

## 2021-05-14 DIAGNOSIS — N771 Vaginitis, vulvitis and vulvovaginitis in diseases classified elsewhere: Secondary | ICD-10-CM | POA: Diagnosis not present

## 2021-05-14 DIAGNOSIS — F331 Major depressive disorder, recurrent, moderate: Secondary | ICD-10-CM | POA: Diagnosis not present

## 2021-06-18 DIAGNOSIS — I129 Hypertensive chronic kidney disease with stage 1 through stage 4 chronic kidney disease, or unspecified chronic kidney disease: Secondary | ICD-10-CM | POA: Diagnosis not present

## 2021-06-18 DIAGNOSIS — N771 Vaginitis, vulvitis and vulvovaginitis in diseases classified elsewhere: Secondary | ICD-10-CM | POA: Diagnosis not present

## 2021-06-18 DIAGNOSIS — M545 Low back pain, unspecified: Secondary | ICD-10-CM | POA: Diagnosis not present

## 2021-06-18 DIAGNOSIS — N184 Chronic kidney disease, stage 4 (severe): Secondary | ICD-10-CM | POA: Diagnosis not present

## 2021-06-18 DIAGNOSIS — G894 Chronic pain syndrome: Secondary | ICD-10-CM | POA: Diagnosis not present

## 2021-06-18 DIAGNOSIS — R0981 Nasal congestion: Secondary | ICD-10-CM | POA: Diagnosis not present

## 2021-06-18 DIAGNOSIS — F419 Anxiety disorder, unspecified: Secondary | ICD-10-CM | POA: Diagnosis not present

## 2021-06-19 DIAGNOSIS — I120 Hypertensive chronic kidney disease with stage 5 chronic kidney disease or end stage renal disease: Secondary | ICD-10-CM | POA: Diagnosis not present

## 2021-07-07 DIAGNOSIS — I129 Hypertensive chronic kidney disease with stage 1 through stage 4 chronic kidney disease, or unspecified chronic kidney disease: Secondary | ICD-10-CM | POA: Diagnosis not present

## 2021-07-07 DIAGNOSIS — N1832 Chronic kidney disease, stage 3b: Secondary | ICD-10-CM | POA: Diagnosis not present

## 2021-07-07 DIAGNOSIS — F331 Major depressive disorder, recurrent, moderate: Secondary | ICD-10-CM | POA: Diagnosis not present

## 2021-07-07 DIAGNOSIS — M545 Low back pain, unspecified: Secondary | ICD-10-CM | POA: Diagnosis not present

## 2021-07-13 DIAGNOSIS — M5416 Radiculopathy, lumbar region: Secondary | ICD-10-CM | POA: Diagnosis not present

## 2021-07-13 DIAGNOSIS — M48062 Spinal stenosis, lumbar region with neurogenic claudication: Secondary | ICD-10-CM | POA: Diagnosis not present

## 2021-07-13 DIAGNOSIS — M62830 Muscle spasm of back: Secondary | ICD-10-CM | POA: Diagnosis not present

## 2021-07-16 DIAGNOSIS — F411 Generalized anxiety disorder: Secondary | ICD-10-CM | POA: Diagnosis not present

## 2021-07-16 DIAGNOSIS — F02A4 Dementia in other diseases classified elsewhere, mild, with anxiety: Secondary | ICD-10-CM | POA: Diagnosis not present

## 2021-07-16 DIAGNOSIS — G301 Alzheimer's disease with late onset: Secondary | ICD-10-CM | POA: Diagnosis not present

## 2021-07-16 DIAGNOSIS — F331 Major depressive disorder, recurrent, moderate: Secondary | ICD-10-CM | POA: Diagnosis not present

## 2021-07-17 DIAGNOSIS — M545 Low back pain, unspecified: Secondary | ICD-10-CM | POA: Diagnosis not present

## 2021-07-17 DIAGNOSIS — M48061 Spinal stenosis, lumbar region without neurogenic claudication: Secondary | ICD-10-CM | POA: Diagnosis not present

## 2021-07-22 DIAGNOSIS — M48062 Spinal stenosis, lumbar region with neurogenic claudication: Secondary | ICD-10-CM | POA: Diagnosis not present

## 2021-07-22 DIAGNOSIS — M5416 Radiculopathy, lumbar region: Secondary | ICD-10-CM | POA: Diagnosis not present

## 2021-07-24 DIAGNOSIS — B351 Tinea unguium: Secondary | ICD-10-CM | POA: Diagnosis not present

## 2021-07-24 DIAGNOSIS — L851 Acquired keratosis [keratoderma] palmaris et plantaris: Secondary | ICD-10-CM | POA: Diagnosis not present

## 2021-07-24 DIAGNOSIS — I739 Peripheral vascular disease, unspecified: Secondary | ICD-10-CM | POA: Diagnosis not present

## 2021-07-24 DIAGNOSIS — R269 Unspecified abnormalities of gait and mobility: Secondary | ICD-10-CM | POA: Diagnosis not present

## 2021-07-24 DIAGNOSIS — L603 Nail dystrophy: Secondary | ICD-10-CM | POA: Diagnosis not present

## 2021-07-24 DIAGNOSIS — M79674 Pain in right toe(s): Secondary | ICD-10-CM | POA: Diagnosis not present

## 2021-07-24 DIAGNOSIS — M79675 Pain in left toe(s): Secondary | ICD-10-CM | POA: Diagnosis not present

## 2021-07-24 DIAGNOSIS — L600 Ingrowing nail: Secondary | ICD-10-CM | POA: Diagnosis not present

## 2021-08-06 ENCOUNTER — Emergency Department: Payer: PPO

## 2021-08-06 ENCOUNTER — Emergency Department
Admission: EM | Admit: 2021-08-06 | Discharge: 2021-08-06 | Disposition: A | Discharge status: Home / Self Care | Payer: PPO | Attending: Emergency Medicine | Admitting: Emergency Medicine

## 2021-08-06 ENCOUNTER — Other Ambulatory Visit: Payer: Self-pay

## 2021-08-06 DIAGNOSIS — R52 Pain, unspecified: Secondary | ICD-10-CM | POA: Diagnosis not present

## 2021-08-06 DIAGNOSIS — I672 Cerebral atherosclerosis: Secondary | ICD-10-CM | POA: Diagnosis not present

## 2021-08-06 DIAGNOSIS — D181 Lymphangioma, any site: Secondary | ICD-10-CM | POA: Diagnosis not present

## 2021-08-06 DIAGNOSIS — S0181XA Laceration without foreign body of other part of head, initial encounter: Secondary | ICD-10-CM | POA: Diagnosis not present

## 2021-08-06 DIAGNOSIS — W19XXXA Unspecified fall, initial encounter: Secondary | ICD-10-CM | POA: Insufficient documentation

## 2021-08-06 DIAGNOSIS — S0990XA Unspecified injury of head, initial encounter: Secondary | ICD-10-CM | POA: Diagnosis not present

## 2021-08-06 DIAGNOSIS — S0592XA Unspecified injury of left eye and orbit, initial encounter: Secondary | ICD-10-CM | POA: Diagnosis present

## 2021-08-06 DIAGNOSIS — I10 Essential (primary) hypertension: Secondary | ICD-10-CM | POA: Diagnosis not present

## 2021-08-06 DIAGNOSIS — I959 Hypotension, unspecified: Secondary | ICD-10-CM | POA: Diagnosis not present

## 2021-08-06 DIAGNOSIS — Z981 Arthrodesis status: Secondary | ICD-10-CM | POA: Diagnosis not present

## 2021-08-06 DIAGNOSIS — R42 Dizziness and giddiness: Secondary | ICD-10-CM | POA: Diagnosis not present

## 2021-08-06 DIAGNOSIS — S01112A Laceration without foreign body of left eyelid and periocular area, initial encounter: Secondary | ICD-10-CM | POA: Insufficient documentation

## 2021-08-06 DIAGNOSIS — Y92129 Unspecified place in nursing home as the place of occurrence of the external cause: Secondary | ICD-10-CM | POA: Diagnosis not present

## 2021-08-06 DIAGNOSIS — Z23 Encounter for immunization: Secondary | ICD-10-CM | POA: Diagnosis not present

## 2021-08-06 DIAGNOSIS — M419 Scoliosis, unspecified: Secondary | ICD-10-CM | POA: Diagnosis not present

## 2021-08-06 DIAGNOSIS — F0390 Unspecified dementia without behavioral disturbance: Secondary | ICD-10-CM | POA: Diagnosis not present

## 2021-08-06 DIAGNOSIS — S199XXA Unspecified injury of neck, initial encounter: Secondary | ICD-10-CM | POA: Diagnosis not present

## 2021-08-06 DIAGNOSIS — M47812 Spondylosis without myelopathy or radiculopathy, cervical region: Secondary | ICD-10-CM | POA: Diagnosis not present

## 2021-08-06 DIAGNOSIS — R55 Syncope and collapse: Secondary | ICD-10-CM | POA: Diagnosis not present

## 2021-08-06 LAB — URINALYSIS, ROUTINE W REFLEX MICROSCOPIC
Bilirubin Urine: NEGATIVE
Glucose, UA: NEGATIVE mg/dL
Hgb urine dipstick: NEGATIVE
Ketones, ur: NEGATIVE mg/dL
Leukocytes,Ua: NEGATIVE
Nitrite: NEGATIVE
Protein, ur: NEGATIVE mg/dL
Specific Gravity, Urine: 1.02 (ref 1.005–1.030)
pH: 6 (ref 5.0–8.0)

## 2021-08-06 LAB — BASIC METABOLIC PANEL
Anion gap: 8 (ref 5–15)
BUN: 21 mg/dL (ref 8–23)
CO2: 28 mmol/L (ref 22–32)
Calcium: 9.5 mg/dL (ref 8.9–10.3)
Chloride: 100 mmol/L (ref 98–111)
Creatinine, Ser: 1.3 mg/dL — ABNORMAL HIGH (ref 0.44–1.00)
GFR, Estimated: 40 mL/min — ABNORMAL LOW (ref >60)
Glucose, Bld: 109 mg/dL — ABNORMAL HIGH (ref 70–99)
Potassium: 4.2 mmol/L (ref 3.5–5.1)
Sodium: 136 mmol/L (ref 135–145)

## 2021-08-06 LAB — CBC
HCT: 45.3 % (ref 36.0–46.0)
Hemoglobin: 14.3 g/dL (ref 12.0–15.0)
MCH: 28.9 pg (ref 26.0–34.0)
MCHC: 31.6 g/dL (ref 30.0–36.0)
MCV: 91.5 fL (ref 80.0–100.0)
Platelets: 314 10*3/uL (ref 150–400)
RBC: 4.95 MIL/uL (ref 3.87–5.11)
RDW: 13.2 % (ref 11.5–15.5)
WBC: 10.3 10*3/uL (ref 4.0–10.5)
nRBC: 0 % (ref 0.0–0.2)

## 2021-08-06 MED ORDER — BACITRACIN ZINC 500 UNIT/GM EX OINT: TOPICAL_OINTMENT | CUTANEOUS | 0 refills | Status: AC

## 2021-08-06 MED ORDER — TETANUS-DIPHTH-ACELL PERTUSSIS 5-2.5-18.5 LF-MCG/0.5 IM SUSY
0.5000 mL | PREFILLED_SYRINGE | Freq: Once | INTRAMUSCULAR | Status: AC
  Administered 2021-08-06: 0.5 mL via INTRAMUSCULAR
  Filled 2021-08-06: qty 0.5

## 2021-08-06 NOTE — ED Triage Notes (Signed)
Pt comes into the ED via EMS from [place] fall?/syncope, pt does not remember what happened, pt has a laceration above the left eye and bruising below the eye. Denies any other injury at this time, denies any neck or other pain/injury.    150/59 [id number]

## 2021-08-06 NOTE — Discharge Instructions (Signed)
Please apply bacitracin to abrasion on left cheek twice daily x7 days.  Please keep the Steri-Strips from getting wet for the next 3 to 4 days.  After which she may get them wet but allow area to air dry, do not rub.  Steri-Strips will likely fall off after getting wet.  Please follow-up with your doctor for recheck/reevaluation.  Return to the emergency department for any symptom personally concerning to yourself.  Your tetanus shot has been updated today in the emergency department.

## 2021-08-06 NOTE — ED Provider Notes (Signed)
[facility] Provider Note    Event Date/Time   First MD Initiated Contact with Patient 08/06/21 1224     (approximate)  History   Chief Complaint: Loss of Consciousness  HPI  Diana Lester is a 88 y.o. female with a past medical history of arthritis, depression, hypertension, hyperlipidemia, presents to the emergency department after a fall.  According to report patient is coming from her nursing facility after a fall.  Patient does not recall the fall but has mild dementia as well.  Here patient has no complaints.  No pain.  Patient does have a small laceration above the left eye as well as a small abrasion below the left eye.  She is calm cooperative no distress.  Physical Exam   Triage Vital Signs: ED Triage Vitals  Enc Vitals Group     BP 08/06/21 1125 138/74     Pulse Rate 08/06/21 1125 73     Resp 08/06/21 1125 16     Temp 08/06/21 1125 98.1 F (36.7 C)     Temp Source 08/06/21 1125 Oral     SpO2 08/06/21 1125 98 %     Weight 08/06/21 1126 120 lb (54.4 kg)     Height 08/06/21 1126 '5\' 1"'$  (1.549 m)     Head Circumference --      Peak Flow --      Pain Score 08/06/21 1126 5     Pain Loc --      Pain Edu? --      Excl. in [place]? --     Most recent vital signs: Vitals:   08/06/21 1125  BP: 138/74  Pulse: 73  Resp: 16  Temp: 98.1 F (36.7 C)  SpO2: 98%    General: Awake, no distress.  CV:  Good peripheral perfusion.  Regular rate and rhythm  Resp:  Normal effort.  Equal breath sounds bilaterally.  Abd:  No distention.  Soft, nontender.  No rebound or guarding. Other:  Patient has approximately 2 cm laceration just above the left eyebrow as well as a small abrasion just below the left eye.  Left eye itself is normal.  Great range of motion in all extremities with no pain elicited.   ED Results / Procedures / Treatments   EKG  EKG viewed and interpreted by myself shows a normal sinus rhythm at 72 bpm with a narrow QRS, normal axis,  normal intervals, no concerning ST changes.  RADIOLOGY  I personally reviewed the CT images of the head no large bleed seen on my evaluation.  CT scan of the head and C-spine read as negative for intracranial abnormality by radiology   MEDICATIONS ORDERED IN ED: Medications - No data to display   IMPRESSION / MDM / [place] / ED COURSE  I reviewed the triage vital signs and the nursing notes.  Patient's presentation is most consistent with acute presentation with potential threat to life or bodily function.  Patient presents to the emergency department after a fall at her nursing facility.  Patient has an approximate 2 cm laceration above the left eye that is nongaping and hemostatic.  Small abrasion below the left eye.  Patient's work-up is otherwise reassuring, CT scan of the head and C-spine are normal.  Lab work shows no acute abnormality.  CBC is normal, chemistry is reassuring with a normal urinalysis.  Given the patient's laceration we will apply Steri-Strips, patient has an unknown last tetanus we will update the patient's  tetanus prior to discharge.  We will prescribe bacitracin for the patient.  Patient to follow-up with her doctor.  FINAL CLINICAL IMPRESSION(S) / ED DIAGNOSES   Fall Head injury Laceration    Note:  This document was prepared using Dragon voice recognition software and may include unintentional dictation errors.   Harvest Dark, MD 08/06/21 1317

## 2021-08-07 DIAGNOSIS — M48061 Spinal stenosis, lumbar region without neurogenic claudication: Secondary | ICD-10-CM | POA: Diagnosis not present

## 2021-08-07 DIAGNOSIS — M545 Low back pain, unspecified: Secondary | ICD-10-CM | POA: Diagnosis not present

## 2021-08-10 DIAGNOSIS — H524 Presbyopia: Secondary | ICD-10-CM | POA: Diagnosis not present

## 2021-08-13 DIAGNOSIS — H6123 Impacted cerumen, bilateral: Secondary | ICD-10-CM | POA: Diagnosis not present

## 2021-08-13 DIAGNOSIS — S0182XD Laceration with foreign body of other part of head, subsequent encounter: Secondary | ICD-10-CM | POA: Diagnosis not present

## 2021-08-13 DIAGNOSIS — R0981 Nasal congestion: Secondary | ICD-10-CM | POA: Diagnosis not present

## 2021-08-13 DIAGNOSIS — H9209 Otalgia, unspecified ear: Secondary | ICD-10-CM | POA: Diagnosis not present

## 2021-08-13 DIAGNOSIS — F411 Generalized anxiety disorder: Secondary | ICD-10-CM | POA: Diagnosis not present

## 2021-08-13 DIAGNOSIS — W0110XD Fall on same level from slipping, tripping and stumbling with subsequent striking against unspecified object, subsequent encounter: Secondary | ICD-10-CM | POA: Diagnosis not present

## 2021-08-13 DIAGNOSIS — E559 Vitamin D deficiency, unspecified: Secondary | ICD-10-CM | POA: Diagnosis not present

## 2021-08-13 DIAGNOSIS — G894 Chronic pain syndrome: Secondary | ICD-10-CM | POA: Diagnosis not present

## 2021-08-13 DIAGNOSIS — M545 Low back pain, unspecified: Secondary | ICD-10-CM | POA: Diagnosis not present

## 2021-08-17 DIAGNOSIS — M48062 Spinal stenosis, lumbar region with neurogenic claudication: Secondary | ICD-10-CM | POA: Diagnosis not present

## 2021-08-17 DIAGNOSIS — M5416 Radiculopathy, lumbar region: Secondary | ICD-10-CM | POA: Diagnosis not present

## 2021-08-17 DIAGNOSIS — M62830 Muscle spasm of back: Secondary | ICD-10-CM | POA: Diagnosis not present

## 2021-08-25 ENCOUNTER — Other Ambulatory Visit: Payer: Self-pay | Admitting: Obstetrics and Gynecology

## 2021-08-25 DIAGNOSIS — N949 Unspecified condition associated with female genital organs and menstrual cycle: Secondary | ICD-10-CM

## 2021-08-25 MED ORDER — LIDOCAINE 5 % EX OINT: 1.0000 | TOPICAL_OINTMENT | CUTANEOUS | 0 refills | Status: DC | PRN

## 2021-08-25 NOTE — Progress Notes (Signed)
Rx RF lidocaine oint for ext vag burning sensation after neg eval

## 2021-09-10 DIAGNOSIS — F331 Major depressive disorder, recurrent, moderate: Secondary | ICD-10-CM | POA: Diagnosis not present

## 2021-09-10 DIAGNOSIS — M545 Low back pain, unspecified: Secondary | ICD-10-CM | POA: Diagnosis not present

## 2021-09-10 DIAGNOSIS — F02A4 Dementia in other diseases classified elsewhere, mild, with anxiety: Secondary | ICD-10-CM | POA: Diagnosis not present

## 2021-09-10 DIAGNOSIS — I739 Peripheral vascular disease, unspecified: Secondary | ICD-10-CM | POA: Diagnosis not present

## 2021-09-10 DIAGNOSIS — G301 Alzheimer's disease with late onset: Secondary | ICD-10-CM | POA: Diagnosis not present

## 2021-09-10 DIAGNOSIS — Z6823 Body mass index (BMI) 23.0-23.9, adult: Secondary | ICD-10-CM | POA: Diagnosis not present

## 2021-09-10 DIAGNOSIS — G894 Chronic pain syndrome: Secondary | ICD-10-CM | POA: Diagnosis not present

## 2021-09-21 DIAGNOSIS — M48062 Spinal stenosis, lumbar region with neurogenic claudication: Secondary | ICD-10-CM | POA: Diagnosis not present

## 2021-09-21 DIAGNOSIS — M62830 Muscle spasm of back: Secondary | ICD-10-CM | POA: Diagnosis not present

## 2021-09-21 DIAGNOSIS — M5416 Radiculopathy, lumbar region: Secondary | ICD-10-CM | POA: Diagnosis not present

## 2021-10-19 DIAGNOSIS — M48062 Spinal stenosis, lumbar region with neurogenic claudication: Secondary | ICD-10-CM | POA: Diagnosis not present

## 2021-10-19 DIAGNOSIS — M5416 Radiculopathy, lumbar region: Secondary | ICD-10-CM | POA: Diagnosis not present

## 2021-10-19 DIAGNOSIS — M62830 Muscle spasm of back: Secondary | ICD-10-CM | POA: Diagnosis not present

## 2021-10-26 DIAGNOSIS — G301 Alzheimer's disease with late onset: Secondary | ICD-10-CM | POA: Diagnosis not present

## 2021-10-26 DIAGNOSIS — M545 Low back pain, unspecified: Secondary | ICD-10-CM | POA: Diagnosis not present

## 2021-10-26 DIAGNOSIS — K5904 Chronic idiopathic constipation: Secondary | ICD-10-CM | POA: Diagnosis not present

## 2021-10-26 DIAGNOSIS — Z79890 Hormone replacement therapy (postmenopausal): Secondary | ICD-10-CM | POA: Diagnosis not present

## 2021-10-26 DIAGNOSIS — F0280 Dementia in other diseases classified elsewhere without behavioral disturbance: Secondary | ICD-10-CM | POA: Diagnosis not present

## 2021-11-05 DIAGNOSIS — G301 Alzheimer's disease with late onset: Secondary | ICD-10-CM | POA: Diagnosis not present

## 2021-11-05 DIAGNOSIS — F411 Generalized anxiety disorder: Secondary | ICD-10-CM | POA: Diagnosis not present

## 2021-11-05 DIAGNOSIS — F02A4 Dementia in other diseases classified elsewhere, mild, with anxiety: Secondary | ICD-10-CM | POA: Diagnosis not present

## 2021-11-05 DIAGNOSIS — F331 Major depressive disorder, recurrent, moderate: Secondary | ICD-10-CM | POA: Diagnosis not present

## 2021-11-12 DIAGNOSIS — R4182 Altered mental status, unspecified: Secondary | ICD-10-CM | POA: Diagnosis not present

## 2021-11-12 DIAGNOSIS — J069 Acute upper respiratory infection, unspecified: Secondary | ICD-10-CM | POA: Diagnosis not present

## 2021-11-12 DIAGNOSIS — M545 Low back pain, unspecified: Secondary | ICD-10-CM | POA: Diagnosis not present

## 2021-11-12 DIAGNOSIS — N1832 Chronic kidney disease, stage 3b: Secondary | ICD-10-CM | POA: Diagnosis not present

## 2021-11-12 DIAGNOSIS — G894 Chronic pain syndrome: Secondary | ICD-10-CM | POA: Diagnosis not present

## 2021-11-12 DIAGNOSIS — H6121 Impacted cerumen, right ear: Secondary | ICD-10-CM | POA: Diagnosis not present

## 2021-11-16 DIAGNOSIS — M48062 Spinal stenosis, lumbar region with neurogenic claudication: Secondary | ICD-10-CM | POA: Diagnosis not present

## 2021-11-16 DIAGNOSIS — M62830 Muscle spasm of back: Secondary | ICD-10-CM | POA: Diagnosis not present

## 2021-11-16 DIAGNOSIS — M5416 Radiculopathy, lumbar region: Secondary | ICD-10-CM | POA: Diagnosis not present

## 2021-11-25 DIAGNOSIS — R300 Dysuria: Secondary | ICD-10-CM | POA: Diagnosis not present

## 2021-11-27 DIAGNOSIS — G301 Alzheimer's disease with late onset: Secondary | ICD-10-CM | POA: Diagnosis not present

## 2021-11-27 DIAGNOSIS — F411 Generalized anxiety disorder: Secondary | ICD-10-CM | POA: Diagnosis not present

## 2021-11-27 DIAGNOSIS — F02A4 Dementia in other diseases classified elsewhere, mild, with anxiety: Secondary | ICD-10-CM | POA: Diagnosis not present

## 2021-11-27 DIAGNOSIS — F331 Major depressive disorder, recurrent, moderate: Secondary | ICD-10-CM | POA: Diagnosis not present

## 2021-12-02 DIAGNOSIS — I10 Essential (primary) hypertension: Secondary | ICD-10-CM | POA: Diagnosis not present

## 2021-12-02 DIAGNOSIS — F419 Anxiety disorder, unspecified: Secondary | ICD-10-CM | POA: Diagnosis not present

## 2021-12-07 DIAGNOSIS — H903 Sensorineural hearing loss, bilateral: Secondary | ICD-10-CM | POA: Diagnosis not present

## 2021-12-07 DIAGNOSIS — H6123 Impacted cerumen, bilateral: Secondary | ICD-10-CM | POA: Diagnosis not present

## 2021-12-14 DIAGNOSIS — M62830 Muscle spasm of back: Secondary | ICD-10-CM | POA: Diagnosis not present

## 2021-12-14 DIAGNOSIS — M48062 Spinal stenosis, lumbar region with neurogenic claudication: Secondary | ICD-10-CM | POA: Diagnosis not present

## 2021-12-14 DIAGNOSIS — M5416 Radiculopathy, lumbar region: Secondary | ICD-10-CM | POA: Diagnosis not present

## 2021-12-24 DIAGNOSIS — I120 Hypertensive chronic kidney disease with stage 5 chronic kidney disease or end stage renal disease: Secondary | ICD-10-CM | POA: Diagnosis not present

## 2021-12-24 DIAGNOSIS — E559 Vitamin D deficiency, unspecified: Secondary | ICD-10-CM | POA: Diagnosis not present

## 2021-12-30 DIAGNOSIS — G301 Alzheimer's disease with late onset: Secondary | ICD-10-CM | POA: Diagnosis not present

## 2021-12-30 DIAGNOSIS — F02A4 Dementia in other diseases classified elsewhere, mild, with anxiety: Secondary | ICD-10-CM | POA: Diagnosis not present

## 2021-12-30 DIAGNOSIS — F331 Major depressive disorder, recurrent, moderate: Secondary | ICD-10-CM | POA: Diagnosis not present

## 2021-12-30 DIAGNOSIS — F411 Generalized anxiety disorder: Secondary | ICD-10-CM | POA: Diagnosis not present

## 2021-12-31 DIAGNOSIS — G894 Chronic pain syndrome: Secondary | ICD-10-CM | POA: Diagnosis not present

## 2021-12-31 DIAGNOSIS — N1832 Chronic kidney disease, stage 3b: Secondary | ICD-10-CM | POA: Diagnosis not present

## 2021-12-31 DIAGNOSIS — R4182 Altered mental status, unspecified: Secondary | ICD-10-CM | POA: Diagnosis not present

## 2021-12-31 DIAGNOSIS — R269 Unspecified abnormalities of gait and mobility: Secondary | ICD-10-CM | POA: Diagnosis not present

## 2021-12-31 DIAGNOSIS — M4800 Spinal stenosis, site unspecified: Secondary | ICD-10-CM | POA: Diagnosis not present

## 2021-12-31 DIAGNOSIS — D649 Anemia, unspecified: Secondary | ICD-10-CM | POA: Diagnosis not present

## 2021-12-31 DIAGNOSIS — E559 Vitamin D deficiency, unspecified: Secondary | ICD-10-CM | POA: Diagnosis not present

## 2021-12-31 DIAGNOSIS — Z6823 Body mass index (BMI) 23.0-23.9, adult: Secondary | ICD-10-CM | POA: Diagnosis not present

## 2021-12-31 DIAGNOSIS — M545 Low back pain, unspecified: Secondary | ICD-10-CM | POA: Diagnosis not present

## 2022-01-04 ENCOUNTER — Emergency Department: Payer: PPO

## 2022-01-04 ENCOUNTER — Emergency Department
Admission: EM | Admit: 2022-01-04 | Discharge: 2022-01-04 | Disposition: A | Discharge status: Home / Self Care | Payer: PPO | Attending: Emergency Medicine | Admitting: Emergency Medicine

## 2022-01-04 ENCOUNTER — Encounter (INDEPENDENT_AMBULATORY_CARE_PROVIDER_SITE_OTHER): Payer: Self-pay

## 2022-01-04 ENCOUNTER — Other Ambulatory Visit: Payer: Self-pay

## 2022-01-04 DIAGNOSIS — D72829 Elevated white blood cell count, unspecified: Secondary | ICD-10-CM | POA: Diagnosis not present

## 2022-01-04 DIAGNOSIS — G8929 Other chronic pain: Secondary | ICD-10-CM | POA: Insufficient documentation

## 2022-01-04 DIAGNOSIS — M4126 Other idiopathic scoliosis, lumbar region: Secondary | ICD-10-CM | POA: Diagnosis not present

## 2022-01-04 DIAGNOSIS — W19XXXA Unspecified fall, initial encounter: Secondary | ICD-10-CM | POA: Diagnosis not present

## 2022-01-04 DIAGNOSIS — I10 Essential (primary) hypertension: Secondary | ICD-10-CM | POA: Diagnosis not present

## 2022-01-04 DIAGNOSIS — R410 Disorientation, unspecified: Secondary | ICD-10-CM | POA: Diagnosis not present

## 2022-01-04 DIAGNOSIS — R1030 Lower abdominal pain, unspecified: Secondary | ICD-10-CM | POA: Insufficient documentation

## 2022-01-04 DIAGNOSIS — M47812 Spondylosis without myelopathy or radiculopathy, cervical region: Secondary | ICD-10-CM | POA: Diagnosis not present

## 2022-01-04 DIAGNOSIS — M5459 Other low back pain: Secondary | ICD-10-CM | POA: Diagnosis not present

## 2022-01-04 DIAGNOSIS — M549 Dorsalgia, unspecified: Secondary | ICD-10-CM | POA: Diagnosis not present

## 2022-01-04 DIAGNOSIS — M545 Low back pain, unspecified: Secondary | ICD-10-CM | POA: Diagnosis not present

## 2022-01-04 DIAGNOSIS — S0990XA Unspecified injury of head, initial encounter: Secondary | ICD-10-CM | POA: Insufficient documentation

## 2022-01-04 DIAGNOSIS — M47816 Spondylosis without myelopathy or radiculopathy, lumbar region: Secondary | ICD-10-CM | POA: Diagnosis not present

## 2022-01-04 DIAGNOSIS — M5030 Other cervical disc degeneration, unspecified cervical region: Secondary | ICD-10-CM | POA: Diagnosis not present

## 2022-01-04 DIAGNOSIS — M4316 Spondylolisthesis, lumbar region: Secondary | ICD-10-CM | POA: Diagnosis not present

## 2022-01-04 DIAGNOSIS — S3991XA Unspecified injury of abdomen, initial encounter: Secondary | ICD-10-CM | POA: Diagnosis not present

## 2022-01-04 DIAGNOSIS — Z043 Encounter for examination and observation following other accident: Secondary | ICD-10-CM | POA: Diagnosis not present

## 2022-01-04 LAB — COMPREHENSIVE METABOLIC PANEL
ALT: 19 U/L (ref 0–44)
AST: 34 U/L (ref 15–41)
Albumin: 4.8 g/dL (ref 3.5–5.0)
Alkaline Phosphatase: 78 U/L (ref 38–126)
Anion gap: 13 (ref 5–15)
BUN: 19 mg/dL (ref 8–23)
CO2: 24 mmol/L (ref 22–32)
Calcium: 10.1 mg/dL (ref 8.9–10.3)
Chloride: 103 mmol/L (ref 98–111)
Creatinine, Ser: 1.3 mg/dL — ABNORMAL HIGH (ref 0.44–1.00)
GFR, Estimated: 40 mL/min — ABNORMAL LOW (ref >60)
Glucose, Bld: 135 mg/dL — ABNORMAL HIGH (ref 70–99)
Potassium: 3.6 mmol/L (ref 3.5–5.1)
Sodium: 140 mmol/L (ref 135–145)
Total Bilirubin: 1.1 mg/dL (ref 0.3–1.2)
Total Protein: 7.4 g/dL (ref 6.5–8.1)

## 2022-01-04 LAB — URINALYSIS, ROUTINE W REFLEX MICROSCOPIC
Bilirubin Urine: NEGATIVE
Glucose, UA: NEGATIVE mg/dL
Hgb urine dipstick: NEGATIVE
Ketones, ur: 5 mg/dL — AB
Leukocytes,Ua: NEGATIVE
Nitrite: NEGATIVE
Protein, ur: NEGATIVE mg/dL
Specific Gravity, Urine: 1.019 (ref 1.005–1.030)
pH: 7 (ref 5.0–8.0)

## 2022-01-04 LAB — CBC WITH DIFFERENTIAL/PLATELET
Abs Immature Granulocytes: 0.07 10*3/uL (ref 0.00–0.07)
Basophils Absolute: 0 10*3/uL (ref 0.0–0.1)
Basophils Relative: 0 %
Eosinophils Absolute: 0 10*3/uL (ref 0.0–0.5)
Eosinophils Relative: 0 %
HCT: 46.3 % — ABNORMAL HIGH (ref 36.0–46.0)
Hemoglobin: 15 g/dL (ref 12.0–15.0)
Immature Granulocytes: 1 %
Lymphocytes Relative: 8 %
Lymphs Abs: 0.9 10*3/uL (ref 0.7–4.0)
MCH: 29.2 pg (ref 26.0–34.0)
MCHC: 32.4 g/dL (ref 30.0–36.0)
MCV: 90.3 fL (ref 80.0–100.0)
Monocytes Absolute: 1 10*3/uL (ref 0.1–1.0)
Monocytes Relative: 8 %
Neutro Abs: 9.9 10*3/uL — ABNORMAL HIGH (ref 1.7–7.7)
Neutrophils Relative %: 83 %
Platelets: 326 10*3/uL (ref 150–400)
RBC: 5.13 MIL/uL — ABNORMAL HIGH (ref 3.87–5.11)
RDW: 13 % (ref 11.5–15.5)
WBC: 11.9 10*3/uL — ABNORMAL HIGH (ref 4.0–10.5)
nRBC: 0 % (ref 0.0–0.2)

## 2022-01-04 LAB — TROPONIN I (HIGH SENSITIVITY): Troponin I (High Sensitivity): 8 ng/L (ref <18)

## 2022-01-04 MED ORDER — LIDOCAINE 5 % EX PTCH: 1.0000 | MEDICATED_PATCH | Freq: Two times a day (BID) | CUTANEOUS | 0 refills | Status: AC

## 2022-01-04 MED ORDER — LIDOCAINE 5 % EX PTCH
1.0000 | MEDICATED_PATCH | Freq: Once | CUTANEOUS | Status: DC
  Administered 2022-01-04: 1 via TRANSDERMAL
  Filled 2022-01-04: qty 1

## 2022-01-04 MED ORDER — OXYCODONE HCL 5 MG PO TABS
5.0000 mg | ORAL_TABLET | Freq: Once | ORAL | Status: AC
  Administered 2022-01-04: 5 mg via ORAL
  Filled 2022-01-04: qty 1

## 2022-01-04 MED ORDER — ACETAMINOPHEN 325 MG PO TABS
650.0000 mg | ORAL_TABLET | Freq: Once | ORAL | Status: AC
  Administered 2022-01-04: 650 mg via ORAL
  Filled 2022-01-04: qty 2

## 2022-01-04 MED ORDER — KETOROLAC TROMETHAMINE 15 MG/ML IJ SOLN
15.0000 mg | Freq: Once | INTRAMUSCULAR | Status: AC
Start: 2022-01-04 — End: 2022-01-04
  Administered 2022-01-04: 15 mg via INTRAMUSCULAR
  Filled 2022-01-04: qty 1

## 2022-01-04 NOTE — Discharge Instructions (Addendum)
Her work-up was reassuring and there was no evidence of any new fractures or anything else going on today.  She should talk to her doctors about further management for her chronic pain and return to the ER if she develops worsening symptoms or any other concerns  She call call her PCP to discuss Thryoid nodule and if u want to do a ultrasound to evaluate it.

## 2022-01-04 NOTE — ED Triage Notes (Signed)
Pt c/o lower back pain, unknown onset. Pt states "I think I fell, I don't know when". Staff denied falls. Pt ao x self and place.

## 2022-01-04 NOTE — ED Triage Notes (Signed)
Patient arrived by EMS from [place] for chronic back pain. Patient normally takes oxy for pain with no relief. Staff denies any falls.  Staff is concerned for possible UTI.   EMS vitals: 191/71 b/p 95HR 96% RA  Patient arrived with yellow DNR form. Placed in chart

## 2022-01-04 NOTE — ED Provider Notes (Signed)
[facility] Provider Note    Event Date/Time   First MD Initiated Contact with Patient 01/04/22 [phone]     (approximate)   History   Back Pain   HPI  Diana Lester is a 88 y.o. female who comes in from home place for her chronic back pain.  She reports taking oxycodone without relief.  The staff was concerned that she could have had a UTI so brought her in for evaluation  I reviewed the telephone note from 10/16 where it does appear that patient is prescribed oxycodone.  Patient reports that she states that she had a fall last night.  She states that she is has worsening lower back pain.  She does not really remember having the fall but she just states she has no other reason for why her pain would hurt this much.  Denies any urinary symptoms.  I reviewed patient's notes where she was seen in 2/9 for patient has contrast dye allergy and known severe degenerative disc disease with chronic back pain.   Physical Exam   Triage Vital Signs: ED Triage Vitals  Enc Vitals Group     BP 01/04/22 0755 (!) 136/90     Pulse Rate 01/04/22 0755 92     Resp 01/04/22 0755 19     Temp 01/04/22 0755 98.5 F (36.9 C)     Temp Source 01/04/22 0755 Oral     SpO2 01/04/22 0755 95 %     Weight 01/04/22 0756 120 lb (54.4 kg)     Height 01/04/22 0756 '5\' 1"'$  (1.549 m)     Head Circumference --      Peak Flow --      Pain Score 01/04/22 0756 10     Pain Loc --      Pain Edu? --      Excl. in [place]? --     Most recent vital signs: Vitals:   01/04/22 0755  BP: (!) 136/90  Pulse: 92  Resp: 19  Temp: 98.5 F (36.9 C)  SpO2: 95%     General: Awake, no distress.  CV:  Good peripheral perfusion.  Resp:  Normal effort.  Abd:  No distention.  Other:  Patient reports low back pain.  She is able to lift both legs up off the bed.  Sensations intact.  She reports some lower abdominal discomfort.   ED Results / Procedures / Treatments   Labs (all labs ordered are listed,  but only abnormal results are displayed) Labs Reviewed  URINALYSIS, ROUTINE W REFLEX MICROSCOPIC - Abnormal; Notable for the following components:      Result Value   Color, Urine YELLOW (*)    APPearance HAZY (*)    Ketones, ur 5 (*)    All other components within normal limits  CBC WITH DIFFERENTIAL/PLATELET  COMPREHENSIVE METABOLIC PANEL  TROPONIN I (HIGH SENSITIVITY)     EKG  My interpretation of EKG:    RADIOLOGY I have reviewed the CT head personally interpreted  IMPRESSION: 1. No acute intracranial abnormality. 2. Signs of atrophy and chronic microvascular ischemic change similar to prior imaging. 3. No evidence for acute fracture or traumatic subluxation of the cervical spine. 4. Multilevel degenerative changes of the cervical spine unchanged from prior imaging. 5. 2.5 cm LEFT thyroid nodule. Recommend thyroid US (ref: J Am Coll Radiol. 2015 Feb;12(2): 143-50).   IMPRESSION: 1. No evidence of acute traumatic findings seen in the abdomen or pelvis, motion artifact and lack of  IV contrast limits somewhat limits evaluation. 2. Severe sigmoid diverticulosis without evidence of diverticulitis. 3. Moderate hiatal hernia. 4. Aortic Atherosclerosis (ICD10-I70.0).   IMPRESSION: 1. No acute osseous injury of the lumbar spine. 2. Severe dextroscoliosis of the lumbar spine. 3. Diffuse lumbar spine spondylosis as described above. 4. Aortic Atherosclerosis (ICD10-I70.0).   PROCEDURES:  Critical Care performed: No  Procedures   MEDICATIONS ORDERED IN ED: Medications  lidocaine (LIDODERM) 5 % 1 patch (1 patch Transdermal Patch Applied 01/04/22 1034)  oxyCODONE (Oxy IR/ROXICODONE) immediate release tablet 5 mg (5 mg Oral Given 01/04/22 1035)     IMPRESSION / MDM / ASSESSMENT AND PLAN / ED COURSE  I reviewed the triage vital signs and the nursing notes.   Patient's presentation is most consistent with acute presentation with potential threat to life or bodily  function.   Patient comes in with a possible fall though according to facility they did not think she had had a fall but will get CT imaging to evaluate for intracranial hemorrhage, cervical fracture, lumbar fracture as well as a CT abdomen to make sure is no other acute pathology we will do without contrast given her contrast allergy.  I was able to talk to the facility.  They deny any recent falls but they do report report that she is always complaining of pain in her back and that she is had multiple different interventions.  However that today they noted that the pain seem to be worse than normal which is why they brought her in.  She does have severe dementia and forgets her medications that she is on or why she is at places.  They do report that her sister helps make medical decisions for her.  Troponin was negative.  CBC showed slightly elevated white count but no other infectious symptoms and urine without evidence of UTI  CMP showed stable creatinine.  CT imaging was all reassuring for acute findings.  Thyroid nodule which I discussed with the patient's sister.    I did dsicuss with patient's sister and that she does report that this has been chronic pain and that they have tried many different things for including injections but patient's been ambulatory has no numbness or tingling good strength in her legs no evidence of bladder retention on CT scan.  Do not feel that this represents cord compression.  They are going to talk to your primary care doctors at home.  We will give 1 dose of Toradol due to CKD I do not want to put her on any ibuprofen and give her some Tylenol that she is requesting that she will go back to her facility and they will talk to their doctors back there.  Patient's been up and ambulatory w   FINAL CLINICAL IMPRESSION(S) / ED DIAGNOSES   Final diagnoses:  Chronic bilateral low back pain without sciatica     Rx / DC Orders   ED Discharge Orders           Ordered    lidocaine (LIDODERM) 5 %  Every 12 hours        01/04/22 1255             Note:  This document was prepared using Dragon voice recognition software and may include unintentional dictation errors.   [name], MD 01/04/22 1255

## 2022-01-07 DIAGNOSIS — M545 Low back pain, unspecified: Secondary | ICD-10-CM | POA: Diagnosis not present

## 2022-01-07 DIAGNOSIS — E041 Nontoxic single thyroid nodule: Secondary | ICD-10-CM | POA: Diagnosis not present

## 2022-01-07 DIAGNOSIS — R4182 Altered mental status, unspecified: Secondary | ICD-10-CM | POA: Diagnosis not present

## 2022-01-14 DIAGNOSIS — E039 Hypothyroidism, unspecified: Secondary | ICD-10-CM | POA: Diagnosis not present

## 2022-01-14 DIAGNOSIS — Z79899 Other long term (current) drug therapy: Secondary | ICD-10-CM | POA: Diagnosis not present

## 2022-01-18 DIAGNOSIS — E041 Nontoxic single thyroid nodule: Secondary | ICD-10-CM | POA: Diagnosis not present

## 2022-01-18 DIAGNOSIS — I739 Peripheral vascular disease, unspecified: Secondary | ICD-10-CM | POA: Diagnosis not present

## 2022-01-18 DIAGNOSIS — F339 Major depressive disorder, recurrent, unspecified: Secondary | ICD-10-CM | POA: Diagnosis not present

## 2022-01-18 DIAGNOSIS — F0393 Unspecified dementia, unspecified severity, with mood disturbance: Secondary | ICD-10-CM | POA: Diagnosis not present

## 2022-01-18 DIAGNOSIS — D509 Iron deficiency anemia, unspecified: Secondary | ICD-10-CM | POA: Diagnosis not present

## 2022-01-18 DIAGNOSIS — G894 Chronic pain syndrome: Secondary | ICD-10-CM | POA: Diagnosis not present

## 2022-01-18 DIAGNOSIS — M48061 Spinal stenosis, lumbar region without neurogenic claudication: Secondary | ICD-10-CM | POA: Diagnosis not present

## 2022-01-18 DIAGNOSIS — N184 Chronic kidney disease, stage 4 (severe): Secondary | ICD-10-CM | POA: Diagnosis not present

## 2022-01-18 DIAGNOSIS — E782 Mixed hyperlipidemia: Secondary | ICD-10-CM | POA: Diagnosis not present

## 2022-01-18 DIAGNOSIS — Z9181 History of falling: Secondary | ICD-10-CM | POA: Diagnosis not present

## 2022-01-18 DIAGNOSIS — E559 Vitamin D deficiency, unspecified: Secondary | ICD-10-CM | POA: Diagnosis not present

## 2022-01-18 DIAGNOSIS — Z87891 Personal history of nicotine dependence: Secondary | ICD-10-CM | POA: Diagnosis not present

## 2022-01-18 DIAGNOSIS — F0394 Unspecified dementia, unspecified severity, with anxiety: Secondary | ICD-10-CM | POA: Diagnosis not present

## 2022-01-18 DIAGNOSIS — F03911 Unspecified dementia, unspecified severity, with agitation: Secondary | ICD-10-CM | POA: Diagnosis not present

## 2022-01-18 DIAGNOSIS — Z853 Personal history of malignant neoplasm of breast: Secondary | ICD-10-CM | POA: Diagnosis not present

## 2022-01-18 DIAGNOSIS — I129 Hypertensive chronic kidney disease with stage 1 through stage 4 chronic kidney disease, or unspecified chronic kidney disease: Secondary | ICD-10-CM | POA: Diagnosis not present

## 2022-01-19 DIAGNOSIS — R2681 Unsteadiness on feet: Secondary | ICD-10-CM | POA: Diagnosis not present

## 2022-01-19 DIAGNOSIS — M79674 Pain in right toe(s): Secondary | ICD-10-CM | POA: Diagnosis not present

## 2022-01-19 DIAGNOSIS — L84 Corns and callosities: Secondary | ICD-10-CM | POA: Diagnosis not present

## 2022-01-19 DIAGNOSIS — M79675 Pain in left toe(s): Secondary | ICD-10-CM | POA: Diagnosis not present

## 2022-01-19 DIAGNOSIS — I739 Peripheral vascular disease, unspecified: Secondary | ICD-10-CM | POA: Diagnosis not present

## 2022-01-19 DIAGNOSIS — R300 Dysuria: Secondary | ICD-10-CM | POA: Diagnosis not present

## 2022-01-19 DIAGNOSIS — L603 Nail dystrophy: Secondary | ICD-10-CM | POA: Diagnosis not present

## 2022-01-19 DIAGNOSIS — B351 Tinea unguium: Secondary | ICD-10-CM | POA: Diagnosis not present

## 2022-01-27 DIAGNOSIS — D649 Anemia, unspecified: Secondary | ICD-10-CM | POA: Diagnosis not present

## 2022-01-27 DIAGNOSIS — E041 Nontoxic single thyroid nodule: Secondary | ICD-10-CM | POA: Diagnosis not present

## 2022-01-27 DIAGNOSIS — F02B4 Dementia in other diseases classified elsewhere, moderate, with anxiety: Secondary | ICD-10-CM | POA: Diagnosis not present

## 2022-01-27 DIAGNOSIS — G301 Alzheimer's disease with late onset: Secondary | ICD-10-CM | POA: Diagnosis not present

## 2022-01-27 DIAGNOSIS — M549 Dorsalgia, unspecified: Secondary | ICD-10-CM | POA: Diagnosis not present

## 2022-01-27 DIAGNOSIS — N94819 Vulvodynia, unspecified: Secondary | ICD-10-CM | POA: Diagnosis not present

## 2022-02-01 DIAGNOSIS — F339 Major depressive disorder, recurrent, unspecified: Secondary | ICD-10-CM | POA: Diagnosis not present

## 2022-02-01 DIAGNOSIS — M5416 Radiculopathy, lumbar region: Secondary | ICD-10-CM | POA: Diagnosis not present

## 2022-02-01 DIAGNOSIS — G894 Chronic pain syndrome: Secondary | ICD-10-CM | POA: Diagnosis not present

## 2022-02-01 DIAGNOSIS — M48062 Spinal stenosis, lumbar region with neurogenic claudication: Secondary | ICD-10-CM | POA: Diagnosis not present

## 2022-02-01 DIAGNOSIS — Z87891 Personal history of nicotine dependence: Secondary | ICD-10-CM | POA: Diagnosis not present

## 2022-02-01 DIAGNOSIS — M48061 Spinal stenosis, lumbar region without neurogenic claudication: Secondary | ICD-10-CM | POA: Diagnosis not present

## 2022-02-01 DIAGNOSIS — E041 Nontoxic single thyroid nodule: Secondary | ICD-10-CM | POA: Diagnosis not present

## 2022-02-01 DIAGNOSIS — F0394 Unspecified dementia, unspecified severity, with anxiety: Secondary | ICD-10-CM | POA: Diagnosis not present

## 2022-02-01 DIAGNOSIS — N184 Chronic kidney disease, stage 4 (severe): Secondary | ICD-10-CM | POA: Diagnosis not present

## 2022-02-01 DIAGNOSIS — F03911 Unspecified dementia, unspecified severity, with agitation: Secondary | ICD-10-CM | POA: Diagnosis not present

## 2022-02-01 DIAGNOSIS — M62830 Muscle spasm of back: Secondary | ICD-10-CM | POA: Diagnosis not present

## 2022-02-01 DIAGNOSIS — I129 Hypertensive chronic kidney disease with stage 1 through stage 4 chronic kidney disease, or unspecified chronic kidney disease: Secondary | ICD-10-CM | POA: Diagnosis not present

## 2022-02-01 DIAGNOSIS — E559 Vitamin D deficiency, unspecified: Secondary | ICD-10-CM | POA: Diagnosis not present

## 2022-02-01 DIAGNOSIS — D509 Iron deficiency anemia, unspecified: Secondary | ICD-10-CM | POA: Diagnosis not present

## 2022-02-01 DIAGNOSIS — E782 Mixed hyperlipidemia: Secondary | ICD-10-CM | POA: Diagnosis not present

## 2022-02-01 DIAGNOSIS — F0393 Unspecified dementia, unspecified severity, with mood disturbance: Secondary | ICD-10-CM | POA: Diagnosis not present

## 2022-02-01 DIAGNOSIS — Z9181 History of falling: Secondary | ICD-10-CM | POA: Diagnosis not present

## 2022-02-01 DIAGNOSIS — Z853 Personal history of malignant neoplasm of breast: Secondary | ICD-10-CM | POA: Diagnosis not present

## 2022-02-01 DIAGNOSIS — I739 Peripheral vascular disease, unspecified: Secondary | ICD-10-CM | POA: Diagnosis not present

## 2022-02-02 DIAGNOSIS — Z853 Personal history of malignant neoplasm of breast: Secondary | ICD-10-CM | POA: Diagnosis not present

## 2022-02-02 DIAGNOSIS — D509 Iron deficiency anemia, unspecified: Secondary | ICD-10-CM | POA: Diagnosis not present

## 2022-02-02 DIAGNOSIS — M48061 Spinal stenosis, lumbar region without neurogenic claudication: Secondary | ICD-10-CM | POA: Diagnosis not present

## 2022-02-02 DIAGNOSIS — E559 Vitamin D deficiency, unspecified: Secondary | ICD-10-CM | POA: Diagnosis not present

## 2022-02-02 DIAGNOSIS — I129 Hypertensive chronic kidney disease with stage 1 through stage 4 chronic kidney disease, or unspecified chronic kidney disease: Secondary | ICD-10-CM | POA: Diagnosis not present

## 2022-02-02 DIAGNOSIS — E041 Nontoxic single thyroid nodule: Secondary | ICD-10-CM | POA: Diagnosis not present

## 2022-02-02 DIAGNOSIS — E782 Mixed hyperlipidemia: Secondary | ICD-10-CM | POA: Diagnosis not present

## 2022-02-02 DIAGNOSIS — I739 Peripheral vascular disease, unspecified: Secondary | ICD-10-CM | POA: Diagnosis not present

## 2022-02-02 DIAGNOSIS — Z9181 History of falling: Secondary | ICD-10-CM | POA: Diagnosis not present

## 2022-02-02 DIAGNOSIS — F03911 Unspecified dementia, unspecified severity, with agitation: Secondary | ICD-10-CM | POA: Diagnosis not present

## 2022-02-02 DIAGNOSIS — F0394 Unspecified dementia, unspecified severity, with anxiety: Secondary | ICD-10-CM | POA: Diagnosis not present

## 2022-02-02 DIAGNOSIS — G894 Chronic pain syndrome: Secondary | ICD-10-CM | POA: Diagnosis not present

## 2022-02-02 DIAGNOSIS — Z87891 Personal history of nicotine dependence: Secondary | ICD-10-CM | POA: Diagnosis not present

## 2022-02-02 DIAGNOSIS — F339 Major depressive disorder, recurrent, unspecified: Secondary | ICD-10-CM | POA: Diagnosis not present

## 2022-02-02 DIAGNOSIS — F0393 Unspecified dementia, unspecified severity, with mood disturbance: Secondary | ICD-10-CM | POA: Diagnosis not present

## 2022-02-02 DIAGNOSIS — N184 Chronic kidney disease, stage 4 (severe): Secondary | ICD-10-CM | POA: Diagnosis not present

## 2022-02-03 DIAGNOSIS — F0394 Unspecified dementia, unspecified severity, with anxiety: Secondary | ICD-10-CM | POA: Diagnosis not present

## 2022-02-03 DIAGNOSIS — F331 Major depressive disorder, recurrent, moderate: Secondary | ICD-10-CM | POA: Diagnosis not present

## 2022-02-03 DIAGNOSIS — G301 Alzheimer's disease with late onset: Secondary | ICD-10-CM | POA: Diagnosis not present

## 2022-02-03 DIAGNOSIS — F411 Generalized anxiety disorder: Secondary | ICD-10-CM | POA: Diagnosis not present

## 2022-02-03 DIAGNOSIS — G4700 Insomnia, unspecified: Secondary | ICD-10-CM | POA: Diagnosis not present

## 2022-02-03 DIAGNOSIS — F0393 Unspecified dementia, unspecified severity, with mood disturbance: Secondary | ICD-10-CM | POA: Diagnosis not present

## 2022-02-03 DIAGNOSIS — F02B4 Dementia in other diseases classified elsewhere, moderate, with anxiety: Secondary | ICD-10-CM | POA: Diagnosis not present

## 2022-02-03 DIAGNOSIS — M48061 Spinal stenosis, lumbar region without neurogenic claudication: Secondary | ICD-10-CM | POA: Diagnosis not present

## 2022-02-04 DIAGNOSIS — I10 Essential (primary) hypertension: Secondary | ICD-10-CM | POA: Diagnosis not present

## 2022-02-04 DIAGNOSIS — F419 Anxiety disorder, unspecified: Secondary | ICD-10-CM | POA: Diagnosis not present

## 2022-02-10 DIAGNOSIS — Z853 Personal history of malignant neoplasm of breast: Secondary | ICD-10-CM | POA: Diagnosis not present

## 2022-02-10 DIAGNOSIS — Z87891 Personal history of nicotine dependence: Secondary | ICD-10-CM | POA: Diagnosis not present

## 2022-02-10 DIAGNOSIS — N184 Chronic kidney disease, stage 4 (severe): Secondary | ICD-10-CM | POA: Diagnosis not present

## 2022-02-10 DIAGNOSIS — I129 Hypertensive chronic kidney disease with stage 1 through stage 4 chronic kidney disease, or unspecified chronic kidney disease: Secondary | ICD-10-CM | POA: Diagnosis not present

## 2022-02-10 DIAGNOSIS — Z9181 History of falling: Secondary | ICD-10-CM | POA: Diagnosis not present

## 2022-02-10 DIAGNOSIS — F0393 Unspecified dementia, unspecified severity, with mood disturbance: Secondary | ICD-10-CM | POA: Diagnosis not present

## 2022-02-10 DIAGNOSIS — E041 Nontoxic single thyroid nodule: Secondary | ICD-10-CM | POA: Diagnosis not present

## 2022-02-10 DIAGNOSIS — E782 Mixed hyperlipidemia: Secondary | ICD-10-CM | POA: Diagnosis not present

## 2022-02-10 DIAGNOSIS — E559 Vitamin D deficiency, unspecified: Secondary | ICD-10-CM | POA: Diagnosis not present

## 2022-02-10 DIAGNOSIS — G894 Chronic pain syndrome: Secondary | ICD-10-CM | POA: Diagnosis not present

## 2022-02-10 DIAGNOSIS — F339 Major depressive disorder, recurrent, unspecified: Secondary | ICD-10-CM | POA: Diagnosis not present

## 2022-02-10 DIAGNOSIS — I739 Peripheral vascular disease, unspecified: Secondary | ICD-10-CM | POA: Diagnosis not present

## 2022-02-10 DIAGNOSIS — M48061 Spinal stenosis, lumbar region without neurogenic claudication: Secondary | ICD-10-CM | POA: Diagnosis not present

## 2022-02-10 DIAGNOSIS — D509 Iron deficiency anemia, unspecified: Secondary | ICD-10-CM | POA: Diagnosis not present

## 2022-02-10 DIAGNOSIS — F0394 Unspecified dementia, unspecified severity, with anxiety: Secondary | ICD-10-CM | POA: Diagnosis not present

## 2022-02-10 DIAGNOSIS — F03911 Unspecified dementia, unspecified severity, with agitation: Secondary | ICD-10-CM | POA: Diagnosis not present

## 2022-02-15 DIAGNOSIS — D509 Iron deficiency anemia, unspecified: Secondary | ICD-10-CM | POA: Diagnosis not present

## 2022-02-15 DIAGNOSIS — I129 Hypertensive chronic kidney disease with stage 1 through stage 4 chronic kidney disease, or unspecified chronic kidney disease: Secondary | ICD-10-CM | POA: Diagnosis not present

## 2022-02-15 DIAGNOSIS — I739 Peripheral vascular disease, unspecified: Secondary | ICD-10-CM | POA: Diagnosis not present

## 2022-02-15 DIAGNOSIS — Z87891 Personal history of nicotine dependence: Secondary | ICD-10-CM | POA: Diagnosis not present

## 2022-02-15 DIAGNOSIS — M48061 Spinal stenosis, lumbar region without neurogenic claudication: Secondary | ICD-10-CM | POA: Diagnosis not present

## 2022-02-15 DIAGNOSIS — G894 Chronic pain syndrome: Secondary | ICD-10-CM | POA: Diagnosis not present

## 2022-02-15 DIAGNOSIS — Z853 Personal history of malignant neoplasm of breast: Secondary | ICD-10-CM | POA: Diagnosis not present

## 2022-02-15 DIAGNOSIS — E041 Nontoxic single thyroid nodule: Secondary | ICD-10-CM | POA: Diagnosis not present

## 2022-02-15 DIAGNOSIS — F0393 Unspecified dementia, unspecified severity, with mood disturbance: Secondary | ICD-10-CM | POA: Diagnosis not present

## 2022-02-15 DIAGNOSIS — F03911 Unspecified dementia, unspecified severity, with agitation: Secondary | ICD-10-CM | POA: Diagnosis not present

## 2022-02-15 DIAGNOSIS — F339 Major depressive disorder, recurrent, unspecified: Secondary | ICD-10-CM | POA: Diagnosis not present

## 2022-02-15 DIAGNOSIS — E559 Vitamin D deficiency, unspecified: Secondary | ICD-10-CM | POA: Diagnosis not present

## 2022-02-15 DIAGNOSIS — N184 Chronic kidney disease, stage 4 (severe): Secondary | ICD-10-CM | POA: Diagnosis not present

## 2022-02-15 DIAGNOSIS — Z9181 History of falling: Secondary | ICD-10-CM | POA: Diagnosis not present

## 2022-02-15 DIAGNOSIS — E782 Mixed hyperlipidemia: Secondary | ICD-10-CM | POA: Diagnosis not present

## 2022-02-15 DIAGNOSIS — F0394 Unspecified dementia, unspecified severity, with anxiety: Secondary | ICD-10-CM | POA: Diagnosis not present

## 2022-02-16 DIAGNOSIS — F339 Major depressive disorder, recurrent, unspecified: Secondary | ICD-10-CM | POA: Diagnosis not present

## 2022-02-16 DIAGNOSIS — E041 Nontoxic single thyroid nodule: Secondary | ICD-10-CM | POA: Diagnosis not present

## 2022-02-16 DIAGNOSIS — E559 Vitamin D deficiency, unspecified: Secondary | ICD-10-CM | POA: Diagnosis not present

## 2022-02-16 DIAGNOSIS — D509 Iron deficiency anemia, unspecified: Secondary | ICD-10-CM | POA: Diagnosis not present

## 2022-02-16 DIAGNOSIS — G894 Chronic pain syndrome: Secondary | ICD-10-CM | POA: Diagnosis not present

## 2022-02-16 DIAGNOSIS — Z87891 Personal history of nicotine dependence: Secondary | ICD-10-CM | POA: Diagnosis not present

## 2022-02-16 DIAGNOSIS — N184 Chronic kidney disease, stage 4 (severe): Secondary | ICD-10-CM | POA: Diagnosis not present

## 2022-02-16 DIAGNOSIS — F03911 Unspecified dementia, unspecified severity, with agitation: Secondary | ICD-10-CM | POA: Diagnosis not present

## 2022-02-16 DIAGNOSIS — I129 Hypertensive chronic kidney disease with stage 1 through stage 4 chronic kidney disease, or unspecified chronic kidney disease: Secondary | ICD-10-CM | POA: Diagnosis not present

## 2022-02-16 DIAGNOSIS — I739 Peripheral vascular disease, unspecified: Secondary | ICD-10-CM | POA: Diagnosis not present

## 2022-02-16 DIAGNOSIS — Z853 Personal history of malignant neoplasm of breast: Secondary | ICD-10-CM | POA: Diagnosis not present

## 2022-02-16 DIAGNOSIS — M48061 Spinal stenosis, lumbar region without neurogenic claudication: Secondary | ICD-10-CM | POA: Diagnosis not present

## 2022-02-16 DIAGNOSIS — Z9181 History of falling: Secondary | ICD-10-CM | POA: Diagnosis not present

## 2022-02-16 DIAGNOSIS — E782 Mixed hyperlipidemia: Secondary | ICD-10-CM | POA: Diagnosis not present

## 2022-02-16 DIAGNOSIS — F0394 Unspecified dementia, unspecified severity, with anxiety: Secondary | ICD-10-CM | POA: Diagnosis not present

## 2022-02-16 DIAGNOSIS — F0393 Unspecified dementia, unspecified severity, with mood disturbance: Secondary | ICD-10-CM | POA: Diagnosis not present

## 2022-02-19 DIAGNOSIS — N1832 Chronic kidney disease, stage 3b: Secondary | ICD-10-CM | POA: Diagnosis not present

## 2022-02-19 DIAGNOSIS — I129 Hypertensive chronic kidney disease with stage 1 through stage 4 chronic kidney disease, or unspecified chronic kidney disease: Secondary | ICD-10-CM | POA: Diagnosis not present

## 2022-02-19 DIAGNOSIS — E782 Mixed hyperlipidemia: Secondary | ICD-10-CM | POA: Diagnosis not present

## 2022-02-19 DIAGNOSIS — I739 Peripheral vascular disease, unspecified: Secondary | ICD-10-CM | POA: Diagnosis not present

## 2022-02-19 DIAGNOSIS — I10 Essential (primary) hypertension: Secondary | ICD-10-CM | POA: Diagnosis not present

## 2022-02-19 DIAGNOSIS — M545 Low back pain, unspecified: Secondary | ICD-10-CM | POA: Diagnosis not present

## 2022-02-24 DIAGNOSIS — M48061 Spinal stenosis, lumbar region without neurogenic claudication: Secondary | ICD-10-CM | POA: Diagnosis not present

## 2022-02-24 DIAGNOSIS — F03911 Unspecified dementia, unspecified severity, with agitation: Secondary | ICD-10-CM | POA: Diagnosis not present

## 2022-02-24 DIAGNOSIS — G894 Chronic pain syndrome: Secondary | ICD-10-CM | POA: Diagnosis not present

## 2022-02-24 DIAGNOSIS — D509 Iron deficiency anemia, unspecified: Secondary | ICD-10-CM | POA: Diagnosis not present

## 2022-02-24 DIAGNOSIS — N184 Chronic kidney disease, stage 4 (severe): Secondary | ICD-10-CM | POA: Diagnosis not present

## 2022-02-24 DIAGNOSIS — Z9181 History of falling: Secondary | ICD-10-CM | POA: Diagnosis not present

## 2022-02-24 DIAGNOSIS — Z853 Personal history of malignant neoplasm of breast: Secondary | ICD-10-CM | POA: Diagnosis not present

## 2022-02-24 DIAGNOSIS — E559 Vitamin D deficiency, unspecified: Secondary | ICD-10-CM | POA: Diagnosis not present

## 2022-02-24 DIAGNOSIS — F339 Major depressive disorder, recurrent, unspecified: Secondary | ICD-10-CM | POA: Diagnosis not present

## 2022-02-24 DIAGNOSIS — I739 Peripheral vascular disease, unspecified: Secondary | ICD-10-CM | POA: Diagnosis not present

## 2022-02-24 DIAGNOSIS — I129 Hypertensive chronic kidney disease with stage 1 through stage 4 chronic kidney disease, or unspecified chronic kidney disease: Secondary | ICD-10-CM | POA: Diagnosis not present

## 2022-02-24 DIAGNOSIS — E041 Nontoxic single thyroid nodule: Secondary | ICD-10-CM | POA: Diagnosis not present

## 2022-02-24 DIAGNOSIS — E782 Mixed hyperlipidemia: Secondary | ICD-10-CM | POA: Diagnosis not present

## 2022-02-24 DIAGNOSIS — F0393 Unspecified dementia, unspecified severity, with mood disturbance: Secondary | ICD-10-CM | POA: Diagnosis not present

## 2022-02-24 DIAGNOSIS — Z87891 Personal history of nicotine dependence: Secondary | ICD-10-CM | POA: Diagnosis not present

## 2022-02-24 DIAGNOSIS — F0394 Unspecified dementia, unspecified severity, with anxiety: Secondary | ICD-10-CM | POA: Diagnosis not present

## 2022-02-25 DIAGNOSIS — E782 Mixed hyperlipidemia: Secondary | ICD-10-CM | POA: Diagnosis not present

## 2022-02-25 DIAGNOSIS — I129 Hypertensive chronic kidney disease with stage 1 through stage 4 chronic kidney disease, or unspecified chronic kidney disease: Secondary | ICD-10-CM | POA: Diagnosis not present

## 2022-02-25 DIAGNOSIS — F331 Major depressive disorder, recurrent, moderate: Secondary | ICD-10-CM | POA: Diagnosis not present

## 2022-02-25 DIAGNOSIS — F411 Generalized anxiety disorder: Secondary | ICD-10-CM | POA: Diagnosis not present

## 2022-02-25 DIAGNOSIS — S51811A Laceration without foreign body of right forearm, initial encounter: Secondary | ICD-10-CM | POA: Diagnosis not present

## 2022-02-25 DIAGNOSIS — N1832 Chronic kidney disease, stage 3b: Secondary | ICD-10-CM | POA: Diagnosis not present

## 2022-02-25 DIAGNOSIS — M545 Low back pain, unspecified: Secondary | ICD-10-CM | POA: Diagnosis not present

## 2022-02-25 DIAGNOSIS — I739 Peripheral vascular disease, unspecified: Secondary | ICD-10-CM | POA: Diagnosis not present

## 2022-03-03 DIAGNOSIS — Z87891 Personal history of nicotine dependence: Secondary | ICD-10-CM | POA: Diagnosis not present

## 2022-03-03 DIAGNOSIS — D509 Iron deficiency anemia, unspecified: Secondary | ICD-10-CM | POA: Diagnosis not present

## 2022-03-03 DIAGNOSIS — F02B4 Dementia in other diseases classified elsewhere, moderate, with anxiety: Secondary | ICD-10-CM | POA: Diagnosis not present

## 2022-03-03 DIAGNOSIS — Z9181 History of falling: Secondary | ICD-10-CM | POA: Diagnosis not present

## 2022-03-03 DIAGNOSIS — N184 Chronic kidney disease, stage 4 (severe): Secondary | ICD-10-CM | POA: Diagnosis not present

## 2022-03-03 DIAGNOSIS — G4700 Insomnia, unspecified: Secondary | ICD-10-CM | POA: Diagnosis not present

## 2022-03-03 DIAGNOSIS — F0394 Unspecified dementia, unspecified severity, with anxiety: Secondary | ICD-10-CM | POA: Diagnosis not present

## 2022-03-03 DIAGNOSIS — E559 Vitamin D deficiency, unspecified: Secondary | ICD-10-CM | POA: Diagnosis not present

## 2022-03-03 DIAGNOSIS — G301 Alzheimer's disease with late onset: Secondary | ICD-10-CM | POA: Diagnosis not present

## 2022-03-03 DIAGNOSIS — F0393 Unspecified dementia, unspecified severity, with mood disturbance: Secondary | ICD-10-CM | POA: Diagnosis not present

## 2022-03-03 DIAGNOSIS — E782 Mixed hyperlipidemia: Secondary | ICD-10-CM | POA: Diagnosis not present

## 2022-03-03 DIAGNOSIS — F331 Major depressive disorder, recurrent, moderate: Secondary | ICD-10-CM | POA: Diagnosis not present

## 2022-03-03 DIAGNOSIS — F03911 Unspecified dementia, unspecified severity, with agitation: Secondary | ICD-10-CM | POA: Diagnosis not present

## 2022-03-03 DIAGNOSIS — E041 Nontoxic single thyroid nodule: Secondary | ICD-10-CM | POA: Diagnosis not present

## 2022-03-03 DIAGNOSIS — M48061 Spinal stenosis, lumbar region without neurogenic claudication: Secondary | ICD-10-CM | POA: Diagnosis not present

## 2022-03-03 DIAGNOSIS — G894 Chronic pain syndrome: Secondary | ICD-10-CM | POA: Diagnosis not present

## 2022-03-03 DIAGNOSIS — I129 Hypertensive chronic kidney disease with stage 1 through stage 4 chronic kidney disease, or unspecified chronic kidney disease: Secondary | ICD-10-CM | POA: Diagnosis not present

## 2022-03-03 DIAGNOSIS — F339 Major depressive disorder, recurrent, unspecified: Secondary | ICD-10-CM | POA: Diagnosis not present

## 2022-03-03 DIAGNOSIS — I739 Peripheral vascular disease, unspecified: Secondary | ICD-10-CM | POA: Diagnosis not present

## 2022-03-03 DIAGNOSIS — Z853 Personal history of malignant neoplasm of breast: Secondary | ICD-10-CM | POA: Diagnosis not present

## 2022-03-11 DIAGNOSIS — N1832 Chronic kidney disease, stage 3b: Secondary | ICD-10-CM | POA: Diagnosis not present

## 2022-03-11 DIAGNOSIS — F0394 Unspecified dementia, unspecified severity, with anxiety: Secondary | ICD-10-CM | POA: Diagnosis not present

## 2022-03-11 DIAGNOSIS — S50911D Unspecified superficial injury of right forearm, subsequent encounter: Secondary | ICD-10-CM | POA: Diagnosis not present

## 2022-03-11 DIAGNOSIS — I739 Peripheral vascular disease, unspecified: Secondary | ICD-10-CM | POA: Diagnosis not present

## 2022-03-11 DIAGNOSIS — E041 Nontoxic single thyroid nodule: Secondary | ICD-10-CM | POA: Diagnosis not present

## 2022-03-11 DIAGNOSIS — Z9181 History of falling: Secondary | ICD-10-CM | POA: Diagnosis not present

## 2022-03-11 DIAGNOSIS — Z87891 Personal history of nicotine dependence: Secondary | ICD-10-CM | POA: Diagnosis not present

## 2022-03-11 DIAGNOSIS — M545 Low back pain, unspecified: Secondary | ICD-10-CM | POA: Diagnosis not present

## 2022-03-11 DIAGNOSIS — I129 Hypertensive chronic kidney disease with stage 1 through stage 4 chronic kidney disease, or unspecified chronic kidney disease: Secondary | ICD-10-CM | POA: Diagnosis not present

## 2022-03-11 DIAGNOSIS — Z853 Personal history of malignant neoplasm of breast: Secondary | ICD-10-CM | POA: Diagnosis not present

## 2022-03-11 DIAGNOSIS — F0393 Unspecified dementia, unspecified severity, with mood disturbance: Secondary | ICD-10-CM | POA: Diagnosis not present

## 2022-03-11 DIAGNOSIS — G894 Chronic pain syndrome: Secondary | ICD-10-CM | POA: Diagnosis not present

## 2022-03-11 DIAGNOSIS — M48061 Spinal stenosis, lumbar region without neurogenic claudication: Secondary | ICD-10-CM | POA: Diagnosis not present

## 2022-03-11 DIAGNOSIS — F03911 Unspecified dementia, unspecified severity, with agitation: Secondary | ICD-10-CM | POA: Diagnosis not present

## 2022-03-11 DIAGNOSIS — F339 Major depressive disorder, recurrent, unspecified: Secondary | ICD-10-CM | POA: Diagnosis not present

## 2022-03-11 DIAGNOSIS — E782 Mixed hyperlipidemia: Secondary | ICD-10-CM | POA: Diagnosis not present

## 2022-03-11 DIAGNOSIS — N184 Chronic kidney disease, stage 4 (severe): Secondary | ICD-10-CM | POA: Diagnosis not present

## 2022-03-11 DIAGNOSIS — D509 Iron deficiency anemia, unspecified: Secondary | ICD-10-CM | POA: Diagnosis not present

## 2022-03-11 DIAGNOSIS — E559 Vitamin D deficiency, unspecified: Secondary | ICD-10-CM | POA: Diagnosis not present

## 2022-03-15 DIAGNOSIS — F339 Major depressive disorder, recurrent, unspecified: Secondary | ICD-10-CM | POA: Diagnosis not present

## 2022-03-15 DIAGNOSIS — E041 Nontoxic single thyroid nodule: Secondary | ICD-10-CM | POA: Diagnosis not present

## 2022-03-15 DIAGNOSIS — Z9181 History of falling: Secondary | ICD-10-CM | POA: Diagnosis not present

## 2022-03-15 DIAGNOSIS — Z87891 Personal history of nicotine dependence: Secondary | ICD-10-CM | POA: Diagnosis not present

## 2022-03-15 DIAGNOSIS — E559 Vitamin D deficiency, unspecified: Secondary | ICD-10-CM | POA: Diagnosis not present

## 2022-03-15 DIAGNOSIS — I739 Peripheral vascular disease, unspecified: Secondary | ICD-10-CM | POA: Diagnosis not present

## 2022-03-15 DIAGNOSIS — D509 Iron deficiency anemia, unspecified: Secondary | ICD-10-CM | POA: Diagnosis not present

## 2022-03-15 DIAGNOSIS — F0394 Unspecified dementia, unspecified severity, with anxiety: Secondary | ICD-10-CM | POA: Diagnosis not present

## 2022-03-15 DIAGNOSIS — N184 Chronic kidney disease, stage 4 (severe): Secondary | ICD-10-CM | POA: Diagnosis not present

## 2022-03-15 DIAGNOSIS — F0393 Unspecified dementia, unspecified severity, with mood disturbance: Secondary | ICD-10-CM | POA: Diagnosis not present

## 2022-03-15 DIAGNOSIS — E782 Mixed hyperlipidemia: Secondary | ICD-10-CM | POA: Diagnosis not present

## 2022-03-15 DIAGNOSIS — G894 Chronic pain syndrome: Secondary | ICD-10-CM | POA: Diagnosis not present

## 2022-03-15 DIAGNOSIS — Z853 Personal history of malignant neoplasm of breast: Secondary | ICD-10-CM | POA: Diagnosis not present

## 2022-03-15 DIAGNOSIS — F03911 Unspecified dementia, unspecified severity, with agitation: Secondary | ICD-10-CM | POA: Diagnosis not present

## 2022-03-15 DIAGNOSIS — M48061 Spinal stenosis, lumbar region without neurogenic claudication: Secondary | ICD-10-CM | POA: Diagnosis not present

## 2022-03-15 DIAGNOSIS — I129 Hypertensive chronic kidney disease with stage 1 through stage 4 chronic kidney disease, or unspecified chronic kidney disease: Secondary | ICD-10-CM | POA: Diagnosis not present

## 2022-03-21 DIAGNOSIS — F0393 Unspecified dementia, unspecified severity, with mood disturbance: Secondary | ICD-10-CM | POA: Diagnosis not present

## 2022-03-21 DIAGNOSIS — M48061 Spinal stenosis, lumbar region without neurogenic claudication: Secondary | ICD-10-CM | POA: Diagnosis not present

## 2022-03-21 DIAGNOSIS — F0394 Unspecified dementia, unspecified severity, with anxiety: Secondary | ICD-10-CM | POA: Diagnosis not present

## 2022-03-25 DIAGNOSIS — F0394 Unspecified dementia, unspecified severity, with anxiety: Secondary | ICD-10-CM | POA: Diagnosis not present

## 2022-03-25 DIAGNOSIS — D509 Iron deficiency anemia, unspecified: Secondary | ICD-10-CM | POA: Diagnosis not present

## 2022-03-25 DIAGNOSIS — E782 Mixed hyperlipidemia: Secondary | ICD-10-CM | POA: Diagnosis not present

## 2022-03-25 DIAGNOSIS — F03911 Unspecified dementia, unspecified severity, with agitation: Secondary | ICD-10-CM | POA: Diagnosis not present

## 2022-03-25 DIAGNOSIS — E041 Nontoxic single thyroid nodule: Secondary | ICD-10-CM | POA: Diagnosis not present

## 2022-03-25 DIAGNOSIS — F0393 Unspecified dementia, unspecified severity, with mood disturbance: Secondary | ICD-10-CM | POA: Diagnosis not present

## 2022-03-25 DIAGNOSIS — Z87891 Personal history of nicotine dependence: Secondary | ICD-10-CM | POA: Diagnosis not present

## 2022-03-25 DIAGNOSIS — I739 Peripheral vascular disease, unspecified: Secondary | ICD-10-CM | POA: Diagnosis not present

## 2022-03-25 DIAGNOSIS — I129 Hypertensive chronic kidney disease with stage 1 through stage 4 chronic kidney disease, or unspecified chronic kidney disease: Secondary | ICD-10-CM | POA: Diagnosis not present

## 2022-03-25 DIAGNOSIS — Z853 Personal history of malignant neoplasm of breast: Secondary | ICD-10-CM | POA: Diagnosis not present

## 2022-03-25 DIAGNOSIS — N184 Chronic kidney disease, stage 4 (severe): Secondary | ICD-10-CM | POA: Diagnosis not present

## 2022-03-25 DIAGNOSIS — M48061 Spinal stenosis, lumbar region without neurogenic claudication: Secondary | ICD-10-CM | POA: Diagnosis not present

## 2022-03-25 DIAGNOSIS — Z9181 History of falling: Secondary | ICD-10-CM | POA: Diagnosis not present

## 2022-03-25 DIAGNOSIS — F339 Major depressive disorder, recurrent, unspecified: Secondary | ICD-10-CM | POA: Diagnosis not present

## 2022-03-25 DIAGNOSIS — E559 Vitamin D deficiency, unspecified: Secondary | ICD-10-CM | POA: Diagnosis not present

## 2022-03-25 DIAGNOSIS — G894 Chronic pain syndrome: Secondary | ICD-10-CM | POA: Diagnosis not present

## 2022-04-02 DIAGNOSIS — E559 Vitamin D deficiency, unspecified: Secondary | ICD-10-CM | POA: Diagnosis not present

## 2022-04-02 DIAGNOSIS — E041 Nontoxic single thyroid nodule: Secondary | ICD-10-CM | POA: Diagnosis not present

## 2022-04-02 DIAGNOSIS — D509 Iron deficiency anemia, unspecified: Secondary | ICD-10-CM | POA: Diagnosis not present

## 2022-04-02 DIAGNOSIS — Z9181 History of falling: Secondary | ICD-10-CM | POA: Diagnosis not present

## 2022-04-02 DIAGNOSIS — F03911 Unspecified dementia, unspecified severity, with agitation: Secondary | ICD-10-CM | POA: Diagnosis not present

## 2022-04-02 DIAGNOSIS — Z853 Personal history of malignant neoplasm of breast: Secondary | ICD-10-CM | POA: Diagnosis not present

## 2022-04-02 DIAGNOSIS — Z87891 Personal history of nicotine dependence: Secondary | ICD-10-CM | POA: Diagnosis not present

## 2022-04-02 DIAGNOSIS — F339 Major depressive disorder, recurrent, unspecified: Secondary | ICD-10-CM | POA: Diagnosis not present

## 2022-04-02 DIAGNOSIS — F0394 Unspecified dementia, unspecified severity, with anxiety: Secondary | ICD-10-CM | POA: Diagnosis not present

## 2022-04-02 DIAGNOSIS — I739 Peripheral vascular disease, unspecified: Secondary | ICD-10-CM | POA: Diagnosis not present

## 2022-04-02 DIAGNOSIS — I129 Hypertensive chronic kidney disease with stage 1 through stage 4 chronic kidney disease, or unspecified chronic kidney disease: Secondary | ICD-10-CM | POA: Diagnosis not present

## 2022-04-02 DIAGNOSIS — M48061 Spinal stenosis, lumbar region without neurogenic claudication: Secondary | ICD-10-CM | POA: Diagnosis not present

## 2022-04-02 DIAGNOSIS — E782 Mixed hyperlipidemia: Secondary | ICD-10-CM | POA: Diagnosis not present

## 2022-04-02 DIAGNOSIS — F0393 Unspecified dementia, unspecified severity, with mood disturbance: Secondary | ICD-10-CM | POA: Diagnosis not present

## 2022-04-02 DIAGNOSIS — G894 Chronic pain syndrome: Secondary | ICD-10-CM | POA: Diagnosis not present

## 2022-04-02 DIAGNOSIS — N184 Chronic kidney disease, stage 4 (severe): Secondary | ICD-10-CM | POA: Diagnosis not present

## 2022-04-08 DIAGNOSIS — N184 Chronic kidney disease, stage 4 (severe): Secondary | ICD-10-CM | POA: Diagnosis not present

## 2022-04-08 DIAGNOSIS — F0394 Unspecified dementia, unspecified severity, with anxiety: Secondary | ICD-10-CM | POA: Diagnosis not present

## 2022-04-08 DIAGNOSIS — F03911 Unspecified dementia, unspecified severity, with agitation: Secondary | ICD-10-CM | POA: Diagnosis not present

## 2022-04-08 DIAGNOSIS — M48061 Spinal stenosis, lumbar region without neurogenic claudication: Secondary | ICD-10-CM | POA: Diagnosis not present

## 2022-04-08 DIAGNOSIS — E041 Nontoxic single thyroid nodule: Secondary | ICD-10-CM | POA: Diagnosis not present

## 2022-04-08 DIAGNOSIS — E782 Mixed hyperlipidemia: Secondary | ICD-10-CM | POA: Diagnosis not present

## 2022-04-08 DIAGNOSIS — Z9181 History of falling: Secondary | ICD-10-CM | POA: Diagnosis not present

## 2022-04-08 DIAGNOSIS — F339 Major depressive disorder, recurrent, unspecified: Secondary | ICD-10-CM | POA: Diagnosis not present

## 2022-04-08 DIAGNOSIS — Z87891 Personal history of nicotine dependence: Secondary | ICD-10-CM | POA: Diagnosis not present

## 2022-04-08 DIAGNOSIS — Z853 Personal history of malignant neoplasm of breast: Secondary | ICD-10-CM | POA: Diagnosis not present

## 2022-04-08 DIAGNOSIS — E559 Vitamin D deficiency, unspecified: Secondary | ICD-10-CM | POA: Diagnosis not present

## 2022-04-08 DIAGNOSIS — F0393 Unspecified dementia, unspecified severity, with mood disturbance: Secondary | ICD-10-CM | POA: Diagnosis not present

## 2022-04-08 DIAGNOSIS — D509 Iron deficiency anemia, unspecified: Secondary | ICD-10-CM | POA: Diagnosis not present

## 2022-04-08 DIAGNOSIS — G894 Chronic pain syndrome: Secondary | ICD-10-CM | POA: Diagnosis not present

## 2022-04-08 DIAGNOSIS — I739 Peripheral vascular disease, unspecified: Secondary | ICD-10-CM | POA: Diagnosis not present

## 2022-04-08 DIAGNOSIS — I129 Hypertensive chronic kidney disease with stage 1 through stage 4 chronic kidney disease, or unspecified chronic kidney disease: Secondary | ICD-10-CM | POA: Diagnosis not present

## 2022-04-12 DIAGNOSIS — F411 Generalized anxiety disorder: Secondary | ICD-10-CM | POA: Diagnosis not present

## 2022-04-12 DIAGNOSIS — F331 Major depressive disorder, recurrent, moderate: Secondary | ICD-10-CM | POA: Diagnosis not present

## 2022-04-12 DIAGNOSIS — G301 Alzheimer's disease with late onset: Secondary | ICD-10-CM | POA: Diagnosis not present

## 2022-04-13 DIAGNOSIS — R300 Dysuria: Secondary | ICD-10-CM | POA: Diagnosis not present

## 2022-04-22 DIAGNOSIS — I739 Peripheral vascular disease, unspecified: Secondary | ICD-10-CM | POA: Diagnosis not present

## 2022-04-22 DIAGNOSIS — Z0189 Encounter for other specified special examinations: Secondary | ICD-10-CM | POA: Diagnosis not present

## 2022-04-22 DIAGNOSIS — N1832 Chronic kidney disease, stage 3b: Secondary | ICD-10-CM | POA: Diagnosis not present

## 2022-04-22 DIAGNOSIS — M545 Low back pain, unspecified: Secondary | ICD-10-CM | POA: Diagnosis not present

## 2022-04-22 DIAGNOSIS — E782 Mixed hyperlipidemia: Secondary | ICD-10-CM | POA: Diagnosis not present

## 2022-04-22 DIAGNOSIS — I129 Hypertensive chronic kidney disease with stage 1 through stage 4 chronic kidney disease, or unspecified chronic kidney disease: Secondary | ICD-10-CM | POA: Diagnosis not present

## 2022-04-26 DIAGNOSIS — F331 Major depressive disorder, recurrent, moderate: Secondary | ICD-10-CM | POA: Diagnosis not present

## 2022-04-26 DIAGNOSIS — G301 Alzheimer's disease with late onset: Secondary | ICD-10-CM | POA: Diagnosis not present

## 2022-04-29 DIAGNOSIS — G301 Alzheimer's disease with late onset: Secondary | ICD-10-CM | POA: Diagnosis not present

## 2022-04-29 DIAGNOSIS — R634 Abnormal weight loss: Secondary | ICD-10-CM | POA: Diagnosis not present

## 2022-04-29 DIAGNOSIS — F02B4 Dementia in other diseases classified elsewhere, moderate, with anxiety: Secondary | ICD-10-CM | POA: Diagnosis not present

## 2022-04-29 DIAGNOSIS — M545 Low back pain, unspecified: Secondary | ICD-10-CM | POA: Diagnosis not present

## 2022-05-10 DIAGNOSIS — M48061 Spinal stenosis, lumbar region without neurogenic claudication: Secondary | ICD-10-CM | POA: Diagnosis not present

## 2022-05-10 DIAGNOSIS — F331 Major depressive disorder, recurrent, moderate: Secondary | ICD-10-CM | POA: Diagnosis not present

## 2022-05-10 DIAGNOSIS — G301 Alzheimer's disease with late onset: Secondary | ICD-10-CM | POA: Diagnosis not present

## 2022-05-10 DIAGNOSIS — G894 Chronic pain syndrome: Secondary | ICD-10-CM | POA: Diagnosis not present

## 2022-05-11 DIAGNOSIS — D509 Iron deficiency anemia, unspecified: Secondary | ICD-10-CM | POA: Diagnosis not present

## 2022-05-11 DIAGNOSIS — F339 Major depressive disorder, recurrent, unspecified: Secondary | ICD-10-CM | POA: Diagnosis not present

## 2022-05-11 DIAGNOSIS — E782 Mixed hyperlipidemia: Secondary | ICD-10-CM | POA: Diagnosis not present

## 2022-05-11 DIAGNOSIS — Z87891 Personal history of nicotine dependence: Secondary | ICD-10-CM | POA: Diagnosis not present

## 2022-05-11 DIAGNOSIS — E559 Vitamin D deficiency, unspecified: Secondary | ICD-10-CM | POA: Diagnosis not present

## 2022-05-11 DIAGNOSIS — F0394 Unspecified dementia, unspecified severity, with anxiety: Secondary | ICD-10-CM | POA: Diagnosis not present

## 2022-05-11 DIAGNOSIS — Z9181 History of falling: Secondary | ICD-10-CM | POA: Diagnosis not present

## 2022-05-11 DIAGNOSIS — Z853 Personal history of malignant neoplasm of breast: Secondary | ICD-10-CM | POA: Diagnosis not present

## 2022-05-11 DIAGNOSIS — M48061 Spinal stenosis, lumbar region without neurogenic claudication: Secondary | ICD-10-CM | POA: Diagnosis not present

## 2022-05-11 DIAGNOSIS — N184 Chronic kidney disease, stage 4 (severe): Secondary | ICD-10-CM | POA: Diagnosis not present

## 2022-05-11 DIAGNOSIS — I129 Hypertensive chronic kidney disease with stage 1 through stage 4 chronic kidney disease, or unspecified chronic kidney disease: Secondary | ICD-10-CM | POA: Diagnosis not present

## 2022-05-11 DIAGNOSIS — F0393 Unspecified dementia, unspecified severity, with mood disturbance: Secondary | ICD-10-CM | POA: Diagnosis not present

## 2022-05-11 DIAGNOSIS — F03911 Unspecified dementia, unspecified severity, with agitation: Secondary | ICD-10-CM | POA: Diagnosis not present

## 2022-05-11 DIAGNOSIS — I739 Peripheral vascular disease, unspecified: Secondary | ICD-10-CM | POA: Diagnosis not present

## 2022-05-11 DIAGNOSIS — G894 Chronic pain syndrome: Secondary | ICD-10-CM | POA: Diagnosis not present

## 2022-05-11 DIAGNOSIS — E041 Nontoxic single thyroid nodule: Secondary | ICD-10-CM | POA: Diagnosis not present

## 2022-05-17 DIAGNOSIS — R2689 Other abnormalities of gait and mobility: Secondary | ICD-10-CM | POA: Diagnosis not present

## 2022-05-17 DIAGNOSIS — L84 Corns and callosities: Secondary | ICD-10-CM | POA: Diagnosis not present

## 2022-05-17 DIAGNOSIS — B351 Tinea unguium: Secondary | ICD-10-CM | POA: Diagnosis not present

## 2022-05-17 DIAGNOSIS — L989 Disorder of the skin and subcutaneous tissue, unspecified: Secondary | ICD-10-CM | POA: Diagnosis not present

## 2022-05-17 DIAGNOSIS — R238 Other skin changes: Secondary | ICD-10-CM | POA: Diagnosis not present

## 2022-05-17 DIAGNOSIS — R262 Difficulty in walking, not elsewhere classified: Secondary | ICD-10-CM | POA: Diagnosis not present

## 2022-05-17 DIAGNOSIS — L600 Ingrowing nail: Secondary | ICD-10-CM | POA: Diagnosis not present

## 2022-05-17 DIAGNOSIS — M6281 Muscle weakness (generalized): Secondary | ICD-10-CM | POA: Diagnosis not present

## 2022-05-17 DIAGNOSIS — M722 Plantar fascial fibromatosis: Secondary | ICD-10-CM | POA: Diagnosis not present

## 2022-05-20 DIAGNOSIS — I129 Hypertensive chronic kidney disease with stage 1 through stage 4 chronic kidney disease, or unspecified chronic kidney disease: Secondary | ICD-10-CM | POA: Diagnosis not present

## 2022-05-20 DIAGNOSIS — M549 Dorsalgia, unspecified: Secondary | ICD-10-CM | POA: Diagnosis not present

## 2022-05-20 DIAGNOSIS — G8929 Other chronic pain: Secondary | ICD-10-CM | POA: Diagnosis not present

## 2022-05-20 DIAGNOSIS — L853 Xerosis cutis: Secondary | ICD-10-CM | POA: Diagnosis not present

## 2022-05-20 DIAGNOSIS — R634 Abnormal weight loss: Secondary | ICD-10-CM | POA: Diagnosis not present

## 2022-05-20 DIAGNOSIS — E041 Nontoxic single thyroid nodule: Secondary | ICD-10-CM | POA: Diagnosis not present

## 2022-05-20 DIAGNOSIS — N189 Chronic kidney disease, unspecified: Secondary | ICD-10-CM | POA: Diagnosis not present

## 2022-05-25 ENCOUNTER — Encounter: Payer: Self-pay | Admitting: Nurse Practitioner

## 2022-05-25 ENCOUNTER — Non-Acute Institutional Stay: Payer: PPO | Admitting: Nurse Practitioner

## 2022-05-25 DIAGNOSIS — Z515 Encounter for palliative care: Secondary | ICD-10-CM | POA: Diagnosis not present

## 2022-05-25 DIAGNOSIS — R630 Anorexia: Secondary | ICD-10-CM | POA: Diagnosis not present

## 2022-05-25 DIAGNOSIS — R634 Abnormal weight loss: Secondary | ICD-10-CM | POA: Diagnosis not present

## 2022-05-25 DIAGNOSIS — M47816 Spondylosis without myelopathy or radiculopathy, lumbar region: Secondary | ICD-10-CM

## 2022-05-25 DIAGNOSIS — G894 Chronic pain syndrome: Secondary | ICD-10-CM | POA: Diagnosis not present

## 2022-05-25 NOTE — Progress Notes (Signed)
[facility] Consult Note Telephone: [phone]  Fax: [phone]   Date of encounter: 05/25/22 2:07 PM PATIENT NAME: Diana Lester Diana Lester   [phone] (home)  DOB: 04-17-1930 MRN: [id number] PRIMARY CARE PROVIDER:    [facility]  RESPONSIBLE PARTY:    Contact Information     Name Relation Home Work Mobile   [place] ([place]) Sister   [phone]   Hess,Martha Sister [phone]  [phone]   Aabriella, [place] [phone]        I met face to face with patient in facility. Palliative Care was asked to follow this patient by consultation request of  [facility] to address advance care planning and complex medical decision making. This is the initial visit.       ASSESSMENT AND PLAN / RECOMMENDATIONS:  Symptom Management/Plan: 1. Advance Care Planning;  Ongoing discussions  2. Chronic pain secondary to advanced degenerative disc disease at L1-2, L2-3, L3-4; severe central stenosis at L4-5; and advanced facet degenerative changes at L3-4, L4-5, and L5-S1. Discussed pain management at length with Diana Lester, daughter Hinton Dyer and staff;  Following recommendations Discontinue prozac and start cymbalta (with pain component) Short course of steriods Start Meloxicam 15mg  qd Consult recommended for second opinion with Dr Karna Dupes Sports Medicine Physical therapy for pain modality Continue Oxycodone for now with plan to wean off once alternative recommendations initiated  3. Weight loss/anorexia; reviewed weights, diet, encourage Diana Lester to eat, supplements, continue to monitor weights.  06/15/2021 weight 133 lbs 01/01/2022 weight 125 lbs 05/11/2022 weight 123.6 lbs 9.4 lbs/1 year; 7.07% 1.4 lbs/6 months; 1.12%  1 year ago PPS 60% Currently PPS low 50%  MRI which demonstrates advanced degenerative disc  disease at L1-2, L2-3, L3-4; severe central stenosis at L4-5; and advanced facet degenerative changes at L3-4, L4-5, and L5-S1.   4. Goals of Care: Goals include to maximize quality of life and symptom management. Our advance care planning conversation included a discussion about:    The value and importance of advance care planning  Exploration of personal, cultural or spiritual beliefs that might influence medical decisions  Exploration of goals of care in the event of a sudden injury or illness  Identification and preparation of a healthcare agent  Review and updating or creation of an advance directive document. 5. Palliative care encounter; Palliative care encounter; Palliative medicine team will continue to support patient, patient's family, and medical team. Visit consisted of counseling and education dealing with the complex and emotionally intense issues of symptom management and palliative care in the setting of serious and potentially life-threatening illness  Follow up Palliative Care Visit: Palliative care will continue to follow for complex medical decision making, advance care planning, and clarification of goals. Return 2 to 4 weeks or prn.  I spent 61 minutes providing this consultation. More than 50% of the time in this consultation was spent in counseling and care coordination. PPS: 50%  Chief Complaint: Initial palliative consult for complex medical decision making, address goals, manage ongoing symptoms  HISTORY OF PRESENT ILLNESS:  Diana Lester is a 89 y.o. year old female  with multiple medical problems including CKD, HTN, angiomyolipoma of kidney, advanced degenerative disc disease at L1-2, L2-3, L3-4; severe central stenosis at L4-5; and advanced facet degenerative changes at L3-4, L4-5, and L5-S1, memory impairment, h/o breast cancer, HLD, h/o hyponatremia, depression, anxiety. Diana Lester  resides at [facility], is ambulatory without recent falls. Requires assistance to  ADL's, requiring more assistance with increase in back pain. Diana Lester does go to dining area to eat her meals, feeds herself with weight loss. Recently evaluated by hospice though not found to be decompensated enough to meet criteria, weight loss needs to be >10%. Staff endorses purpose of pc initial consult for severe back pain. At present Diana Lester is lying in bed, complains of lower back pain. Diana Lester and I talked about purpose of pc visit, forgetful. We talked about pain at length, last time she was independent, family and social history, past medical history. We talked about residing at facility, functional abilities. We talked about forgetfulness. Support provided. Diana Lester was cooperative with assessment. I called Hinton Dyer, Diana Lester daughter, clinical update discussed, we talked at length about her last films including severe degeneration and cause of Diana Lester's pain. Hinton Dyer endorses Diana Lester did have an epidural without improvement, continues on Oxycodone without relief and is impeding her daily functional abilities. We talked about recommendations above and [name] in agreement. Medications, medical goals, goc and poc reviewed. Will continue to follow with PC. Reviewed with PT for option of PT for pain modality which therapist endorses able to offer that option at facility, reviewed mri results. Updated staff, message left for [facility] to further discuss recommendations.   History obtained from review of EMR, discussion with primary team, and interview with family, facility staff/caregiver and/or Diana. Diana Lester.  I reviewed available labs, medications, imaging, studies and related documents from the EMR.  Records reviewed and summarized above.   Physical Exam: General: frail appearing, thin, fragile female ENMT: oral mucous membranes moist CV: S1S2, RRR Pulmonary: LCTA Abdomen: soft and non tender MSK: ambulatory; +muscle wasting Skin: warm and dry Neuro:  +generalized weakness,  +  cognitive impairment Psych: non-anxious affect, A and O x 3 CURRENT PROBLEM LIST:  Patient Active Problem List   Diagnosis Date Noted   Memory change 12/01/2018   Unsteady gait 12/01/2018   Sepsis ([place]) 12/01/2018   Elevated hemoglobin (HCC) 07/23/2018   Right knee pain 03/11/2018   Urinary frequency 06/25/2017   Lumbar spinal stenosis 01/23/2016   Hypertension 01/23/2016   History of depression 01/23/2016   Angiomyolipoma of kidney 01/23/2016   Right lower quadrant abdominal pain    Anxiety 10/20/2015   Loss of weight 10/20/2015   Hyponatremia 08/07/2014   Health care maintenance 05/05/2014   Skin cancer 01/29/2014   Other malaise and fatigue 10/21/2013   Skin lesion of cheek 10/21/2013   Renal cyst 03/22/2013   Chronic kidney disease, stage III (moderate) ([place]) 03/14/2013   Abdominal bruit 03/14/2013   Spinal stenosis of lumbar region 12/09/2012   Bulging disc 12/09/2012   History of colonic polyps 12/09/2012   History of breast cancer 12/09/2012   Mild depression 12/09/2012   Essential hypertension, benign 12/09/2012   Hyperlipidemia 12/09/2012   PAST MEDICAL HISTORY:  Active Ambulatory Problems    Diagnosis Date Noted   Spinal stenosis of lumbar region 12/09/2012   Bulging disc 12/09/2012   History of colonic polyps 12/09/2012   History of breast cancer 12/09/2012   Mild depression 12/09/2012   Essential hypertension, benign 12/09/2012   Hyperlipidemia 12/09/2012   Chronic kidney disease, stage III (moderate) ([place]) 03/14/2013   Abdominal bruit 03/14/2013   Renal cyst 03/22/2013   Other malaise and fatigue 10/21/2013   Skin lesion of cheek 10/21/2013   Skin cancer 01/29/2014  Health care maintenance 05/05/2014   Hyponatremia 08/07/2014   Anxiety 10/20/2015   Loss of weight 10/20/2015   Right lower quadrant abdominal pain    Lumbar spinal stenosis 01/23/2016   Hypertension 01/23/2016   History of depression 01/23/2016   Angiomyolipoma of kidney 01/23/2016    Urinary frequency 06/25/2017   Right knee pain 03/11/2018   Elevated hemoglobin (HCC) 07/23/2018   Memory change 12/01/2018   Unsteady gait 12/01/2018   Sepsis ([place]) 12/01/2018   Resolved Ambulatory Problems    Diagnosis Date Noted   Burn 07/14/2015   Abdominal pain 01/10/2016   Past Medical History:  Diagnosis Date   Arthritis    Breast cancer ([place]) 1995   Depression    Hx of colonic polyp    Hypercholesterolemia    Melanoma ([place])    Personal history of radiation therapy    Spinal stenosis    Vaginal burning    SOCIAL HX:  Social History   Tobacco Use   Smoking status: Former    Packs/day: 0.50    Years: 4.00    Additional pack years: 0.00    Total pack years: 2.00    Types: Cigarettes    Quit date: 12/09/1984    Years since quitting: 37.4   Smokeless tobacco: Never   Tobacco comments:    quit 12/09/84  Substance Use Topics   Alcohol use: No    Alcohol/week: 0.0 standard drinks of alcohol    Comment: sparingly   FAMILY HX:  Family History  Problem Relation Age of Onset   Arthritis Mother    Hyperlipidemia Mother    Hypertension Mother    Hyperlipidemia Sister    Kidney cancer Sister    Breast cancer Neg Hx       ALLERGIES:  Allergies  Allergen Reactions   Contrast Media [Iodinated Contrast Media] Hives     PERTINENT MEDICATIONS:  Outpatient Encounter Medications as of 05/25/2022  Medication Sig   acetaminophen (TYLENOL) 500 MG tablet Take 500 mg by mouth every 6 (six) hours as needed.   bacitracin ointment Apply to affected area daily x7 days   clotrimazole-betamethasone (LOTRISONE) cream Apply externally BID for 2 wks   lisinopril-hydrochlorothiazide (ZESTORETIC) 20-12.5 MG tablet Take 1 tablet by mouth daily.   lovastatin (MEVACOR) 40 MG tablet TAKE 1 TO 2 TABLETS(40 TO 80 MG) BY MOUTH DAILY   mirtazapine (REMERON) 15 MG tablet Take 15 mg by mouth at bedtime.   Multiple Vitamin (MULTIVITAMIN) TABS TAKE 1 TABLET BY MOUTH ONCE DAILY   traMADol  (ULTRAM) 50 MG tablet Take 50 mg by mouth 2 (two) times daily.   No facility-administered encounter medications on file as of 05/25/2022.   Thank you for the opportunity to participate in the care of Diana. Diana Lester.  The palliative care team will continue to follow. Please call our office at [phone] if we can be of additional assistance.   Jaedyn Lard Z Arta Stump, NP ,

## 2022-06-07 DIAGNOSIS — F02A4 Dementia in other diseases classified elsewhere, mild, with anxiety: Secondary | ICD-10-CM | POA: Diagnosis not present

## 2022-06-07 DIAGNOSIS — G301 Alzheimer's disease with late onset: Secondary | ICD-10-CM | POA: Diagnosis not present

## 2022-06-07 DIAGNOSIS — F411 Generalized anxiety disorder: Secondary | ICD-10-CM | POA: Diagnosis not present

## 2022-06-07 DIAGNOSIS — F331 Major depressive disorder, recurrent, moderate: Secondary | ICD-10-CM | POA: Diagnosis not present

## 2022-06-11 ENCOUNTER — Encounter: Payer: PPO | Admitting: Nurse Practitioner

## 2022-06-11 ENCOUNTER — Non-Acute Institutional Stay: Payer: PPO | Admitting: Nurse Practitioner

## 2022-06-11 VITALS — HR 85 | Resp 18 | Wt 123.6 lb

## 2022-06-11 DIAGNOSIS — G894 Chronic pain syndrome: Secondary | ICD-10-CM

## 2022-06-11 DIAGNOSIS — Z515 Encounter for palliative care: Secondary | ICD-10-CM | POA: Diagnosis not present

## 2022-06-11 DIAGNOSIS — R634 Abnormal weight loss: Secondary | ICD-10-CM

## 2022-06-11 DIAGNOSIS — R630 Anorexia: Secondary | ICD-10-CM | POA: Diagnosis not present

## 2022-06-11 DIAGNOSIS — M47816 Spondylosis without myelopathy or radiculopathy, lumbar region: Secondary | ICD-10-CM

## 2022-06-11 NOTE — Progress Notes (Signed)
[facility] Consult Note Telephone: [phone]  Fax: [phone]    Date of encounter: 06/11/22 3:27 PM PATIENT NAME: Diana Lester [facility] [address] [place] [address]   [phone] (home)  DOB: 11-07-1930 MRN: [id number] PRIMARY CARE PROVIDER:    [facility]  RESPONSIBLE PARTY:    Contact Information     Name Relation Home Work Mobile   Diana CreekFuller,Diana ([place]) Sister   [phone]   Hess,Diana Sister [phone]  [phone]   Diana SenterFuller,Diana Son [phone]       I met face to face with patient in facility. Palliative Care was asked to follow this patient by consultation request of  [facility] to address advance care planning and complex medical decision making. This is the initial visit.       ASSESSMENT AND PLAN / RECOMMENDATIONS:  Symptom Management/Plan: 1. Advance Care Planning;  Ongoing discussions   2. Chronic pain secondary to advanced degenerative disc disease at L1-2, L2-3, L3-4; severe central stenosis at L4-5; and advanced facet degenerative changes at L3-4, L4-5, and L5-S1. Updated pain management at length with Ms Diana Lester, daughter Diana Lester and staff;   3. Weight loss/anorexia; reviewed weights, diet, encourage Ms Diana Lester to eat, supplements, continue to monitor weights.  06/15/2021 weight 133 lbs 01/01/2022 weight 125 lbs 05/11/2022 weight 123.6 lbs 06/07/2022 weight 123.6 lbs   1 year ago PPS 60% Currently PPS low 50%   MRI which demonstrates advanced degenerative disc disease at L1-2, L2-3, L3-4; severe central stenosis at L4-5; and advanced facet degenerative changes at L3-4, L4-5, and L5-S1.    4. Palliative care encounter; Palliative care encounter; Palliative medicine team will continue to support patient, patient's family, and medical team. Visit consisted of counseling and education dealing with the complex and emotionally intense issues of symptom  management and palliative care in the setting of serious and potentially life-threatening illness   Follow up Palliative Care Visit: Palliative care will continue to follow for complex medical decision making, advance care planning, and clarification of goals. Return 2 to 4 weeks or prn.   I spent 43 minutes providing this consultation starting at 10:15 am. More than 50% of the time in this consultation was spent in counseling and care coordination. PPS: 50%   Chief Complaint: Initial palliative consult for complex medical decision making, address goals, manage ongoing symptoms   HISTORY OF PRESENT ILLNESS:  Diana Lester is a 89 y.o. year old female  with multiple medical problems including CKD, HTN, angiomyolipoma of kidney, advanced degenerative disc disease at L1-2, L2-3, L3-4; severe central stenosis at L4-5; and advanced facet degenerative changes at L3-4, L4-5, and L5-S1, memory impairment, h/o breast cancer, HLD, h/o hyponatremia, depression, anxiety. Ms Diana Lester resides at [facility], is ambulatory without recent falls. Requires assistance to ADL's, requiring more assistance with increase in back pain. Ms Diana Lester does go to dining area to eat her meals, feeds herself with weight loss. Pain has been improving with current changes.  Following recommendations from 05/25/2022 PC visit Discontinue prozac and start cymbalta (with pain component) Short course of steriods Start Meloxicam 15mg  qd Consult recommended for second opinion with Dr Ophelia CharterZachary Smith  Sports Medicine Physical therapy for pain modality Continue Oxycodone for now with plan to wean off once alternative recommendations initiated At present Ms Diana Lester is sleeping in the chair in her living area space at [facility]. Ms Diana Lester awoke to verbal cues, endorses overall she is feeling  better, pain better managed with current regimen. We talked about her functional ability to do more, ambulate with severe pain. We talked about  daily routine, ros, appetite, foods she likes, what brings her joy. We talked about purpose of today PC f/u visit further discussion monitor trends of appetite, weights, monitor for functional, cognitive decline with chronic disease progression, assess any active symptoms, supportive role. We talked about f/u visit, I did contact dtg with update on clinical presentation, pc visit, will continue with current poc.    History obtained from review of EMR, discussion with primary team, and interview with family, facility staff/caregiver and/or Ms. Diana Arthurs.  I reviewed available labs, medications, imaging, studies and related documents from the EMR.  Records reviewed and summarized above.    Physical Exam: General: frail appearing, thin, fragile female ENMT: oral mucous membranes moist CV: S1S2, RRR Pulmonary: LCTA MSK: ambulatory; +muscle wasting Skin: warm and dry Neuro:  +generalized weakness Psych: non-anxious affect, A and O x 3 Thank you for the opportunity to participate in the care of Ms. Diana Arthurs. Please call our office at [phone] if we can be of additional assistance.   Joni Colegrove Prince [place], NP

## 2022-06-17 DIAGNOSIS — M545 Low back pain, unspecified: Secondary | ICD-10-CM | POA: Diagnosis not present

## 2022-06-17 DIAGNOSIS — F02A4 Dementia in other diseases classified elsewhere, mild, with anxiety: Secondary | ICD-10-CM | POA: Diagnosis not present

## 2022-06-17 DIAGNOSIS — R399 Unspecified symptoms and signs involving the genitourinary system: Secondary | ICD-10-CM | POA: Diagnosis not present

## 2022-06-17 DIAGNOSIS — R634 Abnormal weight loss: Secondary | ICD-10-CM | POA: Diagnosis not present

## 2022-06-17 DIAGNOSIS — G301 Alzheimer's disease with late onset: Secondary | ICD-10-CM | POA: Diagnosis not present

## 2022-06-21 DIAGNOSIS — F411 Generalized anxiety disorder: Secondary | ICD-10-CM | POA: Diagnosis not present

## 2022-06-21 DIAGNOSIS — F331 Major depressive disorder, recurrent, moderate: Secondary | ICD-10-CM | POA: Diagnosis not present

## 2022-06-21 DIAGNOSIS — G301 Alzheimer's disease with late onset: Secondary | ICD-10-CM | POA: Diagnosis not present

## 2022-07-02 ENCOUNTER — Emergency Department: Payer: PPO

## 2022-07-02 ENCOUNTER — Emergency Department
Admission: EM | Admit: 2022-07-02 | Discharge: 2022-07-02 | Disposition: A | Discharge status: Home / Self Care | Payer: PPO | Attending: Emergency Medicine | Admitting: Emergency Medicine

## 2022-07-02 ENCOUNTER — Other Ambulatory Visit: Payer: Self-pay

## 2022-07-02 DIAGNOSIS — R58 Hemorrhage, not elsewhere classified: Secondary | ICD-10-CM | POA: Diagnosis not present

## 2022-07-02 DIAGNOSIS — S80919A Unspecified superficial injury of unspecified knee, initial encounter: Secondary | ICD-10-CM | POA: Diagnosis not present

## 2022-07-02 DIAGNOSIS — W19XXXA Unspecified fall, initial encounter: Secondary | ICD-10-CM | POA: Diagnosis not present

## 2022-07-02 DIAGNOSIS — Z043 Encounter for examination and observation following other accident: Secondary | ICD-10-CM | POA: Diagnosis not present

## 2022-07-02 DIAGNOSIS — I6782 Cerebral ischemia: Secondary | ICD-10-CM | POA: Diagnosis not present

## 2022-07-02 DIAGNOSIS — Y92009 Unspecified place in unspecified non-institutional (private) residence as the place of occurrence of the external cause: Secondary | ICD-10-CM | POA: Insufficient documentation

## 2022-07-02 DIAGNOSIS — M25561 Pain in right knee: Secondary | ICD-10-CM | POA: Diagnosis not present

## 2022-07-02 DIAGNOSIS — E041 Nontoxic single thyroid nodule: Secondary | ICD-10-CM | POA: Diagnosis not present

## 2022-07-02 DIAGNOSIS — S0990XA Unspecified injury of head, initial encounter: Secondary | ICD-10-CM | POA: Insufficient documentation

## 2022-07-02 DIAGNOSIS — F0390 Unspecified dementia without behavioral disturbance: Secondary | ICD-10-CM | POA: Diagnosis not present

## 2022-07-02 DIAGNOSIS — I10 Essential (primary) hypertension: Secondary | ICD-10-CM | POA: Diagnosis not present

## 2022-07-02 DIAGNOSIS — S80211A Abrasion, right knee, initial encounter: Secondary | ICD-10-CM | POA: Insufficient documentation

## 2022-07-02 LAB — BASIC METABOLIC PANEL
Anion gap: 8 (ref 5–15)
BUN: 19 mg/dL (ref 8–23)
CO2: 29 mmol/L (ref 22–32)
Calcium: 9.2 mg/dL (ref 8.9–10.3)
Chloride: 100 mmol/L (ref 98–111)
Creatinine, Ser: 1.06 mg/dL — ABNORMAL HIGH (ref 0.44–1.00)
GFR, Estimated: 50 mL/min — ABNORMAL LOW (ref >60)
Glucose, Bld: 97 mg/dL (ref 70–99)
Potassium: 4.1 mmol/L (ref 3.5–5.1)
Sodium: 137 mmol/L (ref 135–145)

## 2022-07-02 LAB — URINALYSIS, ROUTINE W REFLEX MICROSCOPIC
Bilirubin Urine: NEGATIVE
Glucose, UA: NEGATIVE mg/dL
Hgb urine dipstick: NEGATIVE
Ketones, ur: NEGATIVE mg/dL
Leukocytes,Ua: NEGATIVE
Nitrite: NEGATIVE
Protein, ur: NEGATIVE mg/dL
Specific Gravity, Urine: 1.017 (ref 1.005–1.030)
pH: 7 (ref 5.0–8.0)

## 2022-07-02 LAB — CBC
HCT: 45.8 % (ref 36.0–46.0)
Hemoglobin: 14.6 g/dL (ref 12.0–15.0)
MCH: 29.1 pg (ref 26.0–34.0)
MCHC: 31.9 g/dL (ref 30.0–36.0)
MCV: 91.4 fL (ref 80.0–100.0)
Platelets: 286 10*3/uL (ref 150–400)
RBC: 5.01 MIL/uL (ref 3.87–5.11)
RDW: 12.9 % (ref 11.5–15.5)
WBC: 9.7 10*3/uL (ref 4.0–10.5)
nRBC: 0 % (ref 0.0–0.2)

## 2022-07-02 NOTE — ED Provider Notes (Signed)
[facility] Provider Note    Event Date/Time   First MD Initiated Contact with Patient 07/02/22 1350     (approximate)   History   Fall   HPI  Diana Lester is a 89 y.o. female with history of hypertension, dementia,  and as listed in EMR presents to the emergency department for treatment and evaluation after unwitnessed fall.  She is a resident at home [facility].  She does not remember the fall.  Sister at bedside states that she is at her cognitive baseline.  Patient complains of skin tear to the right knee.      Physical Exam   Triage Vital Signs: ED Triage Vitals  Enc Vitals Group     BP 07/02/22 1239 (!) 126/97     Pulse Rate 07/02/22 1238 78     Resp 07/02/22 1238 20     Temp 07/02/22 1238 98 F (36.7 C)     Temp Source 07/02/22 1238 Oral     SpO2 07/02/22 1238 98 %     Weight --      Height --      Head Circumference --      Peak Flow --      Pain Score 07/02/22 1234 5     Pain Loc --      Pain Edu? --      Excl. in GC? --     Most recent vital signs: Vitals:   07/02/22 1239 07/02/22 1429  BP: (!) 126/97 (!) 154/65  Pulse:  75  Resp:  18  Temp:    SpO2:  97%    General: Awake, no distress.  CV:  Good peripheral perfusion.  Resp:  Normal effort.  Abd:  No distention.  Other:  No tenderness elicited with palpation over the length of the spine.  Patient able to demonstrate full flexion and extension of both upper and lower extremities without complaint of pain.  Abrasion noted over the right patella without active bleeding.   ED Results / Procedures / Treatments   Labs (all labs ordered are listed, but only abnormal results are displayed) Labs Reviewed  BASIC METABOLIC PANEL - Abnormal; Notable for the following components:      Result Value   Creatinine, Ser 1.06 (*)    GFR, Estimated 50 (*)    All other components within normal limits  URINALYSIS, ROUTINE W REFLEX MICROSCOPIC - Abnormal; Notable for the  following components:   Color, Urine YELLOW (*)    APPearance CLEAR (*)    All other components within normal limits  CBC     EKG  Not indicated.   RADIOLOGY  Image and radiology report reviewed and interpreted by me. Radiology report consistent with the same.  CT head and cervical spine negative for acute concerns.  Thyroid nodule was noted on image of the cervical spine.  PROCEDURES:  Critical Care performed: No  Procedures   MEDICATIONS ORDERED IN ED:  Medications - No data to display   IMPRESSION / MDM / ASSESSMENT AND PLAN / ED COURSE   I have reviewed the triage note.  Differential diagnosis includes, but is not limited to, head injury, cervical vertebral injury, knee contusion/abrasion  Patient's presentation is most consistent with acute presentation with potential threat to life or bodily function.  89 year old female presenting to the emergency department for treatment and evaluation after an unwitnessed fall.  See HPI for further details.  CT head and cervical spine are  negative for acute concerns but does mention a thyroid nodule.  This finding was discussed with the patient and her sister.  Sister is aware that she will need to schedule an appointment with her primary care provider for further evaluation..  Exam is reassuring.  She will be discharged back to the home [facility].  Sister has agreed to take her via POV.     FINAL CLINICAL IMPRESSION(S) / ED DIAGNOSES   Final diagnoses:  Minor head injury, initial encounter  Abrasion, right knee, initial encounter  Thyroid nodule     Rx / DC Orders   ED Discharge Orders     None        Note:  This document was prepared using Dragon voice recognition software and may include unintentional dictation errors.   Chinita Pester, FNP 07/02/22 1745    Jene Every, MD 07/02/22 [phone]

## 2022-07-02 NOTE — Discharge Instructions (Addendum)
Please see primary care for further evaluation of thyroid nodule.  Return to the ER for symptoms of concern.

## 2022-07-02 NOTE — ED Notes (Signed)
Called and gave report to [facility] and spoke with lynette about sister bringing pt back to facility.

## 2022-07-02 NOTE — ED Triage Notes (Signed)
Pt comes with c/o unwitnessed fall from [facility] [place]. Pt denies any thinners or loc. Pt has skin tear to right knee and abrasion to back of head.   Pt states right knee pain too

## 2022-07-08 ENCOUNTER — Telehealth: Payer: Self-pay

## 2022-07-08 DIAGNOSIS — F411 Generalized anxiety disorder: Secondary | ICD-10-CM | POA: Diagnosis not present

## 2022-07-08 DIAGNOSIS — G301 Alzheimer's disease with late onset: Secondary | ICD-10-CM | POA: Diagnosis not present

## 2022-07-08 DIAGNOSIS — F331 Major depressive disorder, recurrent, moderate: Secondary | ICD-10-CM | POA: Diagnosis not present

## 2022-07-08 NOTE — Telephone Encounter (Signed)
Transition Care Management Unsuccessful Follow-up Telephone Call  Date of discharge and from where:  [place] 4/26  Attempts:  1st Attempt  Reason for unsuccessful TCM follow-up call:  Unable to leave message   Lenard Forth [facility] Guide, [facility] [phone] [address] [place], [place], [place] [address] Phone: [phone] Email: [email or web address]

## 2022-07-09 ENCOUNTER — Telehealth: Payer: Self-pay

## 2022-07-09 NOTE — Telephone Encounter (Signed)
Transition Care Management Follow-up Telephone Call Date of discharge and from where: 4/26 Diana Lester  How have you been since you were released from the hospital? Doing ok Any questions or concerns? No  Items Reviewed: Did the pt receive and understand the discharge instructions provided? Yes  Medications obtained and verified? Yes  Other? No  Any new allergies since your discharge? No  Dietary orders reviewed? No Do you have support at home? Yes  ALF    Follow up appointments reviewed:  PCP Hospital f/u appt confirmed? Yes  Scheduled to see  on  @ . Specialist Hospital f/u appt confirmed?  Scheduled to see  on  @ . Are transportation arrangements needed?  If their condition worsens, is the pt aware to call PCP or go to the Emergency Dept.? Yes Was the patient provided with contact information for the PCP's office or ED? Yes Was to pt encouraged to call back with questions or concerns? Yes

## 2022-07-12 DIAGNOSIS — G301 Alzheimer's disease with late onset: Secondary | ICD-10-CM | POA: Diagnosis not present

## 2022-07-12 DIAGNOSIS — S0990XD Unspecified injury of head, subsequent encounter: Secondary | ICD-10-CM | POA: Diagnosis not present

## 2022-07-12 DIAGNOSIS — E559 Vitamin D deficiency, unspecified: Secondary | ICD-10-CM | POA: Diagnosis not present

## 2022-07-12 DIAGNOSIS — Z87891 Personal history of nicotine dependence: Secondary | ICD-10-CM | POA: Diagnosis not present

## 2022-07-12 DIAGNOSIS — Z9181 History of falling: Secondary | ICD-10-CM | POA: Diagnosis not present

## 2022-07-12 DIAGNOSIS — E782 Mixed hyperlipidemia: Secondary | ICD-10-CM | POA: Diagnosis not present

## 2022-07-12 DIAGNOSIS — I129 Hypertensive chronic kidney disease with stage 1 through stage 4 chronic kidney disease, or unspecified chronic kidney disease: Secondary | ICD-10-CM | POA: Diagnosis not present

## 2022-07-12 DIAGNOSIS — N1832 Chronic kidney disease, stage 3b: Secondary | ICD-10-CM | POA: Diagnosis not present

## 2022-07-12 DIAGNOSIS — E041 Nontoxic single thyroid nodule: Secondary | ICD-10-CM | POA: Diagnosis not present

## 2022-07-12 DIAGNOSIS — M545 Low back pain, unspecified: Secondary | ICD-10-CM | POA: Diagnosis not present

## 2022-07-12 DIAGNOSIS — F02A4 Dementia in other diseases classified elsewhere, mild, with anxiety: Secondary | ICD-10-CM | POA: Diagnosis not present

## 2022-07-12 DIAGNOSIS — F411 Generalized anxiety disorder: Secondary | ICD-10-CM | POA: Diagnosis not present

## 2022-07-15 DIAGNOSIS — M545 Low back pain, unspecified: Secondary | ICD-10-CM | POA: Diagnosis not present

## 2022-07-15 DIAGNOSIS — R269 Unspecified abnormalities of gait and mobility: Secondary | ICD-10-CM | POA: Diagnosis not present

## 2022-07-15 DIAGNOSIS — F411 Generalized anxiety disorder: Secondary | ICD-10-CM | POA: Diagnosis not present

## 2022-07-15 DIAGNOSIS — I129 Hypertensive chronic kidney disease with stage 1 through stage 4 chronic kidney disease, or unspecified chronic kidney disease: Secondary | ICD-10-CM | POA: Diagnosis not present

## 2022-07-15 DIAGNOSIS — N1832 Chronic kidney disease, stage 3b: Secondary | ICD-10-CM | POA: Diagnosis not present

## 2022-07-15 DIAGNOSIS — F02A4 Dementia in other diseases classified elsewhere, mild, with anxiety: Secondary | ICD-10-CM | POA: Diagnosis not present

## 2022-07-15 DIAGNOSIS — G301 Alzheimer's disease with late onset: Secondary | ICD-10-CM | POA: Diagnosis not present

## 2022-07-21 ENCOUNTER — Encounter: Payer: Self-pay | Admitting: Nurse Practitioner

## 2022-07-21 ENCOUNTER — Non-Acute Institutional Stay: Payer: PPO | Admitting: Nurse Practitioner

## 2022-07-21 DIAGNOSIS — R630 Anorexia: Secondary | ICD-10-CM | POA: Diagnosis not present

## 2022-07-21 DIAGNOSIS — Z515 Encounter for palliative care: Secondary | ICD-10-CM

## 2022-07-21 DIAGNOSIS — M47816 Spondylosis without myelopathy or radiculopathy, lumbar region: Secondary | ICD-10-CM | POA: Diagnosis not present

## 2022-07-21 DIAGNOSIS — G894 Chronic pain syndrome: Secondary | ICD-10-CM | POA: Diagnosis not present

## 2022-07-21 NOTE — Progress Notes (Addendum)
Therapist, nutritional Palliative Care Consult Note Telephone: [phone]  Fax: [phone]    Date of encounter: 07/21/22 8:10 PM PATIENT NAME: Diana Lester [facility] [address] [place] [address]   [phone] (home)  DOB: 12-28-1930 MRN: [id number] PRIMARY CARE PROVIDER:    [facility]  RESPONSIBLE PARTY:    Contact Information     Name Relation Home Work Mobile   Diana Lester ([place]) Sister   [phone]   Diana Lester Sister [phone]  [phone]   Diana Lester, Diana Lester [phone]       I met face to face with patient in facility. Palliative Care was asked to follow this patient by consultation request of  [facility] to address advance care planning and complex medical decision making. This is the initial visit.       ASSESSMENT AND PLAN / RECOMMENDATIONS:  Symptom Management/Plan: 1. Advance Care Planning;  Ongoing discussions   2. Chronic pain secondary to advanced degenerative disc disease at L1-2, L2-3, L3-4; severe central stenosis at L4-5; and advanced facet degenerative changes at L3-4, L4-5, and L5-S1. Updated pain management at length with Diana Lester, daughter Diana Lester and staff;    3. Anorexia, weight gain; reviewed weights, diet, encourage Diana Lester to eat, supplements, continue to monitor weights.  06/15/2021 weight 133 lbs 01/01/2022 weight 125 lbs 05/11/2022 weight 123.6 lbs 06/07/2022 weight 123.6 lbs 07/14/2022 weight 126 lbs   1 year ago PPS 60% Currently PPS low 50%   MRI which demonstrates advanced degenerative disc disease at L1-2, L2-3, L3-4; severe central stenosis at L4-5; and advanced facet degenerative changes at L3-4, L4-5, and L5-S1.    4. Palliative care encounter; Palliative care encounter; Palliative medicine team will continue to support patient, patient's family, and medical team. Visit consisted of counseling and education dealing with the complex and emotionally intense  issues of symptom management and palliative care in the setting of serious and potentially life-threatening illness   Follow up Palliative Care Visit: Palliative care will continue to follow for complex medical decision making, advance care planning, and clarification of goals. Return 2 to 8 weeks or prn.   I spent 45 minutes providing this consultation. More than 50% of the time in this consultation was spent in counseling and care coordination. PPS: 50%   Chief Complaint: Initial palliative consult for complex medical decision making, address goals, manage ongoing symptoms   HISTORY OF PRESENT ILLNESS:  Diana Lester is a 89 y.o. year old female  with multiple medical problems including CKD, HTN, angiomyolipoma of kidney, advanced degenerative disc disease at L1-2, L2-3, L3-4; severe central stenosis at L4-5; and advanced facet degenerative changes at L3-4, L4-5, and L5-S1, memory impairment, h/o breast cancer, HLD, h/o hyponatremia, depression, anxiety. Diana Lester resides at [facility], is ambulatory without recent falls. Requires assistance to ADL's, requiring more assistance with increase in back pain. Diana Lester does go to dining area to eat her meals, feeds herself with weight loss. Pain per staff/daughter has seemed to improve, functionally she has been participating in activities, going to dining area for meals, engaging and not complaining as much as previously, not sleeping as much. At present Diana Lester is sitting in the dining area, waiting for her lunch. We talked about how she has been feeling, pain has improved, she has been participating in activities, going to dining area for meals, functional improvement. Diana Lester endorses on occasion she does have back pain but current regimen has helped  and comfortable. We talked about weights, appetite continues to improve. We talked about quality of life, daily routine. Support provided. Sleeping well. Medications, poc, goc reviewed. I contacted  Lupita Leash, Diana Lester dtg, we talked about s/s, pain, appetite, concerns. Support provided. PC f/u visit further discussion monitor trends of appetite, weights, monitor for functional, cognitive decline with chronic disease progression, assess any active symptoms, supportive role.   History obtained from review of EMR, discussion with primary team, and interview with family, facility staff/caregiver and/or Diana. Toni Lester.  I reviewed available labs, medications, imaging, studies and related documents from the EMR.  Records reviewed and summarized above.    Physical Exam: General: frail appearing, thin, fragile female ENMT: oral mucous membranes moist CV: S1S2, RRR Pulmonary: breath sounds clear MSK: ambulatory; +muscle wasting Skin: warm and dry Neuro:  +generalized weakness Psych: non-anxious affect, A and O x 3 Thank you for the opportunity to participate in the care of Diana. Toni Lester. Please call our office at [phone] if we can be of additional assistance.   Lorcan Shelp Prince Rome, NP

## 2022-07-22 DIAGNOSIS — F331 Major depressive disorder, recurrent, moderate: Secondary | ICD-10-CM | POA: Diagnosis not present

## 2022-07-22 DIAGNOSIS — G301 Alzheimer's disease with late onset: Secondary | ICD-10-CM | POA: Diagnosis not present

## 2022-07-22 DIAGNOSIS — F411 Generalized anxiety disorder: Secondary | ICD-10-CM | POA: Diagnosis not present

## 2022-07-26 DIAGNOSIS — F411 Generalized anxiety disorder: Secondary | ICD-10-CM | POA: Diagnosis not present

## 2022-07-26 DIAGNOSIS — G301 Alzheimer's disease with late onset: Secondary | ICD-10-CM | POA: Diagnosis not present

## 2022-07-26 DIAGNOSIS — F02A4 Dementia in other diseases classified elsewhere, mild, with anxiety: Secondary | ICD-10-CM | POA: Diagnosis not present

## 2022-07-27 DIAGNOSIS — I10 Essential (primary) hypertension: Secondary | ICD-10-CM | POA: Diagnosis not present

## 2022-08-05 DIAGNOSIS — G301 Alzheimer's disease with late onset: Secondary | ICD-10-CM | POA: Diagnosis not present

## 2022-08-05 DIAGNOSIS — F411 Generalized anxiety disorder: Secondary | ICD-10-CM | POA: Diagnosis not present

## 2022-08-05 DIAGNOSIS — F331 Major depressive disorder, recurrent, moderate: Secondary | ICD-10-CM | POA: Diagnosis not present

## 2022-08-06 DIAGNOSIS — M6281 Muscle weakness (generalized): Secondary | ICD-10-CM | POA: Diagnosis not present

## 2022-08-06 DIAGNOSIS — N184 Chronic kidney disease, stage 4 (severe): Secondary | ICD-10-CM | POA: Diagnosis not present

## 2022-08-06 DIAGNOSIS — I739 Peripheral vascular disease, unspecified: Secondary | ICD-10-CM | POA: Diagnosis not present

## 2022-08-06 DIAGNOSIS — E785 Hyperlipidemia, unspecified: Secondary | ICD-10-CM | POA: Diagnosis not present

## 2022-08-06 DIAGNOSIS — M545 Low back pain, unspecified: Secondary | ICD-10-CM | POA: Diagnosis not present

## 2022-08-06 DIAGNOSIS — I10 Essential (primary) hypertension: Secondary | ICD-10-CM | POA: Diagnosis not present

## 2022-08-19 DIAGNOSIS — G301 Alzheimer's disease with late onset: Secondary | ICD-10-CM | POA: Diagnosis not present

## 2022-08-24 ENCOUNTER — Encounter: Payer: Self-pay | Admitting: Nurse Practitioner

## 2022-08-24 ENCOUNTER — Non-Acute Institutional Stay: Payer: PPO | Admitting: Nurse Practitioner

## 2022-08-24 DIAGNOSIS — Z515 Encounter for palliative care: Secondary | ICD-10-CM

## 2022-08-24 DIAGNOSIS — M47816 Spondylosis without myelopathy or radiculopathy, lumbar region: Secondary | ICD-10-CM

## 2022-08-24 DIAGNOSIS — R634 Abnormal weight loss: Secondary | ICD-10-CM | POA: Diagnosis not present

## 2022-08-24 DIAGNOSIS — G894 Chronic pain syndrome: Secondary | ICD-10-CM

## 2022-08-24 DIAGNOSIS — R630 Anorexia: Secondary | ICD-10-CM | POA: Diagnosis not present

## 2022-08-24 NOTE — Progress Notes (Signed)
Therapist, nutritional Palliative Care Consult Note Telephone: [phone]  Fax: [phone]    Date of encounter: 08/24/22 6:15 PM PATIENT NAME: Diana Lester [facility] [address] [place] [address]   [phone] (home)  DOB: 12/19/1930 MRN: [id number] PRIMARY CARE PROVIDER:   [facility] Dale Greenwood, MD,  [address] [place] [address] [phone]  RESPONSIBLE PARTY:    Contact Information     Name Relation Home Work [place] ([place]) Sister   [phone]   Hess,Martha Sister [phone]  [phone]   [place], [place] [phone]       I met face to face with patient in facility. Palliative Care was asked to follow this patient by consultation request of  [facility] to address advance care planning and complex medical decision making. This is the initial visit.       ASSESSMENT AND PLAN / RECOMMENDATIONS:  Symptom Management/Plan: 1. Advance Care Planning;  Ongoing discussions   2. Chronic pain secondary to advanced degenerative disc disease at L1-2, L2-3, L3-4; severe central stenosis at L4-5; and advanced facet degenerative changes at L3-4, L4-5, and L5-S1. Worsening pain, more difficulty, reviewed; Encourabe mobility, nutrition; Recommendation to increase cymbalta; updated daughter Annabelle Harman and Melonie Florida NP.    3. Anorexia, weight gain; reviewed weights, diet, encourage Diana [place] to eat, supplements, continue to monitor weights.  06/15/2021 weight 133 lbs 01/01/2022 weight 125 lbs 05/11/2022 weight 123.6 lbs 06/07/2022 weight 123.6 lbs 07/14/2022 weight 126 lbs 08/11/2022 weight 124.8 lbs   1 year ago PPS 60% Currently PPS low 50%   MRI which demonstrates advanced degenerative disc disease at L1-2, L2-3, L3-4; severe central stenosis at L4-5; and advanced facet degenerative changes at L3-4, L4-5, and L5-S1.    4. Palliative care encounter; Palliative  care encounter; Palliative medicine team will continue to support patient, patient's family, and medical team. Visit consisted of counseling and education dealing with the complex and emotionally intense issues of symptom management and palliative care in the setting of serious and potentially life-threatening illness   Follow up Palliative Care Visit: Palliative care will continue to follow for complex medical decision making, advance care planning, and clarification of goals. Return 2 to 8 weeks or prn.   I spent 44 minutes providing this consultation. More than 50% of the time in this consultation was spent in counseling and care coordination. PPS: 50%   Chief Complaint: Initial palliative consult for complex medical decision making, address goals, manage ongoing symptoms   HISTORY OF PRESENT ILLNESS:  Diana Lester is a 89 y.o. year old female  with multiple medical problems including CKD, HTN, angiomyolipoma of kidney, advanced degenerative disc disease at L1-2, L2-3, L3-4; severe central stenosis at L4-5; and advanced facet degenerative changes at L3-4, L4-5, and L5-S1, memory impairment, h/o breast cancer, HLD, h/o hyponatremia, depression, anxiety. Diana Lester resides at [facility], is ambulatory without recent falls. Requires assistance to ADL's, requiring more assistance with increase in back pain. Diana Lester does go to dining area to eat her meals, feeds herself with weight loss. Pain per staff/daughter has seemed to improve, functionally she has been participating in activities, going to dining area for meals, engaging and not complaining as much as previously, not sleeping as much. At present Diana Lester is sitting. We talked about ros, pain at length including pain regimen. We talked about daily routine, importance of mobility, socialization, participating in activities at the facility,  going to dining area for meals, positive motivation. We talked about alternative pain modalities. We talked  about residing at a facility. Talked about appetite, nutrition. Discussed role pc in poc. We talked about her daughter, Annabelle Harman. Support provided. Medications, poc, goc reviewed. PC f/u visit further discussion monitor trends of appetite, weights, monitor for functional, cognitive decline with chronic disease progression, assess any active symptoms, supportive role.   History obtained from review of EMR, discussion with primary team, and interview with family, facility staff/caregiver and/or Diana. Toni Lester.  I reviewed available labs, medications, imaging, studies and related documents from the EMR.  Records reviewed and summarized above.    Physical Exam: General: frail appearing, thin, fragile female ENMT: oral mucous membranes moist CV: S1S2, RRR Pulmonary: breath sounds clear Neuro:  +generalized weakness Psych: non-anxious affect, A and O x 3 Thank you for the opportunity to participate in the care of Diana. Toni Lester. Please call our office at [phone] if we can be of additional assistance.   Chameka Mcmullen Prince Rome, NP

## 2022-09-02 DIAGNOSIS — G301 Alzheimer's disease with late onset: Secondary | ICD-10-CM | POA: Diagnosis not present

## 2022-09-02 DIAGNOSIS — F331 Major depressive disorder, recurrent, moderate: Secondary | ICD-10-CM | POA: Diagnosis not present

## 2022-09-09 DIAGNOSIS — Z79899 Other long term (current) drug therapy: Secondary | ICD-10-CM | POA: Diagnosis not present

## 2022-09-09 DIAGNOSIS — I129 Hypertensive chronic kidney disease with stage 1 through stage 4 chronic kidney disease, or unspecified chronic kidney disease: Secondary | ICD-10-CM | POA: Diagnosis not present

## 2022-09-09 DIAGNOSIS — E785 Hyperlipidemia, unspecified: Secondary | ICD-10-CM | POA: Diagnosis not present

## 2022-09-09 DIAGNOSIS — N1832 Chronic kidney disease, stage 3b: Secondary | ICD-10-CM | POA: Diagnosis not present

## 2022-09-09 DIAGNOSIS — R2681 Unsteadiness on feet: Secondary | ICD-10-CM | POA: Diagnosis not present

## 2022-09-09 DIAGNOSIS — Z6822 Body mass index (BMI) 22.0-22.9, adult: Secondary | ICD-10-CM | POA: Diagnosis not present

## 2022-09-09 DIAGNOSIS — Z66 Do not resuscitate: Secondary | ICD-10-CM | POA: Diagnosis not present

## 2022-09-09 DIAGNOSIS — M545 Low back pain, unspecified: Secondary | ICD-10-CM | POA: Diagnosis not present

## 2022-09-09 DIAGNOSIS — I739 Peripheral vascular disease, unspecified: Secondary | ICD-10-CM | POA: Diagnosis not present

## 2022-09-16 DIAGNOSIS — G301 Alzheimer's disease with late onset: Secondary | ICD-10-CM | POA: Diagnosis not present

## 2022-09-16 DIAGNOSIS — F331 Major depressive disorder, recurrent, moderate: Secondary | ICD-10-CM | POA: Diagnosis not present

## 2022-09-17 DIAGNOSIS — F02A4 Dementia in other diseases classified elsewhere, mild, with anxiety: Secondary | ICD-10-CM | POA: Diagnosis not present

## 2022-09-17 DIAGNOSIS — F411 Generalized anxiety disorder: Secondary | ICD-10-CM | POA: Diagnosis not present

## 2022-09-17 DIAGNOSIS — G301 Alzheimer's disease with late onset: Secondary | ICD-10-CM | POA: Diagnosis not present

## 2022-10-04 DIAGNOSIS — R52 Pain, unspecified: Secondary | ICD-10-CM | POA: Diagnosis not present

## 2022-10-04 DIAGNOSIS — Z515 Encounter for palliative care: Secondary | ICD-10-CM | POA: Diagnosis not present

## 2022-10-04 DIAGNOSIS — R54 Age-related physical debility: Secondary | ICD-10-CM | POA: Diagnosis not present

## 2022-10-04 DIAGNOSIS — R630 Anorexia: Secondary | ICD-10-CM | POA: Diagnosis not present

## 2022-10-04 DIAGNOSIS — M5030 Other cervical disc degeneration, unspecified cervical region: Secondary | ICD-10-CM | POA: Diagnosis not present

## 2022-10-04 DIAGNOSIS — M5137 Other intervertebral disc degeneration, lumbosacral region: Secondary | ICD-10-CM | POA: Diagnosis not present

## 2022-10-14 DIAGNOSIS — F331 Major depressive disorder, recurrent, moderate: Secondary | ICD-10-CM | POA: Diagnosis not present

## 2022-10-14 DIAGNOSIS — G301 Alzheimer's disease with late onset: Secondary | ICD-10-CM | POA: Diagnosis not present

## 2022-10-21 DIAGNOSIS — R350 Frequency of micturition: Secondary | ICD-10-CM | POA: Diagnosis not present

## 2022-10-21 DIAGNOSIS — N390 Urinary tract infection, site not specified: Secondary | ICD-10-CM | POA: Diagnosis not present

## 2022-10-22 DIAGNOSIS — B351 Tinea unguium: Secondary | ICD-10-CM | POA: Diagnosis not present

## 2022-10-22 DIAGNOSIS — R238 Other skin changes: Secondary | ICD-10-CM | POA: Diagnosis not present

## 2022-10-22 DIAGNOSIS — L84 Corns and callosities: Secondary | ICD-10-CM | POA: Diagnosis not present

## 2022-10-22 DIAGNOSIS — R2689 Other abnormalities of gait and mobility: Secondary | ICD-10-CM | POA: Diagnosis not present

## 2022-10-22 DIAGNOSIS — M6281 Muscle weakness (generalized): Secondary | ICD-10-CM | POA: Diagnosis not present

## 2022-10-22 DIAGNOSIS — L989 Disorder of the skin and subcutaneous tissue, unspecified: Secondary | ICD-10-CM | POA: Diagnosis not present

## 2022-10-22 DIAGNOSIS — M722 Plantar fascial fibromatosis: Secondary | ICD-10-CM | POA: Diagnosis not present

## 2022-10-22 DIAGNOSIS — R262 Difficulty in walking, not elsewhere classified: Secondary | ICD-10-CM | POA: Diagnosis not present

## 2022-10-22 DIAGNOSIS — L600 Ingrowing nail: Secondary | ICD-10-CM | POA: Diagnosis not present

## 2022-10-28 DIAGNOSIS — F331 Major depressive disorder, recurrent, moderate: Secondary | ICD-10-CM | POA: Diagnosis not present

## 2022-11-03 DIAGNOSIS — Z515 Encounter for palliative care: Secondary | ICD-10-CM | POA: Diagnosis not present

## 2022-11-03 DIAGNOSIS — M5137 Other intervertebral disc degeneration, lumbosacral region: Secondary | ICD-10-CM | POA: Diagnosis not present

## 2022-11-03 DIAGNOSIS — R630 Anorexia: Secondary | ICD-10-CM | POA: Diagnosis not present

## 2022-11-03 DIAGNOSIS — R52 Pain, unspecified: Secondary | ICD-10-CM | POA: Diagnosis not present

## 2022-11-03 DIAGNOSIS — R54 Age-related physical debility: Secondary | ICD-10-CM | POA: Diagnosis not present

## 2022-11-03 DIAGNOSIS — M5030 Other cervical disc degeneration, unspecified cervical region: Secondary | ICD-10-CM | POA: Diagnosis not present

## 2022-12-24 DIAGNOSIS — B351 Tinea unguium: Secondary | ICD-10-CM | POA: Diagnosis not present

## 2022-12-24 DIAGNOSIS — M722 Plantar fascial fibromatosis: Secondary | ICD-10-CM | POA: Diagnosis not present

## 2022-12-24 DIAGNOSIS — R262 Difficulty in walking, not elsewhere classified: Secondary | ICD-10-CM | POA: Diagnosis not present

## 2022-12-24 DIAGNOSIS — L84 Corns and callosities: Secondary | ICD-10-CM | POA: Diagnosis not present

## 2022-12-24 DIAGNOSIS — M6281 Muscle weakness (generalized): Secondary | ICD-10-CM | POA: Diagnosis not present

## 2022-12-24 DIAGNOSIS — R2689 Other abnormalities of gait and mobility: Secondary | ICD-10-CM | POA: Diagnosis not present

## 2022-12-24 DIAGNOSIS — R238 Other skin changes: Secondary | ICD-10-CM | POA: Diagnosis not present

## 2022-12-24 DIAGNOSIS — L600 Ingrowing nail: Secondary | ICD-10-CM | POA: Diagnosis not present

## 2023-05-12 DIAGNOSIS — L0390 Cellulitis, unspecified: Secondary | ICD-10-CM | POA: Diagnosis not present

## 2023-05-12 DIAGNOSIS — N1832 Chronic kidney disease, stage 3b: Secondary | ICD-10-CM | POA: Diagnosis not present

## 2023-05-12 DIAGNOSIS — R2681 Unsteadiness on feet: Secondary | ICD-10-CM | POA: Diagnosis not present

## 2023-05-12 DIAGNOSIS — F02B4 Dementia in other diseases classified elsewhere, moderate, with anxiety: Secondary | ICD-10-CM | POA: Diagnosis not present

## 2023-05-12 DIAGNOSIS — M549 Dorsalgia, unspecified: Secondary | ICD-10-CM | POA: Diagnosis not present

## 2023-05-12 DIAGNOSIS — G309 Alzheimer's disease, unspecified: Secondary | ICD-10-CM | POA: Diagnosis not present

## 2023-05-13 DIAGNOSIS — R2681 Unsteadiness on feet: Secondary | ICD-10-CM | POA: Diagnosis not present

## 2023-05-13 DIAGNOSIS — L0390 Cellulitis, unspecified: Secondary | ICD-10-CM | POA: Diagnosis not present

## 2023-05-13 DIAGNOSIS — N1832 Chronic kidney disease, stage 3b: Secondary | ICD-10-CM | POA: Diagnosis not present

## 2023-05-13 DIAGNOSIS — G309 Alzheimer's disease, unspecified: Secondary | ICD-10-CM | POA: Diagnosis not present

## 2023-05-26 DIAGNOSIS — G309 Alzheimer's disease, unspecified: Secondary | ICD-10-CM | POA: Diagnosis not present

## 2023-05-26 DIAGNOSIS — F3341 Major depressive disorder, recurrent, in partial remission: Secondary | ICD-10-CM | POA: Diagnosis not present

## 2023-06-07 DEATH — deceased
